# Patient Record
Sex: Female | Born: 1940 | Race: White | Hispanic: No | Marital: Married | Smoking: Never smoker
Health system: Southern US, Community
[De-identification: ages and names within clinical notes are randomized; demographics above are authoritative.]

## PROBLEM LIST (undated history)

## (undated) DIAGNOSIS — M1990 Unspecified osteoarthritis, unspecified site: Secondary | ICD-10-CM

## (undated) DIAGNOSIS — Z803 Family history of malignant neoplasm of breast: Secondary | ICD-10-CM

## (undated) DIAGNOSIS — R002 Palpitations: Secondary | ICD-10-CM

## (undated) DIAGNOSIS — Z923 Personal history of irradiation: Secondary | ICD-10-CM

## (undated) DIAGNOSIS — I499 Cardiac arrhythmia, unspecified: Secondary | ICD-10-CM

## (undated) DIAGNOSIS — Z8041 Family history of malignant neoplasm of ovary: Secondary | ICD-10-CM

## (undated) DIAGNOSIS — C50919 Malignant neoplasm of unspecified site of unspecified female breast: Secondary | ICD-10-CM

## (undated) DIAGNOSIS — I480 Paroxysmal atrial fibrillation: Secondary | ICD-10-CM

## (undated) DIAGNOSIS — I10 Essential (primary) hypertension: Secondary | ICD-10-CM

## (undated) DIAGNOSIS — I5189 Other ill-defined heart diseases: Secondary | ICD-10-CM

## (undated) HISTORY — DX: Essential (primary) hypertension: I10

## (undated) HISTORY — PX: DILATION AND CURETTAGE OF UTERUS: SHX78

## (undated) HISTORY — DX: Paroxysmal atrial fibrillation: I48.0

## (undated) HISTORY — PX: BREAST SURGERY: SHX581

## (undated) HISTORY — DX: Malignant neoplasm of unspecified site of unspecified female breast: C50.919

## (undated) HISTORY — DX: Family history of malignant neoplasm of ovary: Z80.41

## (undated) HISTORY — PX: EYE SURGERY: SHX253

## (undated) HISTORY — PX: BREAST EXCISIONAL BIOPSY: SUR124

## (undated) HISTORY — DX: Other ill-defined heart diseases: I51.89

## (undated) HISTORY — DX: Family history of malignant neoplasm of breast: Z80.3

## (undated) HISTORY — PX: SALPINGOOPHORECTOMY: SHX82

## (undated) HISTORY — DX: Palpitations: R00.2

---

## 1999-09-11 HISTORY — PX: BREAST LUMPECTOMY: SHX2

## 2003-09-17 ENCOUNTER — Emergency Department (HOSPITAL_COMMUNITY): Admission: EM | Admit: 2003-09-17 | Discharge: 2003-09-17 | Payer: Self-pay | Admitting: Emergency Medicine

## 2012-02-22 ENCOUNTER — Telehealth: Payer: Self-pay | Admitting: *Deleted

## 2012-02-22 NOTE — Telephone Encounter (Signed)
Received request from Colman Cater to schedule Ashlee Flores mom Ashlee Flores for genetics on 6/18 at 10:00.  Called Ashlee Flores and confirmed mother's information and confirmed 02/26/12 genetic appt.

## 2012-02-26 ENCOUNTER — Encounter: Payer: Self-pay | Admitting: Genetic Counselor

## 2012-02-26 ENCOUNTER — Ambulatory Visit (HOSPITAL_BASED_OUTPATIENT_CLINIC_OR_DEPARTMENT_OTHER): Payer: Medicare Other | Admitting: Genetic Counselor

## 2012-02-26 ENCOUNTER — Other Ambulatory Visit: Payer: Medicare Other | Admitting: Lab

## 2012-02-26 DIAGNOSIS — C50919 Malignant neoplasm of unspecified site of unspecified female breast: Secondary | ICD-10-CM

## 2012-02-26 DIAGNOSIS — Z808 Family history of malignant neoplasm of other organs or systems: Secondary | ICD-10-CM

## 2012-02-26 DIAGNOSIS — IMO0002 Reserved for concepts with insufficient information to code with codable children: Secondary | ICD-10-CM

## 2012-02-26 DIAGNOSIS — D0590 Unspecified type of carcinoma in situ of unspecified breast: Secondary | ICD-10-CM

## 2012-02-26 DIAGNOSIS — Z803 Family history of malignant neoplasm of breast: Secondary | ICD-10-CM

## 2012-02-26 NOTE — Progress Notes (Signed)
Dr.  Danna Hefty requested a consultation for genetic counseling and risk assessment for Ashlee Flores, a 71 y.o. female, for discussion of her personal and family history of cancer. She presents to clinic today, with her daughter Harriett Sine, to discuss the possibility of a genetic predisposition to cancer, and to further clarify her risks, as well as her family members' risks for cancer.   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 This was treated with lumpectomy and radiation.    History  Substance Use Topics  . Smoking status: Not on file  . Smokeless tobacco: Not on file  . Alcohol Use: Not on file    REPRODUCTIVE HISTORY AND PERSONAL RISK ASSESSMENT FACTORS: Menarche was at age 69.   Menopause was at 50  Uterus Intact: Yes Ovaries Intact: No, removed prophylactically because of family history [id number] , first live birth at age 30  She has not previously undergone treatment for infertility.   OCP use for 3 years   She has used HRT in the past.    FAMILY HISTORY:  We obtained a detailed, 4-generation family history.  Significant diagnoses are listed below: The patient was diagnosed with breast cancer at age 55. She reports being tested for BRCA mutations in 2002 and was negative. Her daughter was diagnosed with a melanoma at age 61, her sister was diagnosed with stage IV ovarian cancer at age 10, and her mother was diagnosed with breast cancer at age 79.  The patient reports that her sister was also tested for BRCA mutations around the same time she was and tested negative. A maternal aunt was diagnosed with breast cancer at age 64 and died at 53, and her son has been diagnosed with prostate cancer.  The patient's maternal grandmother was diagnosed with what they think was ovarian cancer and died at age 75.  Her brother had a daughter with breast cancer.  The patient's father had a heart attack at age 42 and died.  He had one  sister who had breast cancer in her 58s, and her daughter had breast cancer and died in her 64s.  He also had a brother who had bone cancer and died in his 11s.  There is no other reported cancer on either side of the family.  Patient's maternal ancestors are of [place] descent, and paternal ancestors are of [place] descent. There is no reported Ashkenazi Jewish ancestry. There is no known consanguinity.  GENETIC COUNSELING RISK ASSESSMENT, DISCUSSION, AND SUGGESTED FOLLOW UP: We reviewed the natural history and genetic etiology of sporadic, familial and hereditary cancer syndromes.  About 5-10% of breast cancer is hereditary.  Of this, about 85% is the result of a BRCA1 or BRCA2 mutation.  We reviewed the red flags of hereditary cancer syndromes and the dominant inheritance patterns.  We discussed that since 2002, new technology has allowed [place] to perform different testing of BRCA1 looking for 5 rearrangements in that gene, as well as a large rearrangement test called BART.  Retesting her would allow [place] to ensure that all updated testing was performed.  If the BRCA testing is negative, we discussed that we could be testing for the wrong gene.  We discussed gene panels, and that several cancer genes that are associated with different cancers can be tested at the same time.  Because of the different types of cancer that are in the patient's family, we will consider one of the panel tests  if she is negative for BRCA mutations.  The patient's personal and family history is suggestive of the following possible diagnosis: hereditary cancer syndrome  We discussed that identification of a hereditary cancer syndrome may help her care providers tailor the patients medical management. If a mutation indicating hereditary cancer syndrome is detected in this case, the [employer] recommendations would include increased cancer surveillance and possible prophylactic surgery. If a mutation is  detected, the patient will be referred back to the referring provider and to any additional appropriate care providers to discuss the relevant options.   If a mutation is not found in the patient, her family history is still suggestive of a hereditary cancer syndrome as the explanation for her breast cancer, we just do not have the technology to test for it. Cancer surveillance options would be discussed for the patient according to the appropriate standard National Comprehensive Cancer Network and American Cancer Society guidelines, with consideration of their personal and family history risk factors. In this case, the patient will be referred back to their care providers for discussions of management.   After considering the risks, benefits, and limitations, the patient provided informed consent for  the following  testing: BRACAnalysis through [facility] and CancerNext through [employer]   Per the patient's request, we will contact her by telephone to discuss these results. A follow up genetic counseling visit will be scheduled if indicated.  The patient was seen for a total of 60 minutes, greater than 50% of which was spent face-to-face counseling.  This plan is being carried out per Dr. Danna Hefty recommendations.  This note will also be sent to the referring provider via the electronic medical record. The patient will be supplied with a summary of this genetic counseling discussion as well as educational information on the discussed hereditary cancer syndromes following the conclusion of their visit.   Patient was discussed with Dr. Drue Second.  CC BY [place] MAIL: Dr. Ardelle Lesches, [address] [place], [place]  [address]     _______________________________________________________________________ For Office Staff:  Number of people involved in session: 3 Was an Intern/ student involved with case: not applicable

## 2012-03-11 ENCOUNTER — Encounter: Payer: Self-pay | Admitting: Genetic Counselor

## 2012-04-11 ENCOUNTER — Telehealth: Payer: Self-pay | Admitting: Oncology

## 2012-04-11 NOTE — Telephone Encounter (Signed)
Genetic results mailed out.  °

## 2012-06-17 ENCOUNTER — Telehealth: Payer: Self-pay | Admitting: Genetic Counselor

## 2012-06-17 NOTE — Telephone Encounter (Signed)
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  She agreed for me to submit information to [facility] for family studies.

## 2012-07-22 ENCOUNTER — Telehealth: Payer: Self-pay | Admitting: Genetic Counselor

## 2012-07-22 NOTE — Telephone Encounter (Signed)
Left message about family studies application and asked her to CB.

## 2013-02-11 ENCOUNTER — Encounter: Payer: Self-pay | Admitting: Genetic Counselor

## 2013-10-19 DIAGNOSIS — I10 Essential (primary) hypertension: Secondary | ICD-10-CM | POA: Diagnosis not present

## 2013-10-19 DIAGNOSIS — Z79899 Other long term (current) drug therapy: Secondary | ICD-10-CM | POA: Diagnosis not present

## 2013-10-22 DIAGNOSIS — Z853 Personal history of malignant neoplasm of breast: Secondary | ICD-10-CM | POA: Diagnosis not present

## 2013-10-26 DIAGNOSIS — M19049 Primary osteoarthritis, unspecified hand: Secondary | ICD-10-CM | POA: Diagnosis not present

## 2013-10-26 DIAGNOSIS — IMO0001 Reserved for inherently not codable concepts without codable children: Secondary | ICD-10-CM | POA: Diagnosis not present

## 2013-12-03 DIAGNOSIS — H2510 Age-related nuclear cataract, unspecified eye: Secondary | ICD-10-CM | POA: Diagnosis not present

## 2013-12-03 DIAGNOSIS — H40039 Anatomical narrow angle, unspecified eye: Secondary | ICD-10-CM | POA: Diagnosis not present

## 2013-12-14 DIAGNOSIS — M19049 Primary osteoarthritis, unspecified hand: Secondary | ICD-10-CM | POA: Diagnosis not present

## 2013-12-14 DIAGNOSIS — IMO0001 Reserved for inherently not codable concepts without codable children: Secondary | ICD-10-CM | POA: Diagnosis not present

## 2014-02-08 DIAGNOSIS — I10 Essential (primary) hypertension: Secondary | ICD-10-CM | POA: Diagnosis not present

## 2014-02-08 DIAGNOSIS — R7309 Other abnormal glucose: Secondary | ICD-10-CM | POA: Diagnosis not present

## 2014-02-08 DIAGNOSIS — M62838 Other muscle spasm: Secondary | ICD-10-CM | POA: Diagnosis not present

## 2014-03-08 DIAGNOSIS — M949 Disorder of cartilage, unspecified: Secondary | ICD-10-CM | POA: Diagnosis not present

## 2014-03-08 DIAGNOSIS — M899 Disorder of bone, unspecified: Secondary | ICD-10-CM | POA: Diagnosis not present

## 2014-06-07 DIAGNOSIS — Z124 Encounter for screening for malignant neoplasm of cervix: Secondary | ICD-10-CM | POA: Diagnosis not present

## 2014-06-07 DIAGNOSIS — Z01419 Encounter for gynecological examination (general) (routine) without abnormal findings: Secondary | ICD-10-CM | POA: Diagnosis not present

## 2014-08-17 DIAGNOSIS — M8580 Other specified disorders of bone density and structure, unspecified site: Secondary | ICD-10-CM | POA: Diagnosis not present

## 2014-08-17 DIAGNOSIS — R7309 Other abnormal glucose: Secondary | ICD-10-CM | POA: Diagnosis not present

## 2014-08-17 DIAGNOSIS — I10 Essential (primary) hypertension: Secondary | ICD-10-CM | POA: Diagnosis not present

## 2014-08-17 DIAGNOSIS — Z23 Encounter for immunization: Secondary | ICD-10-CM | POA: Diagnosis not present

## 2014-10-28 DIAGNOSIS — R197 Diarrhea, unspecified: Secondary | ICD-10-CM | POA: Diagnosis not present

## 2014-10-28 DIAGNOSIS — Z79899 Other long term (current) drug therapy: Secondary | ICD-10-CM | POA: Diagnosis not present

## 2014-10-28 DIAGNOSIS — I10 Essential (primary) hypertension: Secondary | ICD-10-CM | POA: Diagnosis not present

## 2014-11-26 DIAGNOSIS — Z808 Family history of malignant neoplasm of other organs or systems: Secondary | ICD-10-CM | POA: Diagnosis not present

## 2014-11-26 DIAGNOSIS — L814 Other melanin hyperpigmentation: Secondary | ICD-10-CM | POA: Diagnosis not present

## 2014-11-26 DIAGNOSIS — L821 Other seborrheic keratosis: Secondary | ICD-10-CM | POA: Diagnosis not present

## 2014-11-26 DIAGNOSIS — I8310 Varicose veins of unspecified lower extremity with inflammation: Secondary | ICD-10-CM | POA: Diagnosis not present

## 2014-11-26 DIAGNOSIS — D225 Melanocytic nevi of trunk: Secondary | ICD-10-CM | POA: Diagnosis not present

## 2014-11-26 DIAGNOSIS — L609 Nail disorder, unspecified: Secondary | ICD-10-CM | POA: Diagnosis not present

## 2014-11-26 DIAGNOSIS — L570 Actinic keratosis: Secondary | ICD-10-CM | POA: Diagnosis not present

## 2014-11-26 DIAGNOSIS — D2270 Melanocytic nevi of unspecified lower limb, including hip: Secondary | ICD-10-CM | POA: Diagnosis not present

## 2015-01-06 ENCOUNTER — Telehealth: Payer: Self-pay | Admitting: Genetic Counselor

## 2015-01-06 NOTE — Telephone Encounter (Signed)
Ashlee Flores LM on my VM stating that her sister who had ovarian cancer tested positive for a RAD51C mutation.  Reviewed Ashlee Flores's genetic testing and it was not tested for when she had her OvaNext genetic testing.  She will try to obtain a copy of her sister's test results. I called back today to follow up on this previous conversation.  I LM on VM asking that she call back.

## 2015-01-10 ENCOUNTER — Ambulatory Visit (HOSPITAL_BASED_OUTPATIENT_CLINIC_OR_DEPARTMENT_OTHER): Payer: Medicare Other | Admitting: Genetic Counselor

## 2015-01-10 ENCOUNTER — Other Ambulatory Visit: Payer: Medicare Other

## 2015-01-10 ENCOUNTER — Encounter: Payer: Self-pay | Admitting: Genetic Counselor

## 2015-01-10 DIAGNOSIS — Z315 Encounter for genetic counseling: Secondary | ICD-10-CM | POA: Diagnosis present

## 2015-01-10 DIAGNOSIS — Z803 Family history of malignant neoplasm of breast: Secondary | ICD-10-CM | POA: Diagnosis not present

## 2015-01-10 DIAGNOSIS — Z8041 Family history of malignant neoplasm of ovary: Secondary | ICD-10-CM | POA: Diagnosis not present

## 2015-01-10 DIAGNOSIS — Z853 Personal history of malignant neoplasm of breast: Secondary | ICD-10-CM

## 2015-01-10 DIAGNOSIS — C50919 Malignant neoplasm of unspecified site of unspecified female breast: Secondary | ICD-10-CM | POA: Insufficient documentation

## 2015-01-10 NOTE — Progress Notes (Signed)
REFERRING PROVIDER: Jonathon Jordan, MD  PRIMARY PROVIDER:  No primary care provider on file.  PRIMARY REASON FOR VISIT:  1. History of breast cancer   2. Family history of ovarian cancer   3. Family history of breast cancer      HISTORY OF PRESENT ILLNESS:   Ashlee Flores, a 74 y.o. female, was seen for a [facility] cancer genetics consultation at the request of Dr. Stephanie Acre due to a personal and family history of cancer.  Ashlee Flores presents to clinic today to discuss the possibility of a hereditary predisposition to cancer, genetic testing, and to further clarify her future cancer risks, as well as potential cancer risks for family members.   In 2001, at the age of 29, Ashlee Flores was diagnosed with DCIS of the breast. This was treated with lumpectomy and radiation.  She underwent genetic testing for BRCA mutations and was negative.  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  Her sister recently underwent a [facility] panel test and was found to have a RAD51D mutation.  This gene was not on the OvaNext panel test in 2013, although it is now.  Ashlee Flores wants to undergo targeted testing for the RAD51D mutation found in her sister.   CANCER HISTORY:   No history exists.     HORMONAL RISK FACTORS:  Menarche was at age 53.  First live birth at age 18.  OCP use for approximately 3 years.  Ovaries intact: no.  Hysterectomy: yes.  Menopausal status: postmenopausal.  HRT use: 1 years. Any excessive radiation exposure in the past:  Patient had radiation treatment for her previous breast cancer.  Past Medical History  Diagnosis Date  . Breast cancer     DCIS breast cancer at 76  . Family history of ovarian cancer   . Family history of breast cancer     Past Surgical History  Procedure Laterality Date  . Salpingoophorectomy      preventative, due to [place] of ovarian cancer    History   Social History  .  Marital Status: Married    Spouse Name: N/A  . Number of Children: 4  . Years of Education: N/A   Social History Main Topics  . Smoking status: Never Smoker   . Smokeless tobacco: Not on file  . Alcohol Use: Yes  . Drug Use: Not on file  . Sexual Activity: Not on file   Other Topics Concern  . None   Social History Narrative     FAMILY HISTORY:  We obtained a detailed, 4-generation family history.  Significant diagnoses are listed below: Family History  Problem Relation Age of Onset  . Breast cancer Mother 34  . Heart attack Father   . Ovarian cancer Sister 65  . Breast cancer Paternal Aunt     dx in her 14s  . Ovarian cancer Maternal Grandmother     died at 37  . Melanoma Daughter 32    on leg  . Breast cancer Cousin     dx in her 11s  . Breast cancer Maternal Aunt 65  . Heart attack Maternal Uncle   . Bone cancer Paternal Uncle   . Heart attack Maternal Grandfather   . Heart attack Maternal Aunt   . Prostate cancer Cousin 29   The patient's family history has not changed.  Ashlee Flores has four children, her daughter was dx with melanoma at 56.  Ashlee Flores sister was daignosed with ovarian cancer at 51.  Her mother was diagnosed with breast cancer at 50  Her mother had two sisters and a brother.  One sister had breast cancer at 3.  Ashlee Flores maternal grandmother had ovarian cancer and her grandfather's sister (who married her grandmother's brother) had breast cancer.  There is also cancer in the children of the great aunt and uncle.  Ashlee Flores father had three sisters and two brothers.  One sister had breast cancer in her 76s and had a daughter with breast cancer in her 26s.  One brother had bone cancer. Patient's maternal ancestors are of [place] descent, and paternal ancestors are of [place] descent. There is no reported Ashkenazi Jewish ancestry. There is no known consanguinity.  GENETIC COUNSELING ASSESSMENT: Ashlee Flores is a 74 y.o. 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 We, therefore, discussed and recommended the following at today's visit.   DISCUSSION: We reviewed the characteristics, features and inheritance patterns of hereditary cancer syndromes. We discussed that RAD51D is a known ovarian cancer syndrome gene, that increases the risk for ovarian cancer.  In some families we also see breast cancer, but we do not have specific risks for breast cancer. The NCCN guidelines were updated this year and have included RAD51D as a gene where mutations increase the risk enough to discuss risk reducing BSO in carriers.  Therefore, if Ashlee Flores tests positive we would have guidelines in order to base our recommendations upon.  Based on her sister's mutation, Ashlee Flores has a 50% chance of also testing positive for the RAD51D mutation.  We also discussed genetic testing, including the appropriate family members to test, the process of testing, insurance coverage and turn-around-time for results. Based on her insurance, it is unknown if they will cover targeted testing for the RAD51D mutation. We discussed the implications of a negative, positive and/or variant of uncertain significant result. We recommended Ashlee Flores pursue genetic testing for the familial RAD51D mutation called [id number].   PLAN: After considering the risks, benefits, and limitations, Ashlee Flores  provided informed consent to pursue genetic testing and the blood sample was sent to [facility] for analysis of the single site RAD51D familial mutation. Results should be available within approximately 7-10 days' time, at which point they will be disclosed by telephone to Ashlee Flores, as will any additional recommendations warranted by these results. Ashlee Flores will receive a summary of her genetic counseling visit and a copy of her  results once available. This information will also be available in Epic. We encouraged Ashlee Flores to remain in contact with cancer genetics annually so that we can continuously update the family history and inform her of any changes in cancer genetics and testing that may be of benefit for her family. Ms. Lafosse questions were answered to her satisfaction today. Our contact information was provided should additional questions or concerns arise.  Lastly, we encouraged Ms. Cotrell to remain in contact with cancer genetics annually so that we can continuously update the family history and inform her of any changes in cancer genetics and testing that may be of benefit for this family.   Ms.  Carreras questions were answered to her satisfaction today. Our contact information was provided should additional questions or concerns arise. Thank you for the referral and allowing [place] to share in the  care of your patient.   Merla Sawka P. Florene Glen, [place], [facility] Certified Genetic Counselor [email or web address] phone: [phone]  The patient was seen for a total of 30 minutes in face-to-face genetic counseling.  This patient was discussed with Drs. Magrinat, Lindi Adie and/or Burr Medico who agrees with the above.    _______________________________________________________________________ For Office Staff:  Number of people involved in session: 1 Was an Intern/ student involved with case: no

## 2015-01-21 ENCOUNTER — Encounter: Payer: Self-pay | Admitting: Genetic Counselor

## 2015-01-21 ENCOUNTER — Telehealth: Payer: Self-pay | Admitting: Genetic Counselor

## 2015-01-21 DIAGNOSIS — C50919 Malignant neoplasm of unspecified site of unspecified female breast: Secondary | ICD-10-CM

## 2015-01-21 DIAGNOSIS — Z803 Family history of malignant neoplasm of breast: Secondary | ICD-10-CM

## 2015-01-21 DIAGNOSIS — Z1379 Encounter for other screening for genetic and chromosomal anomalies: Secondary | ICD-10-CM | POA: Insufficient documentation

## 2015-01-21 DIAGNOSIS — Z8041 Family history of malignant neoplasm of ovary: Secondary | ICD-10-CM

## 2015-01-21 NOTE — Telephone Encounter (Signed)
Revealed negative genetic testing on the targeted RAD51D mutation.  Explained that she did not have the mutation found in her sister.  Patient voiced her understanding.

## 2015-01-21 NOTE — Progress Notes (Signed)
HPI: Ashlee Flores was previously seen in the [facility] clinic due to a personal and family history of cancer and concerns regarding a hereditary predisposition to cancer. Please refer to our prior cancer genetics clinic note for more information regarding Ashlee Flores's medical, social and family histories, and our assessment and recommendations, at the time. Ashlee Flores recent genetic test results were disclosed to her, as were recommendations warranted by these results. These results and recommendations are discussed in more detail below.  GENETIC TEST RESULTS: We recommended Ashlee Flores pursue testing for the familial hereditary cancer gene mutation called RAD51D, c.[id number]. Ashlee Flores test was normal and did not reveal the familial mutation. We call this result a true negative result because the cancer-causing mutation was identified in Ashlee Flores's family, and she did not inherit it.  Given this negative result, Ashlee Flores chances of developing breast/ovarian-related cancers are the same as they are in the general population.    ADDITIONAL GENETIC TESTING: We discussed with Ashlee Flores that there are other genes that are associated with increased cancer risk that can be analyzed. In 2013 she had large panel testing through [facility] called OvaNext.  At that time, the OvaNext gene panel offered by [facility] and includes sequencing and rearrangement analysis for the following 21 genes: ATM, BARD1, BRCA1, BRCA2, BRIP1, CDH1, CHEK2, EPCAM, MLH1, MRE11A, MSH2, MSH6, MUTYH, NBN, PALB2, PMS2, PTEN, RAD50, RAD51C, STK11, and TP53.    CANCER SCREENING RECOMMENDATIONS:This result is reassuring and indicates that Ashlee Flores likely does not have an increased risk for a future cancer due to a mutation in one of these genes. This normal test also suggests that Ashlee Flores cancer was most likely not due to an inherited predisposition associated with one of these genes.  Most cancers  happen by chance and this negative test suggests that her cancer falls into this category.  We, therefore, recommended she continue to follow the cancer management and screening guidelines provided by her oncology and primary healthcare provider.   RECOMMENDATIONS FOR FAMILY MEMBERS: Women in this family might be at some increased risk of developing cancer, over the general population risk, simply due to the family history of cancer. We recommended women in this family have a yearly mammogram beginning at age 60, or 25 years younger than the earliest onset of cancer, an an annual clinical breast exam, and perform monthly breast self-exams. Women in this family should also have a gynecological exam as recommended by their primary provider. All family members should have a colonoscopy by age 17.  FOLLOW-UP: Lastly, we discussed with Ashlee Flores that cancer genetics is a rapidly advancing field and it is possible that new genetic tests will be appropriate for her and/or her family members in the future. We encouraged her to remain in contact with cancer genetics on an annual basis so we can update her personal and family histories and let her know of advances in cancer genetics that may benefit this family.   Our contact number was provided. Ashlee Flores questions were answered to her satisfaction, and she knows she is welcome to call us at anytime with additional questions or concerns.   Roma Kayser, MS, [facility] Certified Genetic Counselor [email or web address]

## 2015-02-08 ENCOUNTER — Telehealth: Payer: Self-pay

## 2015-02-08 NOTE — Telephone Encounter (Signed)
Genetic results dtd 01/21/15 rcvd from [facility].  Reviewed by Dr. Lindi Adie.  Sent to scan.

## 2015-02-24 DIAGNOSIS — R109 Unspecified abdominal pain: Secondary | ICD-10-CM | POA: Diagnosis not present

## 2015-02-24 DIAGNOSIS — R197 Diarrhea, unspecified: Secondary | ICD-10-CM | POA: Diagnosis not present

## 2015-02-24 DIAGNOSIS — Z8041 Family history of malignant neoplasm of ovary: Secondary | ICD-10-CM | POA: Diagnosis not present

## 2015-03-01 DIAGNOSIS — Z1211 Encounter for screening for malignant neoplasm of colon: Secondary | ICD-10-CM | POA: Diagnosis not present

## 2015-03-07 ENCOUNTER — Other Ambulatory Visit: Payer: Self-pay | Admitting: Family Medicine

## 2015-03-07 DIAGNOSIS — R1084 Generalized abdominal pain: Secondary | ICD-10-CM

## 2015-03-22 ENCOUNTER — Ambulatory Visit
Admission: RE | Admit: 2015-03-22 | Discharge: 2015-03-22 | Disposition: A | Discharge status: Home / Self Care | Payer: Medicare Other | Source: Ambulatory Visit | Attending: Family Medicine | Admitting: Family Medicine

## 2015-03-22 DIAGNOSIS — R1084 Generalized abdominal pain: Secondary | ICD-10-CM

## 2015-03-22 DIAGNOSIS — Z853 Personal history of malignant neoplasm of breast: Secondary | ICD-10-CM | POA: Diagnosis not present

## 2015-03-22 DIAGNOSIS — R109 Unspecified abdominal pain: Secondary | ICD-10-CM | POA: Diagnosis not present

## 2015-03-22 MED ORDER — IOPAMIDOL (ISOVUE-300) INJECTION 61%
100.0000 mL | Freq: Once | INTRAVENOUS | Status: AC | PRN
  Administered 2015-03-22: 100 mL via INTRAVENOUS

## 2015-06-03 DIAGNOSIS — R197 Diarrhea, unspecified: Secondary | ICD-10-CM | POA: Diagnosis not present

## 2015-06-03 DIAGNOSIS — R194 Change in bowel habit: Secondary | ICD-10-CM | POA: Diagnosis not present

## 2015-06-09 ENCOUNTER — Other Ambulatory Visit: Payer: Self-pay | Admitting: Obstetrics & Gynecology

## 2015-06-09 ENCOUNTER — Other Ambulatory Visit (HOSPITAL_COMMUNITY)
Admission: RE | Admit: 2015-06-09 | Discharge: 2015-06-09 | Disposition: A | Discharge status: Home / Self Care | Payer: Medicare Other | Source: Ambulatory Visit | Attending: Obstetrics & Gynecology | Admitting: Obstetrics & Gynecology

## 2015-06-09 DIAGNOSIS — A630 Anogenital (venereal) warts: Secondary | ICD-10-CM | POA: Diagnosis not present

## 2015-06-09 DIAGNOSIS — F418 Other specified anxiety disorders: Secondary | ICD-10-CM | POA: Diagnosis not present

## 2015-06-09 DIAGNOSIS — R87810 Cervical high risk human papillomavirus (HPV) DNA test positive: Secondary | ICD-10-CM | POA: Insufficient documentation

## 2015-06-09 DIAGNOSIS — Z1151 Encounter for screening for human papillomavirus (HPV): Secondary | ICD-10-CM | POA: Diagnosis present

## 2015-06-09 DIAGNOSIS — Z1371 Encounter for nonprocreative screening for genetic disease carrier status: Secondary | ICD-10-CM | POA: Diagnosis not present

## 2015-06-09 DIAGNOSIS — R87619 Unspecified abnormal cytological findings in specimens from cervix uteri: Secondary | ICD-10-CM | POA: Diagnosis present

## 2015-06-09 DIAGNOSIS — Z9189 Other specified personal risk factors, not elsewhere classified: Secondary | ICD-10-CM | POA: Diagnosis not present

## 2015-06-09 DIAGNOSIS — Z90722 Acquired absence of ovaries, bilateral: Secondary | ICD-10-CM | POA: Diagnosis not present

## 2015-06-09 DIAGNOSIS — Z853 Personal history of malignant neoplasm of breast: Secondary | ICD-10-CM | POA: Diagnosis not present

## 2015-06-10 LAB — CYTOLOGY - PAP

## 2015-07-12 DIAGNOSIS — Z23 Encounter for immunization: Secondary | ICD-10-CM | POA: Diagnosis not present

## 2015-07-14 ENCOUNTER — Other Ambulatory Visit: Payer: Self-pay | Admitting: Gastroenterology

## 2015-07-14 DIAGNOSIS — K5520 Angiodysplasia of colon without hemorrhage: Secondary | ICD-10-CM | POA: Diagnosis not present

## 2015-07-14 DIAGNOSIS — K640 First degree hemorrhoids: Secondary | ICD-10-CM | POA: Diagnosis not present

## 2015-07-14 DIAGNOSIS — K5730 Diverticulosis of large intestine without perforation or abscess without bleeding: Secondary | ICD-10-CM | POA: Diagnosis not present

## 2015-07-14 DIAGNOSIS — R197 Diarrhea, unspecified: Secondary | ICD-10-CM | POA: Diagnosis not present

## 2015-10-07 DIAGNOSIS — I10 Essential (primary) hypertension: Secondary | ICD-10-CM | POA: Diagnosis not present

## 2015-10-07 DIAGNOSIS — K760 Fatty (change of) liver, not elsewhere classified: Secondary | ICD-10-CM | POA: Diagnosis not present

## 2015-11-29 DIAGNOSIS — D1801 Hemangioma of skin and subcutaneous tissue: Secondary | ICD-10-CM | POA: Diagnosis not present

## 2015-11-29 DIAGNOSIS — Z808 Family history of malignant neoplasm of other organs or systems: Secondary | ICD-10-CM | POA: Diagnosis not present

## 2015-11-29 DIAGNOSIS — L821 Other seborrheic keratosis: Secondary | ICD-10-CM | POA: Diagnosis not present

## 2015-11-29 DIAGNOSIS — D2270 Melanocytic nevi of unspecified lower limb, including hip: Secondary | ICD-10-CM | POA: Diagnosis not present

## 2015-11-29 DIAGNOSIS — D225 Melanocytic nevi of trunk: Secondary | ICD-10-CM | POA: Diagnosis not present

## 2015-11-29 DIAGNOSIS — L814 Other melanin hyperpigmentation: Secondary | ICD-10-CM | POA: Diagnosis not present

## 2015-11-29 DIAGNOSIS — Z23 Encounter for immunization: Secondary | ICD-10-CM | POA: Diagnosis not present

## 2016-01-02 DIAGNOSIS — I10 Essential (primary) hypertension: Secondary | ICD-10-CM | POA: Diagnosis not present

## 2016-04-16 DIAGNOSIS — I499 Cardiac arrhythmia, unspecified: Secondary | ICD-10-CM | POA: Diagnosis not present

## 2016-04-25 ENCOUNTER — Telehealth: Payer: Self-pay | Admitting: Cardiovascular Disease

## 2016-04-25 NOTE — Telephone Encounter (Signed)
Received records from [place] for appointment on 06/04/16 with Dr Oval Linsey.  Records given to [facility] (medical records) for Dr Blenda Mounts schedule on 06/04/16. lp

## 2016-06-01 ENCOUNTER — Encounter: Payer: Self-pay | Admitting: Cardiovascular Disease

## 2016-06-02 NOTE — Progress Notes (Signed)
Cardiology Office Note   Date:  06/04/2016   ID:  Ashlee Flores, DOB 17-Feb-1941, MRN [id number]  PCP:  No primary care provider on file.  Cardiologist:   Skeet Latch, MD   Chief Complaint  Patient presents with  . New Patient (Initial Visit)    cramping in legs at night. Pt experienced rapid heartbeat w/ elevated BP back in July.    History of Present Illness: Ashlee Flores is a 75 y.o. female with hypertension who presents for an evaluation of palpitations. Ashlee Flores saw Ashlee Flores on 04/16/16. At that time she reported an episode of irregular heart rate that occurred while resting.  She was watching television at the end of July when she developed a sudden onset of irregular heart rhythm. She checked her blood pressure and heart rate repeatedly and her heart rate ranged from the 70s to 1:15. Her blood pressure ranged from 92-128/67-103. There is no associated lightheadedness, dizziness, chest pain, or shortness of breath.  This episode persisted for several hours and she was better until it subsided. At the follow-up appointment with Ashlee Flores her EKG was reportedly unremarkable. Laboratory testing revealed normal blood counts.  A comprehensive metabolic panel and thyroid function were also checked, but these labs are not available at this time.  She endorses shortness episodes of irregular heart rhythms and has none that lasted this long.   Ashlee Flores is very physically active.  She exercises by walking 2 miles most days of the week. She also participates in [place] and tai chi. She has no exertional chest pain or shortness of breath with these activities. She has not noted any lower extremity edema, orthopnea, or PND. She drinks one cup of coffee daily. She notes that on the day of her episode she had 2 or 3 glasses of wine instead of her typical one glass that evening.  She has been under some stress lately, as she recently remarried after being married for 46 years.  She  notes that her blood pressure is typically well-controlled.  Past Medical History:  Diagnosis Date  . 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 Essential hypertension 06/04/2016  . Family history of breast cancer   . Family history of ovarian cancer   . Palpitations 06/04/2016    Past Surgical History:  Procedure Laterality Date  . SALPINGOOPHORECTOMY     preventative, due to [place] of ovarian cancer     Current Outpatient Prescriptions  Medication Sig Dispense Refill  . hydrochlorothiazide (HYDRODIURIL) 25 MG tablet     . lisinopril (PRINIVIL,ZESTRIL) 10 MG tablet      No current facility-administered medications for this visit.     Allergies:   Penicillins    Social History:  The patient  reports that she has never smoked. She has never used smokeless tobacco. She reports that she drinks alcohol. She reports that she does not use drugs.   Family History:  The patient's family history includes Bone cancer in her paternal uncle; Breast cancer in her cousin and paternal aunt; Breast cancer (age of onset: 24) in her maternal aunt; Breast cancer (age of onset: 83) in her mother; Heart attack in her father, maternal aunt, maternal grandfather, and maternal uncle; Hypertension in her mother; Melanoma (age of onset: 67) in her daughter; Ovarian cancer in her maternal grandmother; Ovarian cancer (age of onset: 71) in her sister; Prostate cancer (age of onset: 23) in her cousin.    ROS:  Please see the history of present illness.   Otherwise, review of systems are positive for none.   All other systems are reviewed and negative.    PHYSICAL EXAM: VS:  BP (!) 144/74   Pulse (!) 58   Ht _0  (1.6 m)   Wt 130 lb 3.2 oz (59.1 kg)   BMI 23.06 kg/m  , BMI Body mass index is 23.06 kg/m. GENERAL:  Well appearing HEENT:  Pupils equal round and reactive, fundi not visualized, oral mucosa unremarkable NECK:  No jugular venous distention, waveform within normal limits, carotid  upstroke brisk and symmetric, no bruits, no thyromegaly LYMPHATICS:  No cervical adenopathy LUNGS:  Clear to auscultation bilaterally HEART:  RRR.  PMI not displaced or sustained,S1 and S2 within normal limits, no S3, no S4, no clicks, no rubs, no murmurs ABD:  Flat, positive bowel sounds normal in frequency in pitch, no bruits, no rebound, no guarding, no midline pulsatile mass, no hepatomegaly, no splenomegaly EXT:  2 plus pulses throughout, no edema, no cyanosis no clubbing SKIN:  No rashes no nodules NEURO:  Cranial nerves II through XII grossly intact, motor grossly intact throughout PSYCH:  Cognitively intact, oriented to person place and time    EKG:  EKG is ordered today. The ekg ordered today demonstrates sinus bradycardia rate 58 bpm.     Recent Labs: No results found for requested labs within last 8760 hours.   04/16/16: WBC 4.6, hemoglobin 14.6, hematocrit 42, platelets 212  Lipid Panel No results found for: CHOL, TRIG, HDL, CHOLHDL, VLDL, LDLCALC, LDLDIRECT    Wt Readings from Last 3 Encounters:  06/04/16 130 lb 3.2 oz (59.1 kg)      ASSESSMENT AND PLAN:  # Palpitations: Ms. Macchia reports one evening of palpitations.  Given her report that her heart rate changed Drastically and rapidly from the 60s to the 120s, it is concerning that she may have had atrial fibrillation. She has not had any significant episodes since that time. We will obtain a 30 day event monitor to better evaluate her symptoms. For now, she will continue taking aspirin 81 mg daily. Her heart rate is too low to be on any nodal agents.  She is getting ready to go on an international trip and will return October 12. She will wear her monitor after that time.  Thyroid function and basic metabolic panel will reportedly normal with Ashlee Flores.  # Hypertension: Blood pressure slightly above goal today, but has been well-controlled and even low at home. Therefore, we will not make any changes at this  time.  Current medicines are reviewed at length with the patient today.  The patient does not have concerns regarding medicines.  The following changes have been made:  no change  Labs/ tests ordered today include: Don't think so   Orders Placed This Encounter  Procedures  . Cardiac event monitor  . EKG 12-Lead     Disposition:   FU with Ashlee Antonson C. Oval Linsey, MD, [facility] in 2 months    This note was written with the assistance of speech recognition software.  Please excuse any transcriptional errors.  Signed, Ashlee Wedekind C. Oval Linsey, MD, [facility]  06/04/2016 9:04 AM    [place]

## 2016-06-04 ENCOUNTER — Ambulatory Visit (INDEPENDENT_AMBULATORY_CARE_PROVIDER_SITE_OTHER): Payer: Medicare Other | Admitting: Cardiovascular Disease

## 2016-06-04 ENCOUNTER — Encounter: Payer: Self-pay | Admitting: Cardiovascular Disease

## 2016-06-04 VITALS — BP 144/74 | HR 58 | Ht 63.0 in | Wt 130.2 lb

## 2016-06-04 DIAGNOSIS — I10 Essential (primary) hypertension: Secondary | ICD-10-CM

## 2016-06-04 DIAGNOSIS — R002 Palpitations: Secondary | ICD-10-CM | POA: Diagnosis not present

## 2016-06-04 DIAGNOSIS — I499 Cardiac arrhythmia, unspecified: Secondary | ICD-10-CM | POA: Diagnosis not present

## 2016-06-04 HISTORY — DX: Essential (primary) hypertension: I10

## 2016-06-04 HISTORY — DX: Palpitations: R00.2

## 2016-06-04 NOTE — Patient Instructions (Addendum)
Medication Instructions:  Your physician recommends that you continue on your current medications as directed. Please refer to the Current Medication list given to you today.  Labwork: none  Testing/Procedures: Your physician has recommended that you wear an event monitor. Event monitors are medical devices that record the heart's electrical activity. Doctors most often [place] these monitors to diagnose arrhythmias. Arrhythmias are problems with the speed or rhythm of the heartbeat. The monitor is a small, portable device. You can wear one while you do your normal daily activities. This is usually used to diagnose what is causing palpitations/syncope (passing out). 30 day  Follow-Up: Your physician recommends that you schedule a follow-up appointment in: 2 month ov  If you need a refill on your cardiac medications before your next appointment, please call your pharmacy.

## 2016-06-12 ENCOUNTER — Other Ambulatory Visit (HOSPITAL_COMMUNITY)
Admission: RE | Admit: 2016-06-12 | Discharge: 2016-06-12 | Disposition: A | Discharge status: Home / Self Care | Payer: Medicare Other | Source: Ambulatory Visit | Attending: Obstetrics and Gynecology | Admitting: Obstetrics and Gynecology

## 2016-06-12 ENCOUNTER — Other Ambulatory Visit: Payer: Self-pay | Admitting: Obstetrics & Gynecology

## 2016-06-12 DIAGNOSIS — B977 Papillomavirus as the cause of diseases classified elsewhere: Secondary | ICD-10-CM | POA: Diagnosis not present

## 2016-06-12 DIAGNOSIS — Z124 Encounter for screening for malignant neoplasm of cervix: Secondary | ICD-10-CM | POA: Insufficient documentation

## 2016-06-14 LAB — CYTOLOGY - PAP

## 2016-07-12 ENCOUNTER — Other Ambulatory Visit: Payer: Self-pay | Admitting: Cardiovascular Disease

## 2016-07-12 ENCOUNTER — Ambulatory Visit (INDEPENDENT_AMBULATORY_CARE_PROVIDER_SITE_OTHER): Payer: Medicare Other

## 2016-07-12 DIAGNOSIS — I499 Cardiac arrhythmia, unspecified: Secondary | ICD-10-CM

## 2016-07-12 DIAGNOSIS — R002 Palpitations: Secondary | ICD-10-CM

## 2016-07-13 ENCOUNTER — Other Ambulatory Visit: Payer: Self-pay | Admitting: Obstetrics & Gynecology

## 2016-07-13 DIAGNOSIS — Z853 Personal history of malignant neoplasm of breast: Secondary | ICD-10-CM

## 2016-07-30 ENCOUNTER — Ambulatory Visit
Admission: RE | Admit: 2016-07-30 | Discharge: 2016-07-30 | Disposition: A | Discharge status: Home / Self Care | Payer: Medicare Other | Source: Ambulatory Visit | Attending: Obstetrics & Gynecology | Admitting: Obstetrics & Gynecology

## 2016-07-30 ENCOUNTER — Other Ambulatory Visit: Payer: Self-pay | Admitting: Obstetrics & Gynecology

## 2016-07-30 DIAGNOSIS — Z1231 Encounter for screening mammogram for malignant neoplasm of breast: Secondary | ICD-10-CM

## 2016-07-30 DIAGNOSIS — Z853 Personal history of malignant neoplasm of breast: Secondary | ICD-10-CM

## 2016-08-21 NOTE — Progress Notes (Signed)
Cardiology Office Note   Date:  08/22/2016   ID:  Ashlee Flores, DOB [birth year], MRN [id number]  PCP:  Lilian Coma, MD  Cardiologist:   Skeet Latch, MD   No chief complaint on file.   History of Present Illness: Ashlee Flores is a 75 y.o. female with paroxysmal atrial fibrillation and hypertension who presents for follow-up. Ms. Klugh saw Dr. Jonathon Jordan on 04/16/16. At that time she reported an episode of irregular heart rate that occurred while resting.  Her symptoms were concerning for atrial fibrillation. Thyroid function and basic metabolic panel were normal. She wore a 30 day event monitor 07/2016 that did show atrial fibrillation with rates up to 160 bpm.  since that time she continues to have palpitations approximately once per month. The episodes last for several hours at a time. In between these episodes she feels completely well. She denies any chest pain or shortness of breath at rest or with physical activity. She denies lower extremity edema, orthopnea, or PND. She does not drink much caffeine. She continues to be very physically active and exercises regularly. She continues also feel stressed by her new marriage after being married previously for over 85 years.   Past Medical History:  Diagnosis Date  . 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 Essential hypertension 06/04/2016  . Family history of breast cancer   . Family history of ovarian cancer   . Palpitations 06/04/2016  . Paroxysmal atrial fibrillation ([place]) 08/22/2016    Past Surgical History:  Procedure Laterality Date  . SALPINGOOPHORECTOMY     preventative, due to [place] of ovarian cancer     Current Outpatient Prescriptions  Medication Sig Dispense Refill  . apixaban (ELIQUIS) 5 MG TABS tablet Take 1 tablet (5 mg total) by mouth 2 (two) times daily. 60 tablet 5  . hydrochlorothiazide (HYDRODIURIL) 25 MG tablet Take 25 mg by mouth daily.     Marland Kitchen lisinopril (PRINIVIL,ZESTRIL) 10 MG  tablet Take 10 mg by mouth daily.     . metoprolol tartrate (LOPRESSOR) 25 MG tablet 1/2 TABLET AS NEEDED AT ONSET OF AFIB, OK TO TAKE ADDITIONAL 1/2 TAB IF HEART RATE STILL ELEVATED AFTER A COUPLE OF HOURS 30 tablet 5   No current facility-administered medications for this visit.     Allergies:   Penicillins    Social History:  The patient  reports that she has never smoked. She has never used smokeless tobacco. She reports that she drinks alcohol. She reports that she does not use drugs.   Family History:  The patient's family history includes Bone cancer in her paternal uncle; Breast cancer in her cousin, maternal grandmother, and paternal aunt; Breast cancer (age of onset: 75) in her maternal aunt; Breast cancer (age of onset: 19) in her mother; Heart attack in her father, maternal aunt, maternal grandfather, and maternal uncle; Hypertension in her mother; Melanoma (age of onset: 35) in her daughter; Ovarian cancer in her maternal grandmother; Ovarian cancer (age of onset: 64) in her sister; Prostate cancer (age of onset: 67) in her cousin.    ROS:  Please see the history of present illness.   Otherwise, review of systems are positive for none.   All other systems are reviewed and negative.    PHYSICAL EXAM: VS:  BP 120/72   Pulse (!) 52   Ht 5\' 4"  (1.626 m)   Wt 60.4 kg (133 lb 3.2 oz)   LMP  (LMP Unknown)  BMI 22.86 kg/m  , BMI Body mass index is 22.86 kg/m. GENERAL:  Well appearing HEENT:  Pupils equal round and reactive, fundi not visualized, oral mucosa unremarkable NECK:  No jugular venous distention, waveform within normal limits, carotid upstroke brisk and symmetric, no bruits, no thyromegaly LYMPHATICS:  No cervical adenopathy LUNGS:  Clear to auscultation bilaterally HEART:  Regular rhythm. Cardiac. PMI not displaced or sustained,S1 and S2 within normal limits, no S3, no S4, no clicks, no rubs, no murmurs ABD:  Flat, positive bowel sounds normal in frequency in pitch,  no bruits, no rebound, no guarding, no midline pulsatile mass, no hepatomegaly, no splenomegaly EXT:  2 plus pulses throughout, no edema, no cyanosis no clubbing SKIN:  No rashes no nodules NEURO:  Cranial nerves II through XII grossly intact, motor grossly intact throughout PSYCH:  Cognitively intact, oriented to person place and time   EKG:  EKG is not ordered today. The ekg ordered 06/04/16 demonstrates sinus bradycardia rate 58 bpm.    30 Day Event Monitor 08/22/16:  Quality: Fair.  Baseline artifact. Predominant rhythm: Sinus bradycardia  Max heart rate: 160 bpm Min heart rate: 45 bpm  Atrial fibrillation was noted with rates up to 160 bpm. Recent Labs: No results found for requested labs within last 8760 hours.   04/16/16: WBC 4.6, hemoglobin 14.6, hematocrit 42, platelets 212  Lipid Panel No results found for: CHOL, TRIG, HDL, CHOLHDL, VLDL, LDLCALC, LDLDIRECT    Wt Readings from Last 3 Encounters:  08/22/16 60.4 kg (133 lb 3.2 oz)  06/04/16 59.1 kg (130 lb 3.2 oz)      ASSESSMENT AND PLAN:  # Atrial fibrillation:  Ms. Plummer was noted to have atrial fibrillation on her event monitor. She is very symptomatic with these episodes. Given that she also has bradycardia when in sinus rhythm, we will not start a daily nodal agent.  We will start metoprolol as needed. If she has bradycardia after using metoprolol or does not tolerate it we will consider starting an antiarrhythmic in a pill in the pocket approach. We will start Eliquis 5mg  bid for anticoagulation. She will be referred for an echocardiogram to ensure that she does not have any underlying structural heart disease.  We will also check a CBC, basic metabolic panel, magnesium, TSH, and free T4.  This patients CHA2DS2-VASc Score and unadjusted Ischemic Stroke Rate (% per year) is equal to 3.2 % stroke rate/year from a score of 3  Above score calculated as 1 point each if present [CHF, HTN, DM, Vascular=MI/PAD/Aortic  Plaque, Age if 65-74, or Female] Above score calculated as 2 points each if present [Age > 75, or Stroke/TIA/TE]   # Hypertension: Blood pressure is well-controlled today. Continue hydrochlorothiazide and lisinopril.  Current medicines are reviewed at length with the patient today.  The patient does not have concerns regarding medicines.  The following changes have been made:  no change  Labs/ tests ordered today include: Don't think so   Orders Placed This Encounter  Procedures  . T4, free  . TSH  . CBC with Differential/Platelet  . Magnesium  . Basic metabolic panel  . ECHOCARDIOGRAM COMPLETE     Disposition:   FU with Margarita Bobrowski C. Oval Linsey, MD, [facility] in 2 months    This note was written with the assistance of speech recognition software.  Please excuse any transcriptional errors.  Signed, Tamula Morrical C. Oval Linsey, MD, [facility]  08/22/2016 5:19 PM    [place]

## 2016-08-22 ENCOUNTER — Encounter: Payer: Self-pay | Admitting: Cardiovascular Disease

## 2016-08-22 ENCOUNTER — Ambulatory Visit (INDEPENDENT_AMBULATORY_CARE_PROVIDER_SITE_OTHER): Payer: Medicare Other | Admitting: Cardiovascular Disease

## 2016-08-22 VITALS — BP 120/72 | HR 52 | Ht 64.0 in | Wt 133.2 lb

## 2016-08-22 DIAGNOSIS — I10 Essential (primary) hypertension: Secondary | ICD-10-CM | POA: Diagnosis not present

## 2016-08-22 DIAGNOSIS — I480 Paroxysmal atrial fibrillation: Secondary | ICD-10-CM | POA: Diagnosis not present

## 2016-08-22 HISTORY — DX: Paroxysmal atrial fibrillation: I48.0

## 2016-08-22 LAB — CBC WITH DIFFERENTIAL/PLATELET
Basophils Absolute: 0 cells/uL (ref 0–200)
Basophils Relative: 0 %
Eosinophils Absolute: 147 cells/uL (ref 15–500)
Eosinophils Relative: 3 %
HCT: 39.8 % (ref 35.0–45.0)
Hemoglobin: 13.8 g/dL (ref 11.7–15.5)
Lymphocytes Relative: 32 %
Lymphs Abs: 1568 cells/uL (ref 850–3900)
MCH: 31.7 pg (ref 27.0–33.0)
MCHC: 34.7 g/dL (ref 32.0–36.0)
MCV: 91.3 fL (ref 80.0–100.0)
MPV: 8.8 fL (ref 7.5–12.5)
Monocytes Absolute: 441 cells/uL (ref 200–950)
Monocytes Relative: 9 %
Neutro Abs: 2744 cells/uL (ref 1500–7800)
Neutrophils Relative %: 56 %
Platelets: 209 10*3/uL (ref 140–400)
RBC: 4.36 MIL/uL (ref 3.80–5.10)
RDW: 14 % (ref 11.0–15.0)
WBC: 4.9 10*3/uL (ref 3.8–10.8)

## 2016-08-22 LAB — BASIC METABOLIC PANEL
BUN: 11 mg/dL (ref 7–25)
CO2: 24 mmol/L (ref 20–31)
Calcium: 9.9 mg/dL (ref 8.6–10.4)
Chloride: 102 mmol/L (ref 98–110)
Creat: 0.65 mg/dL (ref 0.60–0.93)
Glucose, Bld: 103 mg/dL — ABNORMAL HIGH (ref 65–99)
Potassium: 3.9 mmol/L (ref 3.5–5.3)
Sodium: 143 mmol/L (ref 135–146)

## 2016-08-22 LAB — TSH: TSH: 1.82 mIU/L

## 2016-08-22 LAB — T4, FREE: Free T4: 1.1 ng/dL (ref 0.8–1.8)

## 2016-08-22 MED ORDER — METOPROLOL TARTRATE 25 MG PO TABS: ORAL_TABLET | ORAL | 5 refills | Status: DC

## 2016-08-22 MED ORDER — APIXABAN 5 MG PO TABS: 5.0000 mg | ORAL_TABLET | Freq: Two times a day (BID) | ORAL | 5 refills | Status: DC

## 2016-08-22 NOTE — Patient Instructions (Signed)
Medication Instructions:  START ELIQUIS 5 MG TWICE A DAY  START METOPROLOL 12.5 MG AS NEEDED FOR AFIB. IF HEART RATE STILL ELEVATED AFTER A COUPLE OF HOURS OK TO TAKE EXTRA 12.5 MG   Labwork: TSH/FT4/CBC/MAGNESIUM/BMET AT [facility] ON THE FIRST FLOOR   Testing/Procedures: Your physician has requested that you have an echocardiogram. Echocardiography is a painless test that uses sound waves to create images of your heart. It provides your doctor with information about the size and shape of your heart and how well your heart's chambers and valves are working. This procedure takes approximately one hour. There are no restrictions for this procedure. [address]  Follow-Up: Your physician recommends that you schedule a follow-up appointment in: 2 MONTH OV  If you need a refill on your cardiac medications before your next appointment, please call your pharmacy.

## 2016-08-23 LAB — MAGNESIUM: Magnesium: 1.9 mg/dL (ref 1.5–2.5)

## 2016-08-24 ENCOUNTER — Telehealth: Payer: Self-pay | Admitting: Cardiovascular Disease

## 2016-08-24 NOTE — Telephone Encounter (Signed)
New Message  Pt voiced wanting to know if she needs to stop taking the baby aspirin.  Please f/u with pt

## 2016-08-24 NOTE — Telephone Encounter (Signed)
Patient has no known CAD, advised to stop ASA Apologized to patient that was not reported at visit but were unaware she was taking ASA

## 2016-08-24 NOTE — Telephone Encounter (Signed)
Returned call to patient-patient unsure if she needs to continue ASA since beginning Eliquis on 12/13.  Advised I would verify with MD Oval Linsey and follow up.  Pt verbalized understanding.    Routed to MD [facility].

## 2016-09-17 ENCOUNTER — Ambulatory Visit (HOSPITAL_COMMUNITY): Payer: Medicare Other | Attending: Cardiology

## 2016-09-17 ENCOUNTER — Other Ambulatory Visit: Payer: Self-pay

## 2016-09-17 ENCOUNTER — Other Ambulatory Visit (HOSPITAL_COMMUNITY): Payer: Medicare Other

## 2016-09-17 ENCOUNTER — Telehealth: Payer: Self-pay | Admitting: Cardiovascular Disease

## 2016-09-17 DIAGNOSIS — I272 Pulmonary hypertension, unspecified: Secondary | ICD-10-CM | POA: Insufficient documentation

## 2016-09-17 DIAGNOSIS — I119 Hypertensive heart disease without heart failure: Secondary | ICD-10-CM | POA: Insufficient documentation

## 2016-09-17 DIAGNOSIS — C569 Malignant neoplasm of unspecified ovary: Secondary | ICD-10-CM | POA: Insufficient documentation

## 2016-09-17 DIAGNOSIS — I480 Paroxysmal atrial fibrillation: Secondary | ICD-10-CM | POA: Diagnosis not present

## 2016-09-17 DIAGNOSIS — C50919 Malignant neoplasm of unspecified site of unspecified female breast: Secondary | ICD-10-CM | POA: Insufficient documentation

## 2016-09-17 DIAGNOSIS — I4891 Unspecified atrial fibrillation: Secondary | ICD-10-CM | POA: Diagnosis present

## 2016-09-17 DIAGNOSIS — I351 Nonrheumatic aortic (valve) insufficiency: Secondary | ICD-10-CM | POA: Diagnosis not present

## 2016-09-17 DIAGNOSIS — R002 Palpitations: Secondary | ICD-10-CM | POA: Insufficient documentation

## 2016-09-17 NOTE — Telephone Encounter (Signed)
Mr. Stockwell is calling because she is on Eliquis and went to have it refilled but it is really expensive and is wanting to know do she have to be on this and how does she get off of it . Please call

## 2016-09-17 NOTE — Telephone Encounter (Signed)
Spoke to patient regarding Eliquis. She asks if this medication is needed. Notes her recent A Fib diagnosis, and that she can tell when she's in these, and hasn't had a return of episodes since being put on new meds (metoprolol). Aware that the Eliquis is for clot prevention, she is very concerned regarding the price, however. We did discuss that the medication copay for 1st month is ~$400, after which time it will go down somewhat. By March she should be paying about $90 a month for the prescription. She's asking if continuation of med is necessary and if so, are there more affordable options. Discussed warfarin for anticoagulation therapy. Notes she's amenable to this but wants to know if necessary 1st.  Aware I will route for discussion of options.  She shares that she has roughly 1.5 weeks left of her Eliquis.

## 2016-09-20 ENCOUNTER — Telehealth: Payer: Self-pay | Admitting: Cardiovascular Disease

## 2016-09-20 NOTE — Telephone Encounter (Signed)
Ashlee Flores is calling because she is currently taking Eliquis and does not plan to stay on medication for an extended period of time, she also states that it will cost her $1,000 over the next couple of months to refill. She would like to know if there is an alternative and how to go about switching to alternative. Please call, thanks.

## 2016-09-20 NOTE — Telephone Encounter (Signed)
Talked to patient today. Counseled about recommended lifetime of anticoagulation due to Afib.  She is well aware of warfarin option and is familiar with the mediation (her husband used warfarin).    Patient requesting input from Dr Oval Linsey about need for indefinite anticoagulation therapy. If no option to stop, she will cover the co-pay for the medication.  Eliquis 5mg  ; 7 day supply given to patient to cover need until we get answer from cardiologist.

## 2016-09-20 NOTE — Telephone Encounter (Signed)
Please see previous note.  Patient waiting for Dr Oval Linsey response about need for lifelong anticoagulation therapy.

## 2016-09-20 NOTE — Telephone Encounter (Signed)
I sent a previous note about this. She did not want to be on eliquis and needs discussion of alternatives. This will probably lean toward need to put on coumadin - will need pharmD to discuss w her. She is a new a fib diagnosis and I had explained to her that she would need to consider the fact that any medical therapy is likely going to be long term.

## 2016-09-22 NOTE — Telephone Encounter (Signed)
I agree.  This will require lifelong anticoagulation.b

## 2016-09-24 ENCOUNTER — Telehealth: Payer: Self-pay | Admitting: Cardiovascular Disease

## 2016-09-24 NOTE — Telephone Encounter (Signed)
lmtcb

## 2016-09-24 NOTE — Telephone Encounter (Signed)
F/U  Call   Ashlee Flores is calling because Dr. Oval Linsey put her on Eliquis and she has several questions about the Eliquis . Please call

## 2016-09-24 NOTE — Telephone Encounter (Signed)
F/u message ° °Pt returning RN call. Please call back to discuss  °

## 2016-09-24 NOTE — Telephone Encounter (Signed)
Duplicate

## 2016-09-25 NOTE — Telephone Encounter (Signed)
Advised patient and she will continue and discuss at follow up in February  Samples of Eliquis 5 mg #3 lot # [id number] exp 11/2018

## 2016-10-12 ENCOUNTER — Ambulatory Visit (INDEPENDENT_AMBULATORY_CARE_PROVIDER_SITE_OTHER): Payer: Medicare Other | Admitting: Cardiovascular Disease

## 2016-10-12 ENCOUNTER — Encounter: Payer: Self-pay | Admitting: Cardiovascular Disease

## 2016-10-12 VITALS — BP 106/64 | HR 56 | Ht 64.0 in | Wt 132.4 lb

## 2016-10-12 DIAGNOSIS — I10 Essential (primary) hypertension: Secondary | ICD-10-CM

## 2016-10-12 DIAGNOSIS — I480 Paroxysmal atrial fibrillation: Secondary | ICD-10-CM

## 2016-10-12 DIAGNOSIS — I519 Heart disease, unspecified: Secondary | ICD-10-CM

## 2016-10-12 DIAGNOSIS — I5189 Other ill-defined heart diseases: Secondary | ICD-10-CM

## 2016-10-12 HISTORY — DX: Other ill-defined heart diseases: I51.89

## 2016-10-12 MED ORDER — APIXABAN 5 MG PO TABS: 5.0000 mg | ORAL_TABLET | Freq: Two times a day (BID) | ORAL | 3 refills | Status: DC

## 2016-10-12 NOTE — Progress Notes (Signed)
Cardiology Office Note   Date:  10/12/2016   ID:  Anniebelle Lapier, DOB [birth year], MRN [id number]  PCP:  Lilian Coma, MD  Cardiologist:   Skeet Latch, MD   No chief complaint on file.   History of Present Illness: Saroj Stanard is a 76 y.o. female with paroxysmal atrial fibrillation, grade 2 diastolic dysfunction, and hypertension who presents for follow-up. Ms. Nowak saw Dr. Jonathon Jordan on 04/16/16. At that time she reported an episode of irregular heart rate that occurred while resting.  She wore a 30 day event monitor 07/2016 that did show atrial fibrillation with rates up to 160 bpm. Thyroid function and basic metabolic panel were normal.  She was started on Eliquis but started metoprolol as needed due to baseline bradycardia.  Since her last appointment she has been doing well.  She denies any recurrent episodes of atrial fibrillation.  She is not happy about having to take Eliquis.  It is expensive and she has many questions regarding whether she has to continue this medication.  She denies any chest pain, shortness of breath, lower extremity edema, orthopnea, PND.  She hasn't noted any palpitations, lightheadedness or dizziness.  She had an echo 09/17/16 that revealed LVEF 55-60% with grade 2 diastolic dysfunction and mild aortic regurgitation.  PASP was mildly elevated at 44 mmHg.    Past Medical History:  Diagnosis Date  . 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 Diastolic dysfunction [id number]  . Essential hypertension 06/04/2016  . Family history of breast cancer   . Family history of ovarian cancer   . Palpitations 06/04/2016  . Paroxysmal atrial fibrillation ([place]) 08/22/2016    Past Surgical History:  Procedure Laterality Date  . SALPINGOOPHORECTOMY     preventative, due to [place] of ovarian cancer     Current Outpatient Prescriptions  Medication Sig Dispense Refill  . apixaban (ELIQUIS) 5 MG TABS tablet Take 1 tablet (5 mg total) by mouth 2 (two)  times daily. 180 tablet 3  . hydrochlorothiazide (HYDRODIURIL) 25 MG tablet Take 25 mg by mouth daily.     Marland Kitchen lisinopril (PRINIVIL,ZESTRIL) 10 MG tablet Take 10 mg by mouth daily.     . metoprolol tartrate (LOPRESSOR) 25 MG tablet 1/2 TABLET AS NEEDED AT ONSET OF AFIB, OK TO TAKE ADDITIONAL 1/2 TAB IF HEART RATE STILL ELEVATED AFTER A COUPLE OF HOURS 30 tablet 5   No current facility-administered medications for this visit.     Allergies:   Penicillins    Social History:  The patient  reports that she has never smoked. She has never used smokeless tobacco. She reports that she drinks alcohol. She reports that she does not use drugs.   Family History:  The patient's family history includes Bone cancer in her paternal uncle; Breast cancer in her cousin, maternal grandmother, and paternal aunt; Breast cancer (age of onset: 58) in her maternal aunt; Breast cancer (age of onset: 79) in her mother; Heart attack in her father, maternal aunt, maternal grandfather, and maternal uncle; Hypertension in her mother; Melanoma (age of onset: 7) in her daughter; Ovarian cancer in her maternal grandmother; Ovarian cancer (age of onset: 44) in her sister; Prostate cancer (age of onset: 12) in her cousin.    ROS:  Please see the history of present illness.   Otherwise, review of systems are positive for none.   All other systems are reviewed and negative.    PHYSICAL EXAM: VS:  BP 106/64  Pulse (!) 56   Ht 5\' 4"  (1.626 m)   Wt 60 kg (132 lb 6 oz)   BMI 22.72 kg/m  , BMI Body mass index is 22.72 kg/m. GENERAL:  Well appearing HEENT:  Pupils equal round and reactive, fundi not visualized, oral mucosa unremarkable NECK:  No jugular venous distention, waveform within normal limits, carotid upstroke brisk and symmetric, no bruits, no thyromegaly LYMPHATICS:  No cervical adenopathy LUNGS:  Clear to auscultation bilaterally HEART:  Regular rhythm. Cardiac. PMI not displaced or sustained,S1 and S2 within  normal limits, no S3, no S4, no clicks, no rubs, no murmurs ABD:  Flat, positive bowel sounds normal in frequency in pitch, no bruits, no rebound, no guarding, no midline pulsatile mass, no hepatomegaly, no splenomegaly EXT:  2 plus pulses throughout, no edema, no cyanosis no clubbing SKIN:  No rashes no nodules NEURO:  Cranial nerves II through XII grossly intact, motor grossly intact throughout PSYCH:  Cognitively intact, oriented to person place and time   EKG:  EKG is ordered today. The ekg ordered 06/04/16 demonstrates sinus bradycardia rate 58 bpm.  10/12/16: Sinus bradycardia.  Rate 57 bpm.    30 Day Event Monitor 08/22/16:  Quality: Fair.  Baseline artifact. Predominant rhythm: Sinus bradycardia  Max heart rate: 160 bpm Min heart rate: 45 bpm Atrial fibrillation was noted with rates up to 160 bpm.  Echo 09/17/16: Study Conclusions  - Left ventricle: The cavity size was normal. Wall thickness was   normal. Systolic function was normal. The estimated ejection   fraction was in the range of 55% to 60%. Wall motion was normal;   there were no regional wall motion abnormalities. Features are   consistent with a pseudonormal left ventricular filling pattern,   with concomitant abnormal relaxation and increased filling   pressure (grade 2 diastolic dysfunction). - Aortic valve: There was no stenosis. There was mild   regurgitation. - Mitral valve: There was trivial regurgitation. - Left atrium: The atrium was mildly dilated. - Right ventricle: The cavity size was normal. Systolic function   was normal. - Tricuspid valve: Peak RV-RA gradient (S): 29 mm Hg. - Pulmonary arteries: PA peak pressure: 44 mm Hg (S). - Systemic veins: IVC measured 2.2 cm with < 50% respirophasic   variation, suggesting RA pressure 15 mmHg.  Impressions:  - Normal LV size with EF 55-60%. Moderate diastolic dysfunction.   Normal RV size and systolic function. Mild aortic insufficiency.   Mild  pulmonary hypertension.   Recent Labs: 08/22/2016: BUN 11; Creat 0.65; Hemoglobin 13.8; Magnesium 1.9; Platelets 209; Potassium 3.9; Sodium 143; TSH 1.82   04/16/16: WBC 4.6, hemoglobin 14.6, hematocrit 42, platelets 212  Lipid Panel No results found for: CHOL, TRIG, HDL, CHOLHDL, VLDL, LDLCALC, LDLDIRECT    Wt Readings from Last 3 Encounters:  10/12/16 60 kg (132 lb 6 oz)  08/22/16 60.4 kg (133 lb 3.2 oz)  06/04/16 59.1 kg (130 lb 3.2 oz)      ASSESSMENT AND PLAN:  # Paroxysmal atrial fibrillation:  Ms. Strutz has experienced recurrent episodes of atrial fibrillation but none lately. We discussed the importance of Eliquis and she reluctantly decided to continue the medication.  Continue metoprolol as needed due to bradycardia.     was noted to have atrial fibrillation on her event monitor. She is very symptomatic with these episodes. Given that she also has bradycardia when in sinus rhythm, we will not start a daily nodal agent.  We will start metoprolol as needed.  If she has bradycardia after using metoprolol or does not tolerate it we will consider starting an antiarrhythmic in a pill in the pocket approach. We will start Eliquis 5mg  bid for anticoagulation. She will be referred for an echocardiogram to ensure that she does not have any underlying structural heart disease.  We will also check a CBC, basic metabolic panel, magnesium, TSH, and free T4.  This patients CHA2DS2-VASc Score and unadjusted Ischemic Stroke Rate (% per year) is equal to 3.2 % stroke rate/year from a score of 3  Above score calculated as 1 point each if present [CHF, HTN, DM, Vascular=MI/PAD/Aortic Plaque, Age if 65-74, or Female] Above score calculated as 2 points each if present [Age > 75, or Stroke/TIA/TE]  # Hypertension: Blood pressure is well-controlled today. Continue hydrochlorothiazide and lisinopril.  Current medicines are reviewed at length with the patient today.  The patient does not have  concerns regarding medicines.  The following changes have been made:  no change  Labs/ tests ordered today include:   Orders Placed This Encounter  Procedures  . EKG 12-Lead   Time spent: 30 minutes-Greater than 50% of this time was spent in counseling, explanation of diagnosis, planning of further management, and coordination of care.   Disposition:   FU with Pami Wool C. Oval Linsey, MD, [facility] in 1 year   This note was written with the assistance of speech recognition software.  Please excuse any transcriptional errors.  Signed, Ariyon Mittleman C. Oval Linsey, MD, [facility]  10/12/2016 6:04 PM    [place]

## 2016-10-12 NOTE — Patient Instructions (Signed)

## 2016-10-25 ENCOUNTER — Ambulatory Visit: Payer: Medicare Other | Admitting: Cardiovascular Disease

## 2016-11-29 DIAGNOSIS — L821 Other seborrheic keratosis: Secondary | ICD-10-CM | POA: Diagnosis not present

## 2016-11-29 DIAGNOSIS — D2271 Melanocytic nevi of right lower limb, including hip: Secondary | ICD-10-CM | POA: Diagnosis not present

## 2016-11-29 DIAGNOSIS — D2272 Melanocytic nevi of left lower limb, including hip: Secondary | ICD-10-CM | POA: Diagnosis not present

## 2016-11-29 DIAGNOSIS — L814 Other melanin hyperpigmentation: Secondary | ICD-10-CM | POA: Diagnosis not present

## 2016-11-29 DIAGNOSIS — D1801 Hemangioma of skin and subcutaneous tissue: Secondary | ICD-10-CM | POA: Diagnosis not present

## 2016-11-29 DIAGNOSIS — L7211 Pilar cyst: Secondary | ICD-10-CM | POA: Diagnosis not present

## 2016-11-29 DIAGNOSIS — D225 Melanocytic nevi of trunk: Secondary | ICD-10-CM | POA: Diagnosis not present

## 2016-12-06 DIAGNOSIS — I480 Paroxysmal atrial fibrillation: Secondary | ICD-10-CM | POA: Diagnosis not present

## 2016-12-06 DIAGNOSIS — Z79899 Other long term (current) drug therapy: Secondary | ICD-10-CM | POA: Diagnosis not present

## 2016-12-06 DIAGNOSIS — I10 Essential (primary) hypertension: Secondary | ICD-10-CM | POA: Diagnosis not present

## 2016-12-06 DIAGNOSIS — I272 Pulmonary hypertension, unspecified: Secondary | ICD-10-CM | POA: Diagnosis not present

## 2016-12-06 DIAGNOSIS — I519 Heart disease, unspecified: Secondary | ICD-10-CM | POA: Diagnosis not present

## 2016-12-06 DIAGNOSIS — D692 Other nonthrombocytopenic purpura: Secondary | ICD-10-CM | POA: Diagnosis not present

## 2016-12-18 DIAGNOSIS — E876 Hypokalemia: Secondary | ICD-10-CM | POA: Diagnosis not present

## 2016-12-19 ENCOUNTER — Other Ambulatory Visit: Payer: Self-pay | Admitting: Family Medicine

## 2016-12-19 DIAGNOSIS — I480 Paroxysmal atrial fibrillation: Secondary | ICD-10-CM

## 2016-12-19 DIAGNOSIS — R42 Dizziness and giddiness: Secondary | ICD-10-CM

## 2016-12-25 ENCOUNTER — Telehealth: Payer: Self-pay | Admitting: Cardiovascular Disease

## 2016-12-25 NOTE — Telephone Encounter (Signed)
New message      Pt c/o medication issue:  1. Name of Medication:  eliquis  2. How are you currently taking this medication (dosage and times per day)? 5mg  3. Are you having a reaction (difficulty breathing--STAT)?  no  4. What is your medication issue? Can pt take tums since she is on eliquis?  She has indigestion.

## 2016-12-25 NOTE — Telephone Encounter (Signed)
Patient wondering if she can take tums with her eliquis.  Advised this should be okay but will verify with pharmacy.  Patient aware and verbalized understanding.

## 2016-12-25 NOTE — Telephone Encounter (Signed)
That is fine, but separate the 2 meds by about 2 hours

## 2016-12-25 NOTE — Telephone Encounter (Signed)
Patient made aware and verbalized understanding.

## 2016-12-27 ENCOUNTER — Ambulatory Visit (INDEPENDENT_AMBULATORY_CARE_PROVIDER_SITE_OTHER): Payer: Medicare Other | Admitting: Cardiovascular Disease

## 2016-12-27 ENCOUNTER — Encounter: Payer: Self-pay | Admitting: Cardiovascular Disease

## 2016-12-27 VITALS — BP 142/64 | HR 64 | Ht 64.0 in | Wt 129.0 lb

## 2016-12-27 DIAGNOSIS — I10 Essential (primary) hypertension: Secondary | ICD-10-CM | POA: Diagnosis not present

## 2016-12-27 DIAGNOSIS — I480 Paroxysmal atrial fibrillation: Secondary | ICD-10-CM

## 2016-12-27 MED ORDER — HYDROCHLOROTHIAZIDE 25 MG PO TABS: 25.0000 mg | ORAL_TABLET | Freq: Every day | ORAL | 3 refills | Status: DC

## 2016-12-27 MED ORDER — POTASSIUM CHLORIDE CRYS ER 20 MEQ PO TBCR: 20.0000 meq | EXTENDED_RELEASE_TABLET | Freq: Two times a day (BID) | ORAL | 5 refills | Status: DC

## 2016-12-27 MED ORDER — HYDROCHLOROTHIAZIDE 25 MG PO TABS: 25.0000 mg | ORAL_TABLET | Freq: Every day | ORAL | 5 refills | Status: DC

## 2016-12-27 MED ORDER — POTASSIUM CHLORIDE CRYS ER 20 MEQ PO TBCR: 20.0000 meq | EXTENDED_RELEASE_TABLET | Freq: Two times a day (BID) | ORAL | 3 refills | Status: DC

## 2016-12-27 NOTE — Patient Instructions (Addendum)
Medication Instructions:  START HYDROCHLOROTHIAZIDE 25 MG DAILY  START POTASSIUM 20 MEQ DAILY 2 DAILY   Labwork: NONE  Testing/Procedures: NONE  Follow-Up: Your physician recommends that you schedule a follow-up appointment in: 2 MONTH OV  If you need a refill on your cardiac medications before your next appointment, please call your pharmacy.

## 2016-12-27 NOTE — Progress Notes (Signed)
Cardiology Office Note   Date:  12/28/2016   ID:  Ashlee Flores, DOB 1940/09/21, MRN [id number]  PCP:  Lilian Coma, MD  Cardiologist:   Skeet Latch, MD   Chief Complaint  Patient presents with  . Follow-up    Pt c/o dizziness x2 weeks ago    History of Present Illness: Ashlee Flores is a 76 y.o. female with paroxysmal atrial fibrillation, grade 2 diastolic dysfunction, and hypertension who presents for follow-up. Ashlee Flores saw Dr. Jonathon Jordan on 04/16/16. At that time she reported an episode of irregular heart rate that occurred while resting.  She wore a 30 day event monitor 07/2016 that showed atrial fibrillation with rates up to 160 bpm. Thyroid function and basic metabolic panel were normal.  She was started on Eliquis but takes metoprolol as needed due to baseline bradycardia. She had an echo 09/17/16 that revealed LVEF 55-60% with grade 2 diastolic dysfunction and mild aortic regurgitation.  PASP was mildly elevated at 44 mmHg.   Ashlee Flores has been doing well.  She had an episode of atrial fibrillation last weekend. The episode lasted for 6 hours and started after GI upset. She took a dose of metoprolol which helped after an hour.  Since her last appointment she followed up with her PCP and her potassium was low so HCTZ was discontinued and she started taking lisinopril 20 mg daily.  Since then her BP has been poorly-controlled.  It has been 160s-170s.  Prior to this she had leg cramps for the preceding month.  She is very concerned about her blood pressure because she is getting ready to go on a trip to [place] for one month.   Past Medical History:  Diagnosis Date  . Breast cancer ([place])    DCIS breast cancer at 62  . Diastolic dysfunction 8/0/9983  . Essential hypertension 06/04/2016  . Family history of breast cancer   . Family history of ovarian cancer   . Palpitations 06/04/2016  . Paroxysmal atrial fibrillation ([place]) 08/22/2016    Past Surgical History:    Procedure Laterality Date  . SALPINGOOPHORECTOMY     preventative, due to [place] of ovarian cancer     Current Outpatient Prescriptions  Medication Sig Dispense Refill  . apixaban (ELIQUIS) 5 MG TABS tablet Take 1 tablet (5 mg total) by mouth 2 (two) times daily. 180 tablet 3  . lisinopril (PRINIVIL,ZESTRIL) 10 MG tablet Take 10 mg by mouth daily.     . metoprolol tartrate (LOPRESSOR) 25 MG tablet 1/2 TABLET AS NEEDED AT ONSET OF AFIB, OK TO TAKE ADDITIONAL 1/2 TAB IF HEART RATE STILL ELEVATED AFTER A COUPLE OF HOURS 30 tablet 5  . hydrochlorothiazide (HYDRODIURIL) 25 MG tablet Take 1 tablet (25 mg total) by mouth daily. 90 tablet 3  . potassium chloride SA (K-DUR,KLOR-CON) 20 MEQ tablet Take 1 tablet (20 mEq total) by mouth 2 (two) times daily. 180 tablet 3   No current facility-administered medications for this visit.     Allergies:   Penicillins    Social History:  The patient  reports that she has never smoked. She has never used smokeless tobacco. She reports that she drinks alcohol. She reports that she does not use drugs.   Family History:  The patient's family history includes Bone cancer in her paternal uncle; Breast cancer in her cousin, maternal grandmother, and paternal aunt; Breast cancer (age of onset: 78) in her maternal aunt; Breast cancer (age of onset: 30) in her mother; Heart  attack in her father, maternal aunt, maternal grandfather, and maternal uncle; Hypertension in her mother; Melanoma (age of onset: 42) in her daughter; Ovarian cancer in her maternal grandmother; Ovarian cancer (age of onset: 38) in her sister; Prostate cancer (age of onset: 67) in her cousin.    ROS:  Please see the history of present illness.   Otherwise, review of systems are positive for none.   All other systems are reviewed and negative.    PHYSICAL EXAM: VS:  BP (!) 142/64   Pulse 64   Ht 5\' 4"  (1.626 m)   Wt 58.5 kg (129 lb)   BMI 22.14 kg/m  , BMI Body mass index is 22.14  kg/m. GENERAL:  Well appearing.  No acute distress. HEENT:  Pupils equal round and reactive, fundi not visualized, oral mucosa unremarkable NECK:  No jugular venous distention, waveform within normal limits, carotid upstroke brisk and symmetric, no bruits, no thyromegaly LYMPHATICS:  No cervical adenopathy LUNGS:  Clear to auscultation bilaterally HEART:  Regular rhythm. Cardiac. PMI not displaced or sustained,S1 and S2 within normal limits, no S3, no S4, no clicks, no rubs, no murmurs ABD:  Flat, positive bowel sounds normal in frequency in pitch, no bruits, no rebound, no guarding, no midline pulsatile mass, no hepatomegaly, no splenomegaly EXT:  2 plus pulses throughout, no edema, no cyanosis no clubbing SKIN:  No rashes no nodules NEURO:  Cranial nerves II through XII grossly intact, motor grossly intact throughout PSYCH:  Cognitively intact, oriented to person place and time   EKG:  EKG is ordered today. The ekg ordered 06/04/16 demonstrates sinus bradycardia rate 58 bpm.  10/12/16: Sinus bradycardia.  Rate 57 bpm.    30 Day Event Monitor 08/22/16:  Quality: Fair.  Baseline artifact. Predominant rhythm: Sinus bradycardia  Max heart rate: 160 bpm Min heart rate: 45 bpm Atrial fibrillation was noted with rates up to 160 bpm.  Echo 09/17/16: Study Conclusions  - Left ventricle: The cavity size was normal. Wall thickness was   normal. Systolic function was normal. The estimated ejection   fraction was in the range of 55% to 60%. Wall motion was normal;   there were no regional wall motion abnormalities. Features are   consistent with a pseudonormal left ventricular filling pattern,   with concomitant abnormal relaxation and increased filling   pressure (grade 2 diastolic dysfunction). - Aortic valve: There was no stenosis. There was mild   regurgitation. - Mitral valve: There was trivial regurgitation. - Left atrium: The atrium was mildly dilated. - Right ventricle: The cavity  size was normal. Systolic function   was normal. - Tricuspid valve: Peak RV-RA gradient (S): 29 mm Hg. - Pulmonary arteries: PA peak pressure: 44 mm Hg (S). - Systemic veins: IVC measured 2.2 cm with < 50% respirophasic   variation, suggesting RA pressure 15 mmHg.  Impressions:  - Normal LV size with EF 55-60%. Moderate diastolic dysfunction.   Normal RV size and systolic function. Mild aortic insufficiency.   Mild pulmonary hypertension.   Recent Labs: 08/22/2016: BUN 11; Creat 0.65; Hemoglobin 13.8; Magnesium 1.9; Platelets 209; Potassium 3.9; Sodium 143; TSH 1.82   04/16/16: WBC 4.6, hemoglobin 14.6, hematocrit 42, platelets 212  12/06/16: Sodium 136, potassium 3.2, BUN 15, creatinine 0.79 AST 17, ALT 17 WBC 5.8, hemoglobin 13.8, hematocrit 38.8, platelets 227  Lipid Panel No results found for: CHOL, TRIG, HDL, CHOLHDL, VLDL, LDLCALC, LDLDIRECT    Wt Readings from Last 3 Encounters:  12/27/16 58.5 kg (129  lb)  10/12/16 60 kg (132 lb 6 oz)  08/22/16 60.4 kg (133 lb 3.2 oz)      ASSESSMENT AND PLAN:  # Paroxysmal atrial fibrillation:  Ashlee Flores had an episode of atrial fibrillation that responded to metoprolol.  Continue metoprolol prn and Eliquis.  This patients CHA2DS2-VASc Score and unadjusted Ischemic Stroke Rate (% per year) is equal to 3.2 % stroke rate/year from a score of 3  Above score calculated as 1 point each if present [CHF, HTN, DM, Vascular=MI/PAD/Aortic Plaque, Age if 65-74, or Female] Above score calculated as 2 points each if present [Age > 75, or Stroke/TIA/TE]  # Hypertension: Blood pressure has been elevated since she switched from HCTZ.  We will restart HCTZ and give her potassium chloride 40 mEq.  Continue hydrochlorothiazide and lisinopril.  We know that this regimen controlled her BP well. When she returns from [place] we can repeat a BMP and adjust accordingly.  Consider HCTZ-triamterene.   Current medicines are reviewed at length with the  patient today.  The patient does not have concerns regarding medicines.  The following changes have been made:  HCTZ 25mg  daily and K dur 40 mEq.  Labs/ tests ordered today include:   No orders of the defined types were placed in this encounter.  Time spent: 30 minutes-Greater than 50% of this time was spent in counseling, explanation of diagnosis, planning of further management, and coordination of care.   Disposition:   FU with Traci Plemons C. Oval Linsey, MD, [facility] in 2 months    This note was written with the assistance of speech recognition software.  Please excuse any transcriptional errors.  Signed, Avigail Pilling C. Oval Linsey, MD, [facility]  12/28/2016 7:43 PM    [place]

## 2017-01-02 ENCOUNTER — Ambulatory Visit
Admission: RE | Admit: 2017-01-02 | Discharge: 2017-01-02 | Disposition: A | Discharge status: Home / Self Care | Payer: Medicare Other | Source: Ambulatory Visit | Attending: Family Medicine | Admitting: Family Medicine

## 2017-01-02 DIAGNOSIS — R42 Dizziness and giddiness: Secondary | ICD-10-CM

## 2017-01-02 DIAGNOSIS — I480 Paroxysmal atrial fibrillation: Secondary | ICD-10-CM

## 2017-01-02 MED ORDER — GADOBENATE DIMEGLUMINE 529 MG/ML IV SOLN
11.0000 mL | Freq: Once | INTRAVENOUS | Status: AC | PRN
  Administered 2017-01-02: 11 mL via INTRAVENOUS

## 2017-01-08 DIAGNOSIS — I10 Essential (primary) hypertension: Secondary | ICD-10-CM | POA: Diagnosis not present

## 2017-01-08 DIAGNOSIS — D352 Benign neoplasm of pituitary gland: Secondary | ICD-10-CM | POA: Diagnosis not present

## 2017-02-19 ENCOUNTER — Ambulatory Visit: Payer: Medicare Other | Admitting: Cardiovascular Disease

## 2017-02-19 ENCOUNTER — Telehealth: Payer: Self-pay | Admitting: *Deleted

## 2017-02-19 DIAGNOSIS — H18413 Arcus senilis, bilateral: Secondary | ICD-10-CM | POA: Diagnosis not present

## 2017-02-19 DIAGNOSIS — H2513 Age-related nuclear cataract, bilateral: Secondary | ICD-10-CM | POA: Diagnosis not present

## 2017-02-19 DIAGNOSIS — I10 Essential (primary) hypertension: Secondary | ICD-10-CM | POA: Diagnosis not present

## 2017-02-19 DIAGNOSIS — H2512 Age-related nuclear cataract, left eye: Secondary | ICD-10-CM | POA: Diagnosis not present

## 2017-02-19 DIAGNOSIS — H02839 Dermatochalasis of unspecified eye, unspecified eyelid: Secondary | ICD-10-CM | POA: Diagnosis not present

## 2017-02-19 NOTE — Telephone Encounter (Signed)
Hold Eliquis 3 days prior to surgery.

## 2017-02-19 NOTE — Telephone Encounter (Signed)
Request for surgical clearance:  1. What type of surgery is being performed? Cataract extraction with intraocular lens implantation of the left eye follow by the right eye. To be done with topical anesthesia   2. When is this surgery scheduled? 06/24/2017  3. Are there any medications that need to be held prior to surgery and how long? Eliquis    4. Name of physician performing surgery? Darleen Crocker MD at [facility]   5. What is your office phone and fax number? Phone [phone] fax [phone]   Will forward to Dr Oval Linsey for review

## 2017-02-20 NOTE — Telephone Encounter (Signed)
Sent via EPIC 

## 2017-03-25 NOTE — Progress Notes (Signed)
Cardiology Office Note   Date:  03/26/2017   ID:  Ashlee Flores, DOB 21-Mar-1941, MRN [id number]  PCP:  Jonathon Jordan, MD  Cardiologist:   Skeet Latch, MD   Chief Complaint  Patient presents with  . Follow-up    History of Present Illness: Ashlee Flores is a 76 y.o. female with paroxysmal atrial fibrillation, grade 2 diastolic dysfunction, and hypertension who presents for follow-up. Ashlee Flores saw Dr. Jonathon Jordan on 04/16/16. At that time she reported an episode of irregular heart rate that occurred while resting.  She wore a 30 day event monitor 07/2016 that showed atrial fibrillation with rates up to 160 bpm. Thyroid function and basic metabolic panel were normal.  She was started on Eliquis but takes metoprolol as needed due to baseline bradycardia. She had an echo 09/17/16 that revealed LVEF 55-60% with grade 2 diastolic dysfunction and mild aortic regurgitation.  PASP was mildly elevated at 44 mmHg.   Since her last appointment Ashlee Flores has been doing well. She's been exercising regularly and walks up to 12 miles without difficulty. She denies lower extremity edema, orthopnea, or PND. She has not noted any palpitations lately. Her blood pressure has been well controlled. She wonders if she can stop taking Eliquis even that she's not had any recurrent palpitations. She attributes the atrial fibrillation episodes to hypokalemia.    Past Medical History:  Diagnosis Date  . 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 Diastolic dysfunction 03/13/8269  . Essential hypertension 06/04/2016  . Family history of breast cancer   . Family history of ovarian cancer   . Palpitations 06/04/2016  . Paroxysmal atrial fibrillation ([place]) 08/22/2016    Past Surgical History:  Procedure Laterality Date  . SALPINGOOPHORECTOMY     preventative, due to [place] of ovarian cancer     Current Outpatient Prescriptions  Medication Sig Dispense Refill  . apixaban (ELIQUIS) 5 MG TABS  tablet Take 1 tablet (5 mg total) by mouth 2 (two) times daily. 180 tablet 3  . hydrochlorothiazide (HYDRODIURIL) 25 MG tablet Take 1 tablet (25 mg total) by mouth daily. 90 tablet 3  . lisinopril (PRINIVIL,ZESTRIL) 10 MG tablet Take 10 mg by mouth daily.     . metoprolol tartrate (LOPRESSOR) 25 MG tablet 1/2 TABLET AS NEEDED AT ONSET OF AFIB, OK TO TAKE ADDITIONAL 1/2 TAB IF HEART RATE STILL ELEVATED AFTER A COUPLE OF HOURS 30 tablet 5  . potassium chloride SA (K-DUR,KLOR-CON) 20 MEQ tablet Take 1 tablet (20 mEq total) by mouth 2 (two) times daily. 180 tablet 3   No current facility-administered medications for this visit.     Allergies:   Penicillins    Social History:  The patient  reports that she has never smoked. She has never used smokeless tobacco. She reports that she drinks alcohol. She reports that she does not use drugs.   Family History:  The patient's family history includes Bone cancer in her paternal uncle; Breast cancer in her cousin, maternal grandmother, and paternal aunt; Breast cancer (age of onset: 24) in her maternal aunt; Breast cancer (age of onset: 44) in her mother; Heart attack in her father, maternal aunt, maternal grandfather, and maternal uncle; Hypertension in her mother; Melanoma (age of onset: 38) in her daughter; Ovarian cancer in her maternal grandmother; Ovarian cancer (age of onset: 69) in her sister; Prostate cancer (age of onset: 21) in her cousin.    ROS:  Please see the history  of present illness.   Otherwise, review of systems are positive for none.   All other systems are reviewed and negative.    PHYSICAL EXAM: VS:  BP 126/62   Pulse (!) 54   Ht 5\' 4"  (1.626 m)   Wt 60.5 kg (133 lb 6.4 oz)   BMI 22.90 kg/m  , BMI Body mass index is 22.9 kg/m. GENERAL:  Well appearing.  No acute distress. HEENT:  Pupils equal round and reactive, fundi not visualized, oral mucosa unremarkable NECK:  No jugular venous distention, waveform within normal limits,  carotid upstroke brisk and symmetric, no bruits, no thyromegaly LYMPHATICS:  No cervical adenopathy LUNGS:  Clear to auscultation bilaterally HEART:  Regular rhythm. Cardiac. PMI not displaced or sustained,S1 and S2 within normal limits, no S3, no S4, no clicks, no rubs, no murmurs ABD:  Flat, positive bowel sounds normal in frequency in pitch, no bruits, no rebound, no guarding, no midline pulsatile mass, no hepatomegaly, no splenomegaly EXT:  2 plus pulses throughout, no edema, no cyanosis no clubbing SKIN:  No rashes no nodules NEURO:  Cranial nerves II through XII grossly intact, motor grossly intact throughout PSYCH:  Cognitively intact, oriented to person place and time   EKG:  EKG is ordered today. The ekg ordered 06/04/16 demonstrates sinus bradycardia rate 58 bpm.  10/12/16: Sinus bradycardia.  Rate 57 bpm.  03/26/17: Sinus bradycardia. Rate 54 bpm.   30 Day Event Monitor 08/22/16:  Quality: Fair.  Baseline artifact. Predominant rhythm: Sinus bradycardia  Max heart rate: 160 bpm Min heart rate: 45 bpm Atrial fibrillation was noted with rates up to 160 bpm.  Echo 09/17/16: Study Conclusions  - Left ventricle: The cavity size was normal. Wall thickness was   normal. Systolic function was normal. The estimated ejection   fraction was in the range of 55% to 60%. Wall motion was normal;   there were no regional wall motion abnormalities. Features are   consistent with a pseudonormal left ventricular filling pattern,   with concomitant abnormal relaxation and increased filling   pressure (grade 2 diastolic dysfunction). - Aortic valve: There was no stenosis. There was mild   regurgitation. - Mitral valve: There was trivial regurgitation. - Left atrium: The atrium was mildly dilated. - Right ventricle: The cavity size was normal. Systolic function   was normal. - Tricuspid valve: Peak RV-RA gradient (S): 29 mm Hg. - Pulmonary arteries: PA peak pressure: 44 mm Hg (S). -  Systemic veins: IVC measured 2.2 cm with < 50% respirophasic   variation, suggesting RA pressure 15 mmHg.  Impressions:  - Normal LV size with EF 55-60%. Moderate diastolic dysfunction.   Normal RV size and systolic function. Mild aortic insufficiency.   Mild pulmonary hypertension.   Recent Labs: 08/22/2016: BUN 11; Creat 0.65; Hemoglobin 13.8; Magnesium 1.9; Platelets 209; Potassium 3.9; Sodium 143; TSH 1.82   04/16/16: WBC 4.6, hemoglobin 14.6, hematocrit 42, platelets 212  12/06/16: Sodium 136, potassium 3.2, BUN 15, creatinine 0.79 AST 17, ALT 17 WBC 5.8, hemoglobin 13.8, hematocrit 38.8, platelets 227  Lipid Panel No results found for: CHOL, TRIG, HDL, CHOLHDL, VLDL, LDLCALC, LDLDIRECT    Wt Readings from Last 3 Encounters:  03/26/17 60.5 kg (133 lb 6.4 oz)  12/27/16 58.5 kg (129 lb)  10/12/16 60 kg (132 lb 6 oz)      ASSESSMENT AND PLAN:  # Paroxysmal atrial fibrillation:  No recent episodes.  Continue metoprolol prn and Eliquis.  Ashlee Flores wants to come off liquids.  We discussed the fact that this increasesher risk of stroke. She thinks that her atrial fibrillation occurred due to hypokalemia. She does have mild left atrial enlargement on echo. She is willing to wait at least one year since her last episode, which would be March 2019.  Check CMP and magnesium.   This patients CHA2DS2-VASc Score and unadjusted Ischemic Stroke Rate (% per year) is equal to 3.2 % stroke rate/year from a score of 3  Above score calculated as 1 point each if present [CHF, HTN, DM, Vascular=MI/PAD/Aortic Plaque, Age if 65-74, or Female] Above score calculated as 2 points each if present [Age > 75, or Stroke/TIA/TE]  # Hypertension: Blood pressureIs much better after restarting hydrochlorothiazide. Continue Hydrocort thiazide, lisinopril, and metoprolol.   # CV Disease Prevention:  Check lipid panel. Current medicines are reviewed at length with the patient today.  The patient does not  have concerns regarding medicines.  The following changes have been made:  none  Labs/ tests ordered today include:   Orders Placed This Encounter  Procedures  . Lipid panel  . Comprehensive metabolic panel  . Magnesium  . EKG 12-Lead     Disposition:   FU with Ashlee Whitehouse C. Oval Linsey, MD, [facility] 11/2017.   This note was written with the assistance of speech recognition software.  Please excuse any transcriptional errors.  Signed, Noell Shular C. Oval Linsey, MD, [facility]  03/26/2017 9:18 AM    [place]

## 2017-03-26 ENCOUNTER — Ambulatory Visit (INDEPENDENT_AMBULATORY_CARE_PROVIDER_SITE_OTHER): Payer: Medicare Other | Admitting: Cardiovascular Disease

## 2017-03-26 ENCOUNTER — Encounter: Payer: Self-pay | Admitting: Cardiovascular Disease

## 2017-03-26 ENCOUNTER — Encounter (INDEPENDENT_AMBULATORY_CARE_PROVIDER_SITE_OTHER): Payer: Self-pay

## 2017-03-26 VITALS — BP 126/62 | HR 54 | Ht 64.0 in | Wt 133.4 lb

## 2017-03-26 DIAGNOSIS — Z79899 Other long term (current) drug therapy: Secondary | ICD-10-CM

## 2017-03-26 DIAGNOSIS — I480 Paroxysmal atrial fibrillation: Secondary | ICD-10-CM | POA: Diagnosis not present

## 2017-03-26 DIAGNOSIS — I10 Essential (primary) hypertension: Secondary | ICD-10-CM

## 2017-03-26 NOTE — Patient Instructions (Addendum)
Medication Instructions:  Your physician recommends that you continue on your current medications as directed. Please refer to the Current Medication list given to you today.  Labwork: Fasting LP/CMET/MAGNESIUM SOON   Testing/Procedures: NONE  Follow-Up: Your physician wants you to follow-up in: MARCH 2019 You will receive a reminder letter in the mail two months in advance. If you don't receive a letter, please call our office to schedule the follow-up appointment.  If you need a refill on your cardiac medications before your next appointment, please call your pharmacy.

## 2017-03-27 DIAGNOSIS — I10 Essential (primary) hypertension: Secondary | ICD-10-CM | POA: Diagnosis not present

## 2017-03-27 DIAGNOSIS — Z79899 Other long term (current) drug therapy: Secondary | ICD-10-CM | POA: Diagnosis not present

## 2017-03-27 DIAGNOSIS — I480 Paroxysmal atrial fibrillation: Secondary | ICD-10-CM | POA: Diagnosis not present

## 2017-03-27 LAB — COMPREHENSIVE METABOLIC PANEL
ALT: 21 IU/L (ref 0–32)
AST: 23 IU/L (ref 0–40)
Albumin/Globulin Ratio: 2.3 — ABNORMAL HIGH (ref 1.2–2.2)
Albumin: 4.6 g/dL (ref 3.5–4.8)
Alkaline Phosphatase: 66 IU/L (ref 39–117)
BUN/Creatinine Ratio: 19 (ref 12–28)
BUN: 12 mg/dL (ref 8–27)
Bilirubin Total: 0.8 mg/dL (ref 0.0–1.2)
CO2: 25 mmol/L (ref 20–29)
Calcium: 9.8 mg/dL (ref 8.7–10.3)
Chloride: 98 mmol/L (ref 96–106)
Creatinine, Ser: 0.64 mg/dL (ref 0.57–1.00)
GFR calc Af Amer: 101 mL/min/{1.73_m2} (ref >59)
GFR calc non Af Amer: 88 mL/min/{1.73_m2} (ref >59)
Globulin, Total: 2 g/dL (ref 1.5–4.5)
Glucose: 108 mg/dL — ABNORMAL HIGH (ref 65–99)
Potassium: 5.1 mmol/L (ref 3.5–5.2)
Sodium: 138 mmol/L (ref 134–144)
Total Protein: 6.6 g/dL (ref 6.0–8.5)

## 2017-03-27 LAB — LIPID PANEL
Chol/HDL Ratio: 2.2 ratio (ref 0.0–4.4)
Cholesterol, Total: 185 mg/dL (ref 100–199)
HDL: 84 mg/dL (ref >39)
LDL Calculated: 89 mg/dL (ref 0–99)
Triglycerides: 62 mg/dL (ref 0–149)
VLDL Cholesterol Cal: 12 mg/dL (ref 5–40)

## 2017-03-27 LAB — MAGNESIUM: Magnesium: 1.9 mg/dL (ref 1.6–2.3)

## 2017-04-02 ENCOUNTER — Encounter: Payer: Self-pay | Admitting: Cardiovascular Disease

## 2017-04-09 ENCOUNTER — Encounter: Payer: Self-pay | Admitting: Cardiovascular Disease

## 2017-05-09 DIAGNOSIS — M7661 Achilles tendinitis, right leg: Secondary | ICD-10-CM | POA: Diagnosis not present

## 2017-05-09 DIAGNOSIS — M79671 Pain in right foot: Secondary | ICD-10-CM | POA: Diagnosis not present

## 2017-06-11 DIAGNOSIS — Z23 Encounter for immunization: Secondary | ICD-10-CM | POA: Diagnosis not present

## 2017-06-13 DIAGNOSIS — R87810 Cervical high risk human papillomavirus (HPV) DNA test positive: Secondary | ICD-10-CM | POA: Diagnosis not present

## 2017-06-13 DIAGNOSIS — Z01411 Encounter for gynecological examination (general) (routine) with abnormal findings: Secondary | ICD-10-CM | POA: Diagnosis not present

## 2017-06-13 DIAGNOSIS — Z13820 Encounter for screening for osteoporosis: Secondary | ICD-10-CM | POA: Diagnosis not present

## 2017-06-13 DIAGNOSIS — B977 Papillomavirus as the cause of diseases classified elsewhere: Secondary | ICD-10-CM | POA: Diagnosis not present

## 2017-06-13 DIAGNOSIS — Z9189 Other specified personal risk factors, not elsewhere classified: Secondary | ICD-10-CM | POA: Diagnosis not present

## 2017-06-24 DIAGNOSIS — H2512 Age-related nuclear cataract, left eye: Secondary | ICD-10-CM | POA: Diagnosis not present

## 2017-06-25 DIAGNOSIS — H2511 Age-related nuclear cataract, right eye: Secondary | ICD-10-CM | POA: Diagnosis not present

## 2017-07-02 ENCOUNTER — Other Ambulatory Visit: Payer: Self-pay | Admitting: Obstetrics & Gynecology

## 2017-07-02 DIAGNOSIS — Z1231 Encounter for screening mammogram for malignant neoplasm of breast: Secondary | ICD-10-CM

## 2017-07-10 DIAGNOSIS — Z780 Asymptomatic menopausal state: Secondary | ICD-10-CM | POA: Diagnosis not present

## 2017-07-10 DIAGNOSIS — M8588 Other specified disorders of bone density and structure, other site: Secondary | ICD-10-CM | POA: Diagnosis not present

## 2017-08-05 ENCOUNTER — Ambulatory Visit: Payer: Medicare Other

## 2017-08-09 ENCOUNTER — Ambulatory Visit
Admission: RE | Admit: 2017-08-09 | Discharge: 2017-08-09 | Disposition: A | Discharge status: Home / Self Care | Payer: Medicare Other | Source: Ambulatory Visit | Attending: Obstetrics & Gynecology | Admitting: Obstetrics & Gynecology

## 2017-08-09 DIAGNOSIS — Z1231 Encounter for screening mammogram for malignant neoplasm of breast: Secondary | ICD-10-CM | POA: Diagnosis not present

## 2017-08-13 ENCOUNTER — Other Ambulatory Visit: Payer: Self-pay | Admitting: Obstetrics & Gynecology

## 2017-08-13 DIAGNOSIS — R928 Other abnormal and inconclusive findings on diagnostic imaging of breast: Secondary | ICD-10-CM

## 2017-08-14 ENCOUNTER — Ambulatory Visit
Admission: RE | Admit: 2017-08-14 | Discharge: 2017-08-14 | Disposition: A | Discharge status: Home / Self Care | Payer: Medicare Other | Source: Ambulatory Visit | Attending: Obstetrics & Gynecology | Admitting: Obstetrics & Gynecology

## 2017-08-14 ENCOUNTER — Ambulatory Visit: Payer: Medicare Other

## 2017-08-14 DIAGNOSIS — R922 Inconclusive mammogram: Secondary | ICD-10-CM | POA: Diagnosis not present

## 2017-08-14 DIAGNOSIS — R928 Other abnormal and inconclusive findings on diagnostic imaging of breast: Secondary | ICD-10-CM

## 2017-08-15 DIAGNOSIS — Z79899 Other long term (current) drug therapy: Secondary | ICD-10-CM | POA: Diagnosis not present

## 2017-08-15 DIAGNOSIS — Z0000 Encounter for general adult medical examination without abnormal findings: Secondary | ICD-10-CM | POA: Diagnosis not present

## 2017-10-11 ENCOUNTER — Telehealth: Payer: Self-pay | Admitting: *Deleted

## 2017-10-11 MED ORDER — APIXABAN 5 MG PO TABS: 5.0000 mg | ORAL_TABLET | Freq: Two times a day (BID) | ORAL | 1 refills | Status: DC

## 2017-10-11 NOTE — Telephone Encounter (Signed)
Patient left a msg on the refill vm requesting eliquis samples as she only has enough to last another week. She also requested a new rx be sent in but did not indicate which pharmacy. She requested a call back at [phone]. Thanks, MI

## 2017-10-25 DIAGNOSIS — H40053 Ocular hypertension, bilateral: Secondary | ICD-10-CM | POA: Diagnosis not present

## 2017-10-29 DIAGNOSIS — D485 Neoplasm of uncertain behavior of skin: Secondary | ICD-10-CM | POA: Diagnosis not present

## 2017-10-29 DIAGNOSIS — C44719 Basal cell carcinoma of skin of left lower limb, including hip: Secondary | ICD-10-CM | POA: Diagnosis not present

## 2017-10-29 DIAGNOSIS — Z23 Encounter for immunization: Secondary | ICD-10-CM | POA: Diagnosis not present

## 2017-12-02 DIAGNOSIS — D1801 Hemangioma of skin and subcutaneous tissue: Secondary | ICD-10-CM | POA: Diagnosis not present

## 2017-12-02 DIAGNOSIS — D225 Melanocytic nevi of trunk: Secondary | ICD-10-CM | POA: Diagnosis not present

## 2017-12-02 DIAGNOSIS — M6740 Ganglion, unspecified site: Secondary | ICD-10-CM | POA: Diagnosis not present

## 2017-12-02 DIAGNOSIS — L821 Other seborrheic keratosis: Secondary | ICD-10-CM | POA: Diagnosis not present

## 2017-12-02 DIAGNOSIS — D1722 Benign lipomatous neoplasm of skin and subcutaneous tissue of left arm: Secondary | ICD-10-CM | POA: Diagnosis not present

## 2017-12-02 DIAGNOSIS — D2271 Melanocytic nevi of right lower limb, including hip: Secondary | ICD-10-CM | POA: Diagnosis not present

## 2017-12-02 DIAGNOSIS — Z23 Encounter for immunization: Secondary | ICD-10-CM | POA: Diagnosis not present

## 2017-12-02 DIAGNOSIS — L814 Other melanin hyperpigmentation: Secondary | ICD-10-CM | POA: Diagnosis not present

## 2017-12-02 DIAGNOSIS — D2272 Melanocytic nevi of left lower limb, including hip: Secondary | ICD-10-CM | POA: Diagnosis not present

## 2017-12-06 DIAGNOSIS — R2231 Localized swelling, mass and lump, right upper limb: Secondary | ICD-10-CM | POA: Diagnosis not present

## 2017-12-25 DIAGNOSIS — C44719 Basal cell carcinoma of skin of left lower limb, including hip: Secondary | ICD-10-CM | POA: Diagnosis not present

## 2017-12-25 DIAGNOSIS — Z85828 Personal history of other malignant neoplasm of skin: Secondary | ICD-10-CM | POA: Diagnosis not present

## 2017-12-31 ENCOUNTER — Telehealth: Payer: Self-pay | Admitting: *Deleted

## 2017-12-31 MED ORDER — METOPROLOL TARTRATE 25 MG PO TABS: ORAL_TABLET | ORAL | 2 refills | Status: DC

## 2017-12-31 NOTE — Telephone Encounter (Signed)
Returned call to patient no answer.LMTC. 

## 2017-12-31 NOTE — Telephone Encounter (Signed)
Patient left a msg on the refill vm stating that the metoprolol was written in 08/2016 for PRN use. Recently she has had more episodes of Afib. She would also like to know when she needs to f/u with Dr Oval Linsey. She didn't provide a call back number. Thanks, MI

## 2017-12-31 NOTE — Telephone Encounter (Signed)
Received call back from patient.She stated she does need metoprolol refill.Refill sent to pharmacy.Appointment scheduled with Dr.[place] 03/06/18 at 9:40 am.

## 2018-01-01 DIAGNOSIS — C44719 Basal cell carcinoma of skin of left lower limb, including hip: Secondary | ICD-10-CM | POA: Diagnosis not present

## 2018-02-25 ENCOUNTER — Other Ambulatory Visit: Payer: Self-pay | Admitting: Cardiovascular Disease

## 2018-03-06 ENCOUNTER — Ambulatory Visit: Payer: Medicare Other | Admitting: Cardiovascular Disease

## 2018-04-03 ENCOUNTER — Other Ambulatory Visit: Payer: Self-pay

## 2018-04-03 ENCOUNTER — Telehealth: Payer: Self-pay | Admitting: Cardiovascular Disease

## 2018-04-03 MED ORDER — APIXABAN 5 MG PO TABS: 5.0000 mg | ORAL_TABLET | Freq: Two times a day (BID) | ORAL | 1 refills | Status: DC

## 2018-04-03 NOTE — Telephone Encounter (Signed)
°*  STAT* If patient is at the pharmacy, call can be transferred to refill team. ° ° °1. Which medications need to be refilled? (please list name of each medication and dose if known) Eliquis ° °2. Which pharmacy/location (including street and city if local pharmacy) is medication to be sent to?[facility] °3. Do they need a 30 day or 90 day supply? 180 and refills ° ° ° °

## 2018-05-14 ENCOUNTER — Other Ambulatory Visit: Payer: Self-pay

## 2018-05-14 ENCOUNTER — Encounter: Payer: Self-pay | Admitting: Cardiovascular Disease

## 2018-05-14 ENCOUNTER — Ambulatory Visit (INDEPENDENT_AMBULATORY_CARE_PROVIDER_SITE_OTHER): Payer: Medicare Other | Admitting: Cardiovascular Disease

## 2018-05-14 VITALS — BP 124/62 | HR 57 | Ht 64.0 in | Wt 128.2 lb

## 2018-05-14 DIAGNOSIS — R739 Hyperglycemia, unspecified: Secondary | ICD-10-CM | POA: Diagnosis not present

## 2018-05-14 DIAGNOSIS — I10 Essential (primary) hypertension: Secondary | ICD-10-CM

## 2018-05-14 DIAGNOSIS — I480 Paroxysmal atrial fibrillation: Secondary | ICD-10-CM | POA: Diagnosis not present

## 2018-05-14 MED ORDER — POTASSIUM CHLORIDE CRYS ER 20 MEQ PO TBCR: 20.0000 meq | EXTENDED_RELEASE_TABLET | Freq: Two times a day (BID) | ORAL | 3 refills | Status: DC

## 2018-05-14 NOTE — Progress Notes (Signed)
Cardiology Office Note   Date:  05/14/2018   ID:  Ashlee Flores, DOB 08-31-1941, MRN [id number]  PCP:  Jonathon Jordan, MD  Cardiologist:   Skeet Latch, MD   No chief complaint on file.   History of Present Illness: Ashlee Flores is a 77 y.o. female with paroxysmal atrial fibrillation, grade 2 diastolic dysfunction, and hypertension who presents for follow-up. Ashlee Flores saw Dr. Jonathon Jordan on 04/16/16. At that time she reported an episode of irregular heart rate that occurred while resting.  She wore a 30 day event monitor 07/2016 that showed atrial fibrillation with rates up to 160 bpm. Thyroid function and basic metabolic panel were normal.  She was started on Eliquis but takes metoprolol as needed due to baseline bradycardia. She had an echo 09/17/16 that revealed LVEF 55-60% with grade 2 diastolic dysfunction and mild aortic regurgitation.  PASP was mildly elevated at 44 mmHg.   Ashlee Flores has been doing well.  She continues to walk daily and has no exertional symptoms.  She has some paroxysmal episodes of atrial fibrillation.  The most recent episode occurred last month and lasted for several hours.  She thinks sometimes it is triggered by her diet or alcohol.  When she has A. fib she starts to feel like her stomach is upset first.  She has not expands any chest pain or shortness of breath.  She has no lower extremity edema, orthopnea, or PND.  She has been struggling with pain in her right leg.  It initially started with pain in her Achilles and now she has pain in her knee and hip.  She thinks this is because she is been changing the way she walks.  She has swelling and discomfort over her Achilles tendon.  She also reports cramping in her legs, neck, and jaw when she lays in bed at night.  Ashlee Flores has been very stressed lately because her 36 year old grandson was diagnosed with leukemia.  He has been undergoing treatment for the last several months.  Despite this her blood  pressure has been well-controlled.  At home it is been mostly around 120/70.   Past Medical History:  Diagnosis Date  . Breast cancer ([place])    DCIS breast cancer at 64  . Diastolic dysfunction 5/0/5397  . Essential hypertension 06/04/2016  . Family history of breast cancer   . Family history of ovarian cancer   . Palpitations 06/04/2016  . Paroxysmal atrial fibrillation ([place]) 08/22/2016    Past Surgical History:  Procedure Laterality Date  . BREAST EXCISIONAL BIOPSY Right   . SALPINGOOPHORECTOMY     preventative, due to [place] of ovarian cancer     Current Outpatient Medications  Medication Sig Dispense Refill  . apixaban (ELIQUIS) 5 MG TABS tablet Take 1 tablet (5 mg total) by mouth 2 (two) times daily. 180 tablet 1  . hydrochlorothiazide (HYDRODIURIL) 25 MG tablet TAKE 1 TABLET (25 MG TOTAL) BY MOUTH DAILY. 90 tablet 0  . lisinopril (PRINIVIL,ZESTRIL) 10 MG tablet Take 10 mg by mouth daily.     . metoprolol tartrate (LOPRESSOR) 25 MG tablet 1/2 TABLET AS NEEDED AT ONSET OF AFIB, OK TO TAKE ADDITIONAL 1/2 TAB IF HEART RATE STILL ELEVATED AFTER A COUPLE OF HOURS 30 tablet 2  . potassium chloride SA (K-DUR,KLOR-CON) 20 MEQ tablet Take 1 tablet (20 mEq total) by mouth 2 (two) times daily. 180 tablet 3   No current facility-administered medications for this visit.     Allergies:  Penicillins    Social History:  The patient  reports that she has never smoked. She has never used smokeless tobacco. She reports that she drinks alcohol. She reports that she does not use drugs.   Family History:  The patient's family history includes Bone cancer in her paternal uncle; Breast cancer in her cousin, maternal grandmother, and paternal aunt; Breast cancer (age of onset: 65) in her maternal aunt; Breast cancer (age of onset: 57) in her mother; Heart attack in her father, maternal aunt, maternal grandfather, and maternal uncle; Hypertension in her mother; Melanoma (age of onset: 59) in her  daughter; Ovarian cancer in her maternal grandmother; Ovarian cancer (age of onset: 52) in her sister; Prostate cancer (age of onset: 23) in her cousin.    ROS:  Please see the history of present illness.   Otherwise, review of systems are positive for none.   All other systems are reviewed and negative.    PHYSICAL EXAM: VS:  BP 124/62   Pulse (!) 57   Ht 5\' 4"  (1.626 m)   Wt 128 lb 3.2 oz (58.2 kg)   BMI 22.01 kg/m  , BMI Body mass index is 22.01 kg/m. GENERAL:  Well appearing HEENT: Pupils equal round and reactive, fundi not visualized, oral mucosa unremarkable NECK:  No jugular venous distention, waveform within normal limits, carotid upstroke brisk and symmetric, no bruits LUNGS:  Clear to auscultation bilaterally HEART:  RRR.  PMI not displaced or sustained,S1 and S2 within normal limits, no S3, no S4, no clicks, no rubs, no murmurs ABD:  Flat, positive bowel sounds normal in frequency in pitch, no bruits, no rebound, no guarding, no midline pulsatile mass, no hepatomegaly, no splenomegaly EXT:  2 plus pulses throughout, no edema, no cyanosis no clubbing SKIN:  No rashes no nodules NEURO:  Cranial nerves II through XII grossly intact, motor grossly intact throughout PSYCH:  Cognitively intact, oriented to person place and time   EKG:  EKG is ordered today. The ekg ordered 06/04/16 demonstrates sinus bradycardia rate 58 bpm.  10/12/16: Sinus bradycardia.  Rate 57 bpm.  03/26/17: Sinus bradycardia. Rate 54 bpm. 05/14/2018: Sinus bradycardia.  Rate 57 bpm.   30 Day Event Monitor 08/22/16:  Quality: Fair.  Baseline artifact. Predominant rhythm: Sinus bradycardia  Max heart rate: 160 bpm Min heart rate: 45 bpm Atrial fibrillation was noted with rates up to 160 bpm.  Echo 09/17/16: Study Conclusions  - Left ventricle: The cavity size was normal. Wall thickness was   normal. Systolic function was normal. The estimated ejection   fraction was in the range of 55% to 60%. Wall  motion was normal;   there were no regional wall motion abnormalities. Features are   consistent with a pseudonormal left ventricular filling pattern,   with concomitant abnormal relaxation and increased filling   pressure (grade 2 diastolic dysfunction). - Aortic valve: There was no stenosis. There was mild   regurgitation. - Mitral valve: There was trivial regurgitation. - Left atrium: The atrium was mildly dilated. - Right ventricle: The cavity size was normal. Systolic function   was normal. - Tricuspid valve: Peak RV-RA gradient (S): 29 mm Hg. - Pulmonary arteries: PA peak pressure: 44 mm Hg (S). - Systemic veins: IVC measured 2.2 cm with < 50% respirophasic   variation, suggesting RA pressure 15 mmHg.  Impressions:  - Normal LV size with EF 55-60%. Moderate diastolic dysfunction.   Normal RV size and systolic function. Mild aortic insufficiency.   Mild  pulmonary hypertension.   Recent Labs: No results found for requested labs within last 8760 hours.   04/16/16: WBC 4.6, hemoglobin 14.6, hematocrit 42, platelets 212  12/06/16: Sodium 136, potassium 3.2, BUN 15, creatinine 0.79 AST 17, ALT 17 WBC 5.8, hemoglobin 13.8, hematocrit 38.8, platelets 227  Lipid Panel    Component Value Date/Time   CHOL 185 03/27/2017 0000   TRIG 62 03/27/2017 0000   HDL 84 03/27/2017 0000   CHOLHDL 2.2 03/27/2017 0000   LDLCALC 89 03/27/2017 0000      Wt Readings from Last 3 Encounters:  05/14/18 128 lb 3.2 oz (58.2 kg)  03/26/17 133 lb 6.4 oz (60.5 kg)  12/27/16 129 lb (58.5 kg)      ASSESSMENT AND PLAN:  # Paroxysmal atrial fibrillation:  Short episodes of atrial fibrillation. Continue prn metoprolol and Eliquis.  Check CBC, CMP, magnesium, and TSH.  This patients CHA2DS2-VASc Score and unadjusted Ischemic Stroke Rate (% per year) is equal to 3.2 % stroke rate/year from a score of 3  Above score calculated as 1 point each if present [CHF, HTN, DM, Vascular=MI/PAD/Aortic  Plaque, Age if 65-74, or Female] Above score calculated as 2 points each if present [Age > 75, or Stroke/TIA/TE]  # Hypertension: Blood pressure was initially elevated but better on repeat.  Continue hydrochlorothiazide, lisinopril, and PRN metoprolol.  # CV Disease Prevention:  Check lipid panel and hemoglobin A1c.  She has not seen her PCP in quite some time.  Current medicines are reviewed at length with the patient today.  The patient does not have concerns regarding medicines.  The following changes have been made:  none  Labs/ tests ordered today include:   Orders Placed This Encounter  Procedures  . Lipid panel  . Comprehensive metabolic panel  . CBC with Differential/Platelet  . TSH  . Magnesium  . HgB A1c  . EKG 12-Lead     Disposition:   FU with Tarren Sabree C. Oval Linsey, MD, [facility] in 1 year.     Signed, Damyia Strider C. Oval Linsey, MD, [facility]  05/14/2018 10:16 AM    [place]

## 2018-05-14 NOTE — Patient Instructions (Addendum)
Medication Instructions:  Your physician recommends that you continue on your current medications as directed. Please refer to the Current Medication list given to you today.  Labwork: LP/CMET/MAGNESIUM/TSH/CBC/ A1C TOMORROW   Testing/Procedures: none  Follow-Up: Your physician wants you to follow-up in: 1 year  You will receive a reminder letter in the mail two months in advance. If you don't receive a letter, please call our office to schedule the follow-up appointment.  If you need a refill on your cardiac medications before your next appointment, please call your pharmacy.

## 2018-05-15 DIAGNOSIS — I480 Paroxysmal atrial fibrillation: Secondary | ICD-10-CM | POA: Diagnosis not present

## 2018-05-15 DIAGNOSIS — I10 Essential (primary) hypertension: Secondary | ICD-10-CM | POA: Diagnosis not present

## 2018-05-15 DIAGNOSIS — R739 Hyperglycemia, unspecified: Secondary | ICD-10-CM | POA: Diagnosis not present

## 2018-05-16 LAB — LIPID PANEL
Chol/HDL Ratio: 2.4 ratio (ref 0.0–4.4)
Cholesterol, Total: 181 mg/dL (ref 100–199)
HDL: 77 mg/dL (ref >39)
LDL Calculated: 93 mg/dL (ref 0–99)
Triglycerides: 55 mg/dL (ref 0–149)
VLDL Cholesterol Cal: 11 mg/dL (ref 5–40)

## 2018-05-16 LAB — CBC WITH DIFFERENTIAL/PLATELET
Basophils Absolute: 0 10*3/uL (ref 0.0–0.2)
Basos: 0 %
EOS (ABSOLUTE): 0.1 10*3/uL (ref 0.0–0.4)
Eos: 2 %
Hematocrit: 37.4 % (ref 34.0–46.6)
Hemoglobin: 13.7 g/dL (ref 11.1–15.9)
Immature Grans (Abs): 0 10*3/uL (ref 0.0–0.1)
Immature Granulocytes: 0 %
Lymphocytes Absolute: 2 10*3/uL (ref 0.7–3.1)
Lymphs: 40 %
MCH: 31.9 pg (ref 26.6–33.0)
MCHC: 36.6 g/dL — ABNORMAL HIGH (ref 31.5–35.7)
MCV: 87 fL (ref 79–97)
Monocytes Absolute: 0.4 10*3/uL (ref 0.1–0.9)
Monocytes: 8 %
Neutrophils Absolute: 2.5 10*3/uL (ref 1.4–7.0)
Neutrophils: 50 %
Platelets: 213 10*3/uL (ref 150–450)
RBC: 4.3 x10E6/uL (ref 3.77–5.28)
RDW: 13.7 % (ref 12.3–15.4)
WBC: 5 10*3/uL (ref 3.4–10.8)

## 2018-05-16 LAB — COMPREHENSIVE METABOLIC PANEL
ALT: 17 IU/L (ref 0–32)
AST: 12 IU/L (ref 0–40)
Albumin/Globulin Ratio: 2.3 — ABNORMAL HIGH (ref 1.2–2.2)
Albumin: 4.6 g/dL (ref 3.5–4.8)
Alkaline Phosphatase: 67 IU/L (ref 39–117)
BUN/Creatinine Ratio: 24 (ref 12–28)
BUN: 15 mg/dL (ref 8–27)
Bilirubin Total: 0.7 mg/dL (ref 0.0–1.2)
CO2: 24 mmol/L (ref 20–29)
Calcium: 9.9 mg/dL (ref 8.7–10.3)
Chloride: 99 mmol/L (ref 96–106)
Creatinine, Ser: 0.63 mg/dL (ref 0.57–1.00)
GFR calc Af Amer: 101 mL/min/{1.73_m2} (ref >59)
GFR calc non Af Amer: 87 mL/min/{1.73_m2} (ref >59)
Globulin, Total: 2 g/dL (ref 1.5–4.5)
Glucose: 106 mg/dL — ABNORMAL HIGH (ref 65–99)
Potassium: 4.9 mmol/L (ref 3.5–5.2)
Sodium: 139 mmol/L (ref 134–144)
Total Protein: 6.6 g/dL (ref 6.0–8.5)

## 2018-05-16 LAB — HEMOGLOBIN A1C
Est. average glucose Bld gHb Est-mCnc: 114 mg/dL
Hgb A1c MFr Bld: 5.6 % (ref 4.8–5.6)

## 2018-05-16 LAB — MAGNESIUM: Magnesium: 2 mg/dL (ref 1.6–2.3)

## 2018-05-16 LAB — TSH: TSH: 2.25 u[IU]/mL (ref 0.450–4.500)

## 2018-05-22 ENCOUNTER — Telehealth: Payer: Self-pay | Admitting: Cardiovascular Disease

## 2018-05-22 NOTE — Telephone Encounter (Signed)
Routed to [facility] pending provider review

## 2018-05-22 NOTE — Telephone Encounter (Signed)
New Message;  Patient calling would like to know some results.

## 2018-05-27 NOTE — Telephone Encounter (Signed)
Labs released in [place] with Dr Blenda Mounts comments attached

## 2018-06-03 DIAGNOSIS — M67961 Unspecified disorder of synovium and tendon, right lower leg: Secondary | ICD-10-CM | POA: Diagnosis not present

## 2018-06-04 ENCOUNTER — Telehealth: Payer: Self-pay | Admitting: *Deleted

## 2018-06-04 MED ORDER — HYDROCHLOROTHIAZIDE 25 MG PO TABS: 25.0000 mg | ORAL_TABLET | Freq: Every day | ORAL | 3 refills | Status: DC

## 2018-06-04 NOTE — Telephone Encounter (Signed)
Reviewed labs with patient. She requested refill of HCTZ, sent to pharmacy as requested

## 2018-06-13 DIAGNOSIS — M67961 Unspecified disorder of synovium and tendon, right lower leg: Secondary | ICD-10-CM | POA: Diagnosis not present

## 2018-06-16 DIAGNOSIS — R87810 Cervical high risk human papillomavirus (HPV) DNA test positive: Secondary | ICD-10-CM | POA: Diagnosis not present

## 2018-06-16 DIAGNOSIS — Z9189 Other specified personal risk factors, not elsewhere classified: Secondary | ICD-10-CM | POA: Diagnosis not present

## 2018-06-16 DIAGNOSIS — Z01419 Encounter for gynecological examination (general) (routine) without abnormal findings: Secondary | ICD-10-CM | POA: Diagnosis not present

## 2018-06-16 DIAGNOSIS — Z23 Encounter for immunization: Secondary | ICD-10-CM | POA: Diagnosis not present

## 2018-07-24 ENCOUNTER — Other Ambulatory Visit: Payer: Self-pay | Admitting: Obstetrics & Gynecology

## 2018-07-24 DIAGNOSIS — Z1231 Encounter for screening mammogram for malignant neoplasm of breast: Secondary | ICD-10-CM

## 2018-08-28 ENCOUNTER — Ambulatory Visit
Admission: RE | Admit: 2018-08-28 | Discharge: 2018-08-28 | Disposition: A | Discharge status: Home / Self Care | Payer: Medicare Other | Source: Ambulatory Visit | Attending: Obstetrics & Gynecology | Admitting: Obstetrics & Gynecology

## 2018-08-28 DIAGNOSIS — Z1231 Encounter for screening mammogram for malignant neoplasm of breast: Secondary | ICD-10-CM | POA: Diagnosis not present

## 2018-09-04 ENCOUNTER — Ambulatory Visit: Payer: Medicare Other

## 2018-09-15 DIAGNOSIS — Z1389 Encounter for screening for other disorder: Secondary | ICD-10-CM | POA: Diagnosis not present

## 2018-09-15 DIAGNOSIS — Z0000 Encounter for general adult medical examination without abnormal findings: Secondary | ICD-10-CM | POA: Diagnosis not present

## 2018-09-15 DIAGNOSIS — I10 Essential (primary) hypertension: Secondary | ICD-10-CM | POA: Diagnosis not present

## 2018-09-15 DIAGNOSIS — I480 Paroxysmal atrial fibrillation: Secondary | ICD-10-CM | POA: Diagnosis not present

## 2018-10-27 ENCOUNTER — Other Ambulatory Visit: Payer: Self-pay | Admitting: Cardiovascular Disease

## 2018-10-27 NOTE — Telephone Encounter (Signed)
Pt is a 55 yr.  Old female who saw Dr. Oval Linsey on 05/14/18, wt at that visit was 58.2Kg. SCr on 05/15/18 was 0.63. Will refill Eliquis 5mg  BID.

## 2018-11-06 ENCOUNTER — Telehealth: Payer: Self-pay | Admitting: Cardiovascular Disease

## 2018-11-06 NOTE — Telephone Encounter (Signed)
° ° °  Patient calling the office for samples of medication: ° ° °1.  What medication and dosage are you requesting samples for? °ELIQUIS 5 MG TABS tablet ° °2.  Are you currently out of this medication? yes ° ° °

## 2018-11-06 NOTE — Telephone Encounter (Signed)
Pt aware samples left at front desk (2 weeks worth ) .Ashlee Flores

## 2018-12-29 DIAGNOSIS — M6740 Ganglion, unspecified site: Secondary | ICD-10-CM | POA: Diagnosis not present

## 2018-12-29 DIAGNOSIS — D1801 Hemangioma of skin and subcutaneous tissue: Secondary | ICD-10-CM | POA: Diagnosis not present

## 2018-12-29 DIAGNOSIS — D2261 Melanocytic nevi of right upper limb, including shoulder: Secondary | ICD-10-CM | POA: Diagnosis not present

## 2018-12-29 DIAGNOSIS — L821 Other seborrheic keratosis: Secondary | ICD-10-CM | POA: Diagnosis not present

## 2018-12-29 DIAGNOSIS — D485 Neoplasm of uncertain behavior of skin: Secondary | ICD-10-CM | POA: Diagnosis not present

## 2018-12-29 DIAGNOSIS — Z85828 Personal history of other malignant neoplasm of skin: Secondary | ICD-10-CM | POA: Diagnosis not present

## 2018-12-29 DIAGNOSIS — D2262 Melanocytic nevi of left upper limb, including shoulder: Secondary | ICD-10-CM | POA: Diagnosis not present

## 2018-12-29 DIAGNOSIS — L820 Inflamed seborrheic keratosis: Secondary | ICD-10-CM | POA: Diagnosis not present

## 2019-03-09 ENCOUNTER — Telehealth: Payer: Self-pay | Admitting: Cardiovascular Disease

## 2019-03-09 ENCOUNTER — Other Ambulatory Visit: Payer: Self-pay | Admitting: Cardiovascular Disease

## 2019-03-09 MED ORDER — APIXABAN 5 MG PO TABS: ORAL_TABLET | ORAL | 1 refills | Status: DC

## 2019-03-09 NOTE — Telephone Encounter (Signed)
New Message   Patient calling the office for samples of medication:   1.  What medication and dosage are you requesting samples for? ELIQUIS 5 MG TABS tablet   2.  Are you currently out of this medication? No, 7 days remaining

## 2019-03-09 NOTE — Telephone Encounter (Signed)
New Message    *STAT* If patient is at the pharmacy, call can be transferred to refill team.   1. Which medications need to be refilled? (please list name of each medication and dose if known) ELIQUIS 5 MG TABS tablet   2. Which pharmacy/location (including street and city if local pharmacy) is medication to be sent to? [place], [place]  3. Do they need a 30 day or 90 day supply? [place]

## 2019-03-09 NOTE — Telephone Encounter (Signed)
No eliquis samples

## 2019-03-09 NOTE — Telephone Encounter (Signed)
Pt notified, she states that she doesn't need any she found her medication. Refill sent to requested pharmacy

## 2019-03-10 MED ORDER — APIXABAN 5 MG PO TABS: ORAL_TABLET | ORAL | 0 refills | Status: DC

## 2019-03-24 DIAGNOSIS — F320 Major depressive disorder, single episode, mild: Secondary | ICD-10-CM | POA: Diagnosis not present

## 2019-03-24 DIAGNOSIS — K580 Irritable bowel syndrome with diarrhea: Secondary | ICD-10-CM | POA: Diagnosis not present

## 2019-04-17 ENCOUNTER — Other Ambulatory Visit: Payer: Self-pay

## 2019-04-17 MED ORDER — HYDROCHLOROTHIAZIDE 25 MG PO TABS: 25.0000 mg | ORAL_TABLET | Freq: Every day | ORAL | 1 refills | Status: AC

## 2019-05-12 DIAGNOSIS — R197 Diarrhea, unspecified: Secondary | ICD-10-CM | POA: Diagnosis not present

## 2019-05-13 DIAGNOSIS — R197 Diarrhea, unspecified: Secondary | ICD-10-CM | POA: Diagnosis not present

## 2019-05-19 ENCOUNTER — Telehealth: Payer: Self-pay | Admitting: Cardiovascular Disease

## 2019-05-19 ENCOUNTER — Other Ambulatory Visit: Payer: Self-pay | Admitting: Cardiovascular Disease

## 2019-05-19 MED ORDER — APIXABAN 5 MG PO TABS: ORAL_TABLET | ORAL | 0 refills | Status: DC

## 2019-05-19 NOTE — Telephone Encounter (Signed)
Spoke to patient - no samples available. RN INFORMED PATIENT office can send  Certain amt to local pharmacy for patient to purchase. Patient states she will call back - if need to purchase  eliquis  Until mail orde comes in to pharmacy

## 2019-05-19 NOTE — Telephone Encounter (Signed)
Please review for refill. Thank you! 

## 2019-05-19 NOTE — Telephone Encounter (Signed)
Patient calling the office for samples of medication:   1.  What medication and dosage are you requesting samples for? apixaban (ELIQUIS) 5 MG TABS tablet  2.  Are you currently out of this medication? No, has a few days left.

## 2019-05-19 NOTE — Telephone Encounter (Signed)
°*  STAT* If patient is at the pharmacy, call can be transferred to refill team.   1. Which medications need to be refilled? (please list name of each medication and dose if known) apixaban (ELIQUIS) 5 MG TABS tablet  2. Which pharmacy/location (including street and city if local pharmacy) is medication to be sent to? [place], [place]  3. Do they need a 30 day or 90 day supply? 90 day  Patient only has a few days left

## 2019-06-18 DIAGNOSIS — B977 Papillomavirus as the cause of diseases classified elsewhere: Secondary | ICD-10-CM | POA: Diagnosis not present

## 2019-06-18 DIAGNOSIS — H40013 Open angle with borderline findings, low risk, bilateral: Secondary | ICD-10-CM | POA: Diagnosis not present

## 2019-06-18 DIAGNOSIS — Z9189 Other specified personal risk factors, not elsewhere classified: Secondary | ICD-10-CM | POA: Diagnosis not present

## 2019-06-22 DIAGNOSIS — Z23 Encounter for immunization: Secondary | ICD-10-CM | POA: Diagnosis not present

## 2019-07-16 ENCOUNTER — Other Ambulatory Visit: Payer: Self-pay | Admitting: Family Medicine

## 2019-07-16 DIAGNOSIS — E2839 Other primary ovarian failure: Secondary | ICD-10-CM

## 2019-07-22 ENCOUNTER — Ambulatory Visit: Payer: Medicare Other | Admitting: Cardiovascular Disease

## 2019-08-27 ENCOUNTER — Ambulatory Visit (INDEPENDENT_AMBULATORY_CARE_PROVIDER_SITE_OTHER): Payer: Medicare Other | Admitting: Cardiovascular Disease

## 2019-08-27 ENCOUNTER — Other Ambulatory Visit: Payer: Self-pay

## 2019-08-27 ENCOUNTER — Encounter: Payer: Self-pay | Admitting: Cardiovascular Disease

## 2019-08-27 VITALS — BP 130/70 | HR 62 | Temp 97.7°F | Ht 63.0 in | Wt 126.0 lb

## 2019-08-27 DIAGNOSIS — I10 Essential (primary) hypertension: Secondary | ICD-10-CM

## 2019-08-27 DIAGNOSIS — Z5181 Encounter for therapeutic drug level monitoring: Secondary | ICD-10-CM

## 2019-08-27 DIAGNOSIS — I480 Paroxysmal atrial fibrillation: Secondary | ICD-10-CM

## 2019-08-27 MED ORDER — APIXABAN 5 MG PO TABS: ORAL_TABLET | ORAL | 3 refills | Status: DC

## 2019-08-27 NOTE — Progress Notes (Signed)
Cardiology Office Note   Date:  08/27/2019   ID:  Ashlee Flores, DOB September 08, 1941, MRN [id number]  PCP:  Jonathon Jordan, MD  Cardiologist:   Skeet Latch, MD   No chief complaint on file.   History of Present Illness: Ashlee Flores is a 78 y.o. female with paroxysmal atrial fibrillation, grade 2 diastolic dysfunction, and hypertension who presents for follow-up. Ashlee Flores saw Dr. Jonathon Jordan on 04/16/16. At that time she reported an episode of irregular heart rate that occurred while resting.  She wore a 30 day event monitor 07/2016 that showed atrial fibrillation with rates up to 160 bpm. Thyroid function and basic metabolic panel were normal.  She was started on Eliquis but takes metoprolol as needed due to baseline bradycardia. She had an echo 09/17/16 that revealed LVEF 55-60% with grade 2 diastolic dysfunction and mild aortic regurgitation.  PASP was mildly elevated at 44 mmHg.   Ashlee Flores has been doing well.  She rarely has episodes of atrial fibrillation.  She had an episode two nights ago.  She has determined that it occurs when her stomach is upset.  Typically when these episodes do not last very long.  She had one episode that lasted for several hours.  When in atrial fibrillation she can feel it everywhere and is very uneasy.  She is unable to lay down.  Heart rate goes into the 80s or 90s.  At baseline her heart rate is in the 50s or 60s.  She continues to walk every day for between 2 and 5 miles.  She has no exertional chest pain or shortness of breath.  She also denies lower extremity edema, orthopnea, PND.  Overall she has been doing well and is without complaint.   Past Medical History:  Diagnosis Date  . 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 Diastolic dysfunction [id number]  . Essential hypertension 06/04/2016  . Family history of breast cancer   . Family history of ovarian cancer   . Palpitations 06/04/2016  . Paroxysmal atrial fibrillation ([place])  08/22/2016    Past Surgical History:  Procedure Laterality Date  . BREAST EXCISIONAL BIOPSY Right   . SALPINGOOPHORECTOMY     preventative, due to [place] [facility] of ovarian cancer     Current Outpatient Medications  Medication Sig Dispense Refill  . apixaban (ELIQUIS) 5 MG TABS tablet TAKE 1 TABLET (5 MG TOTAL) BY MOUTH 2 (TWO) TIMES DAILY. 180 tablet 0  . hydrochlorothiazide (HYDRODIURIL) 25 MG tablet Take 1 tablet (25 mg total) by mouth daily. 90 tablet 1  . lisinopril (PRINIVIL,ZESTRIL) 10 MG tablet Take 10 mg by mouth daily.     . metoprolol tartrate (LOPRESSOR) 25 MG tablet 1/2 TABLET AS NEEDED AT ONSET OF AFIB, OK TO TAKE ADDITIONAL 1/2 TAB IF HEART RATE STILL ELEVATED AFTER A COUPLE OF HOURS 30 tablet 2  . potassium chloride SA (K-DUR,KLOR-CON) 20 MEQ tablet Take 1 tablet (20 mEq total) by mouth 2 (two) times daily. 180 tablet 3   No current facility-administered medications for this visit.    Allergies:   Penicillins    Social History:  The patient  reports that she has never smoked. She has never used smokeless tobacco. She reports current alcohol use. She reports that she does not use drugs.   Family History:  The patient's family history includes Bone cancer in her paternal uncle; Breast cancer in her cousin, maternal grandmother, and paternal aunt; Breast cancer (age of onset:  84) in her maternal aunt; Breast cancer (age of onset: 42) in her mother; Heart attack in her father, maternal aunt, maternal grandfather, and maternal uncle; Hypertension in her mother; Melanoma (age of onset: 61) in her daughter; Ovarian cancer in her maternal grandmother; Ovarian cancer (age of onset: 77) in her sister; Prostate cancer (age of onset: 58) in her cousin.    ROS:  Please see the history of present illness.   Otherwise, review of systems are positive for none.   All other systems are reviewed and negative.    PHYSICAL EXAM: VS:  BP 132/68   Pulse 62   Temp 97.7 F (36.5 C)   Ht 5\' 3"  (1.6  m)   Wt 126 lb (57.2 kg)   SpO2 100%   BMI 22.32 kg/m  , BMI Body mass index is 22.32 kg/m. GENERAL:  Well appearing HEENT: Pupils equal round and reactive, fundi not visualized, oral mucosa unremarkable NECK:  No jugular venous distention, waveform within normal limits, carotid upstroke brisk and symmetric, no bruits LUNGS:  Clear to auscultation bilaterally HEART:  RRR.  PMI not displaced or sustained,S1 and S2 within normal limits, no S3, no S4, no clicks, no rubs, no murmurs ABD:  Flat, positive bowel sounds normal in frequency in pitch, no bruits, no rebound, no guarding, no midline pulsatile mass, no hepatomegaly, no splenomegaly EXT:  2 plus pulses throughout, no edema, no cyanosis no clubbing SKIN:  No rashes no nodules NEURO:  Cranial nerves II through XII grossly intact, motor grossly intact throughout PSYCH:  Cognitively intact, oriented to person place and time   EKG:  EKG is ordered today. The ekg ordered 06/04/16 demonstrates sinus bradycardia rate 58 bpm.  10/12/16: Sinus bradycardia.  Rate 57 bpm.  03/26/17: Sinus bradycardia. Rate 54 bpm. 05/14/2018: Sinus bradycardia.  Rate 57 bpm. 08/27/19: Sinus rhythm.  Rate 62 bpm.  30 Day Event Monitor 08/22/16:  Quality: Fair.  Baseline artifact. Predominant rhythm: Sinus bradycardia  Max heart rate: 160 bpm Min heart rate: 45 bpm Atrial fibrillation was noted with rates up to 160 bpm.  Echo 09/17/16: Study Conclusions  - Left ventricle: The cavity size was normal. Wall thickness was   normal. Systolic function was normal. The estimated ejection   fraction was in the range of 55% to 60%. Wall motion was normal;   there were no regional wall motion abnormalities. Features are   consistent with a pseudonormal left ventricular filling pattern,   with concomitant abnormal relaxation and increased filling   pressure (grade 2 diastolic dysfunction). - Aortic valve: There was no stenosis. There was mild   regurgitation. -  Mitral valve: There was trivial regurgitation. - Left atrium: The atrium was mildly dilated. - Right ventricle: The cavity size was normal. Systolic function   was normal. - Tricuspid valve: Peak RV-RA gradient (S): 29 mm Hg. - Pulmonary arteries: PA peak pressure: 44 mm Hg (S). - Systemic veins: IVC measured 2.2 cm with < 50% respirophasic   variation, suggesting RA pressure 15 mmHg.  Impressions:  - Normal LV size with EF 55-60%. Moderate diastolic dysfunction.   Normal RV size and systolic function. Mild aortic insufficiency.   Mild pulmonary hypertension.   Recent Labs: No results found for requested labs within last 8760 hours.   04/16/16: WBC 4.6, hemoglobin 14.6, hematocrit 42, platelets 212  12/06/16: Sodium 136, potassium 3.2, BUN 15, creatinine 0.79 AST 17, ALT 17 WBC 5.8, hemoglobin 13.8, hematocrit 38.8, platelets 227  Lipid Panel  Component Value Date/Time   CHOL 181 05/15/2018 0918   TRIG 55 05/15/2018 0918   HDL 77 05/15/2018 0918   CHOLHDL 2.4 05/15/2018 0918   LDLCALC 93 05/15/2018 0918      Wt Readings from Last 3 Encounters:  08/27/19 126 lb (57.2 kg)  05/14/18 128 lb 3.2 oz (58.2 kg)  03/26/17 133 lb 6.4 oz (60.5 kg)      ASSESSMENT AND PLAN:  # Paroxysmal atrial fibrillation:  Overall atrial fibrillation is well-controlled.  She has rare episodes and when she does have A. fib her rates are well-controlled.  Continue Eliquis.  She takes metoprolol as needed for A. fib episodes and it seems to help well.  Check CBC.  This patients CHA2DS2-VASc Score and unadjusted Ischemic Stroke Rate (% per year) is equal to 3.2 % stroke rate/year from a score of 3  Above score calculated as 1 point each if present [CHF, HTN, DM, Vascular=MI/PAD/Aortic Plaque, Age if 65-74, or Female] Above score calculated as 2 points each if present [Age > 75, or Stroke/TIA/TE]  # Hypertension: Blood pressure is well controlled and has been even lower at home.  Continue  hydrochlorothiazide and lisinopril.  Check CMP.  # CV Disease Prevention: Continue daily exercise.  Check lipids and CMP.  Current medicines are reviewed at length with the patient today.  The patient does not have concerns regarding medicines.  The following changes have been made:  none  Labs/ tests ordered today include:   No orders of the defined types were placed in this encounter.    Disposition:   FU with Rhona Fusilier C. Oval Linsey, MD, [facility] in 1 year.     Signed, Jequan Shahin C. Oval Linsey, MD, [facility]  08/27/2019 8:47 AM    [facility]

## 2019-08-27 NOTE — Patient Instructions (Signed)
Medication Instructions:  Your physician recommends that you continue on your current medications as directed. Please refer to the Current Medication list given to you today.  *If you need a refill on your cardiac medications before your next appointment, please call your pharmacy*  Lab Work: FASTING LP/CMET/CBC SOON  If you have labs (blood work) drawn today and your tests are completely normal, you will receive your results only by: Marland Kitchen MyChart Message (if you have MyChart) OR . A paper copy in the mail If you have any lab test that is abnormal or we need to change your treatment, we will call you to review the results.  Testing/Procedures: NONE   Follow-Up: At [facility], you and your health needs are our priority.  As part of our continuing mission to provide you with exceptional heart care, we have created designated Provider Care Teams.  These Care Teams include your primary Cardiologist (physician) and Advanced Practice Providers (APPs -  Physician Assistants and Nurse Practitioners) who all work together to provide you with the care you need, when you need it.  Your next appointment:   12 month(s) You will receive a reminder letter in the mail two months in advance. If you don't receive a letter, please call our office to schedule the follow-up appointment.  The format for your next appointment:   Either In Person or Virtual  Provider:   You may see Skeet Latch, MD or one of the following Advanced Practice Providers on your designated Care Team:    Kerin Ransom, PA-C  [place], [place]  Coletta Memos, [place]

## 2019-09-01 ENCOUNTER — Telehealth: Payer: Self-pay | Admitting: Cardiovascular Disease

## 2019-09-01 LAB — COMPREHENSIVE METABOLIC PANEL
ALT: 21 IU/L (ref 0–32)
AST: 21 IU/L (ref 0–40)
Albumin/Globulin Ratio: 2.1 (ref 1.2–2.2)
Albumin: 4.4 g/dL (ref 3.7–4.7)
Alkaline Phosphatase: 74 IU/L (ref 39–117)
BUN/Creatinine Ratio: 19 (ref 12–28)
BUN: 13 mg/dL (ref 8–27)
Bilirubin Total: 0.9 mg/dL (ref 0.0–1.2)
CO2: 23 mmol/L (ref 20–29)
Calcium: 9.9 mg/dL (ref 8.7–10.3)
Chloride: 100 mmol/L (ref 96–106)
Creatinine, Ser: 0.68 mg/dL (ref 0.57–1.00)
GFR calc Af Amer: 98 mL/min/{1.73_m2} (ref >59)
GFR calc non Af Amer: 85 mL/min/{1.73_m2} (ref >59)
Globulin, Total: 2.1 g/dL (ref 1.5–4.5)
Glucose: 106 mg/dL — ABNORMAL HIGH (ref 65–99)
Potassium: 3.9 mmol/L (ref 3.5–5.2)
Sodium: 139 mmol/L (ref 134–144)
Total Protein: 6.5 g/dL (ref 6.0–8.5)

## 2019-09-01 LAB — LIPID PANEL
Chol/HDL Ratio: 2.3 ratio (ref 0.0–4.4)
Cholesterol, Total: 189 mg/dL (ref 100–199)
HDL: 84 mg/dL (ref >39)
LDL Chol Calc (NIH): 92 mg/dL (ref 0–99)
Triglycerides: 73 mg/dL (ref 0–149)
VLDL Cholesterol Cal: 13 mg/dL (ref 5–40)

## 2019-09-01 LAB — CBC WITH DIFFERENTIAL/PLATELET
Basophils Absolute: 0 10*3/uL (ref 0.0–0.2)
Basos: 0 %
EOS (ABSOLUTE): 0.1 10*3/uL (ref 0.0–0.4)
Eos: 2 %
Hematocrit: 41.2 % (ref 34.0–46.6)
Hemoglobin: 14.2 g/dL (ref 11.1–15.9)
Immature Grans (Abs): 0 10*3/uL (ref 0.0–0.1)
Immature Granulocytes: 1 %
Lymphocytes Absolute: 1.9 10*3/uL (ref 0.7–3.1)
Lymphs: 38 %
MCH: 31.6 pg (ref 26.6–33.0)
MCHC: 34.5 g/dL (ref 31.5–35.7)
MCV: 92 fL (ref 79–97)
Monocytes Absolute: 0.6 10*3/uL (ref 0.1–0.9)
Monocytes: 12 %
Neutrophils Absolute: 2.4 10*3/uL (ref 1.4–7.0)
Neutrophils: 47 %
Platelets: 197 10*3/uL (ref 150–450)
RBC: 4.5 x10E6/uL (ref 3.77–5.28)
RDW: 13.4 % (ref 11.7–15.4)
WBC: 5 10*3/uL (ref 3.4–10.8)

## 2019-09-01 NOTE — Telephone Encounter (Signed)
Patient calling for EKG results. States they were sent to her MyChart but she needs help understanding them.

## 2019-09-01 NOTE — Telephone Encounter (Signed)
Spoke with pt, questions regarding ecg answered.

## 2019-09-29 ENCOUNTER — Other Ambulatory Visit: Payer: Self-pay | Admitting: Obstetrics & Gynecology

## 2019-09-29 DIAGNOSIS — Z1231 Encounter for screening mammogram for malignant neoplasm of breast: Secondary | ICD-10-CM

## 2019-10-05 ENCOUNTER — Other Ambulatory Visit: Payer: Self-pay

## 2019-10-05 ENCOUNTER — Ambulatory Visit
Admission: RE | Admit: 2019-10-05 | Discharge: 2019-10-05 | Disposition: A | Discharge status: Home / Self Care | Payer: Medicare Other | Source: Ambulatory Visit | Attending: Obstetrics & Gynecology | Admitting: Obstetrics & Gynecology

## 2019-10-05 DIAGNOSIS — Z1231 Encounter for screening mammogram for malignant neoplasm of breast: Secondary | ICD-10-CM | POA: Diagnosis not present

## 2019-10-09 ENCOUNTER — Other Ambulatory Visit: Payer: Self-pay

## 2019-10-09 ENCOUNTER — Ambulatory Visit
Admission: RE | Admit: 2019-10-09 | Discharge: 2019-10-09 | Disposition: A | Discharge status: Home / Self Care | Payer: Medicare Other | Source: Ambulatory Visit | Attending: Family Medicine | Admitting: Family Medicine

## 2019-10-09 DIAGNOSIS — Z13820 Encounter for screening for osteoporosis: Secondary | ICD-10-CM | POA: Diagnosis not present

## 2019-10-09 DIAGNOSIS — E2839 Other primary ovarian failure: Secondary | ICD-10-CM

## 2019-10-09 DIAGNOSIS — Z780 Asymptomatic menopausal state: Secondary | ICD-10-CM | POA: Diagnosis not present

## 2019-10-28 DIAGNOSIS — Z0000 Encounter for general adult medical examination without abnormal findings: Secondary | ICD-10-CM | POA: Diagnosis not present

## 2019-10-28 DIAGNOSIS — I272 Pulmonary hypertension, unspecified: Secondary | ICD-10-CM | POA: Diagnosis not present

## 2019-10-28 DIAGNOSIS — D692 Other nonthrombocytopenic purpura: Secondary | ICD-10-CM | POA: Diagnosis not present

## 2019-10-28 DIAGNOSIS — Z79899 Other long term (current) drug therapy: Secondary | ICD-10-CM | POA: Diagnosis not present

## 2019-10-28 DIAGNOSIS — E559 Vitamin D deficiency, unspecified: Secondary | ICD-10-CM | POA: Diagnosis not present

## 2019-10-28 DIAGNOSIS — Z8041 Family history of malignant neoplasm of ovary: Secondary | ICD-10-CM | POA: Diagnosis not present

## 2019-10-28 DIAGNOSIS — I10 Essential (primary) hypertension: Secondary | ICD-10-CM | POA: Diagnosis not present

## 2019-10-28 DIAGNOSIS — F320 Major depressive disorder, single episode, mild: Secondary | ICD-10-CM | POA: Diagnosis not present

## 2019-10-28 DIAGNOSIS — D352 Benign neoplasm of pituitary gland: Secondary | ICD-10-CM | POA: Diagnosis not present

## 2019-10-28 DIAGNOSIS — Z853 Personal history of malignant neoplasm of breast: Secondary | ICD-10-CM | POA: Diagnosis not present

## 2019-10-28 DIAGNOSIS — I480 Paroxysmal atrial fibrillation: Secondary | ICD-10-CM | POA: Diagnosis not present

## 2019-11-16 DIAGNOSIS — Z23 Encounter for immunization: Secondary | ICD-10-CM | POA: Diagnosis not present

## 2019-11-16 DIAGNOSIS — I10 Essential (primary) hypertension: Secondary | ICD-10-CM | POA: Diagnosis not present

## 2019-11-16 DIAGNOSIS — Z79899 Other long term (current) drug therapy: Secondary | ICD-10-CM | POA: Diagnosis not present

## 2019-11-16 DIAGNOSIS — E559 Vitamin D deficiency, unspecified: Secondary | ICD-10-CM | POA: Diagnosis not present

## 2019-12-13 ENCOUNTER — Other Ambulatory Visit: Payer: Self-pay

## 2019-12-13 ENCOUNTER — Ambulatory Visit (HOSPITAL_COMMUNITY)
Admission: EM | Admit: 2019-12-13 | Discharge: 2019-12-13 | Disposition: A | Discharge status: Home / Self Care | Payer: Medicare Other | Source: Home / Self Care

## 2019-12-13 ENCOUNTER — Emergency Department (HOSPITAL_COMMUNITY): Payer: Medicare Other

## 2019-12-13 ENCOUNTER — Encounter (HOSPITAL_COMMUNITY): Payer: Self-pay

## 2019-12-13 ENCOUNTER — Emergency Department (HOSPITAL_COMMUNITY)
Admission: EM | Admit: 2019-12-13 | Discharge: 2019-12-13 | Disposition: A | Discharge status: Home / Self Care | Payer: Medicare Other | Attending: Emergency Medicine | Admitting: Emergency Medicine

## 2019-12-13 DIAGNOSIS — S60511A Abrasion of right hand, initial encounter: Secondary | ICD-10-CM | POA: Insufficient documentation

## 2019-12-13 DIAGNOSIS — S60512A Abrasion of left hand, initial encounter: Secondary | ICD-10-CM | POA: Diagnosis not present

## 2019-12-13 DIAGNOSIS — S0012XA Contusion of left eyelid and periocular area, initial encounter: Secondary | ICD-10-CM | POA: Diagnosis not present

## 2019-12-13 DIAGNOSIS — M25561 Pain in right knee: Secondary | ICD-10-CM | POA: Diagnosis not present

## 2019-12-13 DIAGNOSIS — S0990XA Unspecified injury of head, initial encounter: Secondary | ICD-10-CM | POA: Diagnosis not present

## 2019-12-13 DIAGNOSIS — Y929 Unspecified place or not applicable: Secondary | ICD-10-CM | POA: Insufficient documentation

## 2019-12-13 DIAGNOSIS — S199XXA Unspecified injury of neck, initial encounter: Secondary | ICD-10-CM | POA: Diagnosis not present

## 2019-12-13 DIAGNOSIS — W101XXA Fall (on)(from) sidewalk curb, initial encounter: Secondary | ICD-10-CM | POA: Insufficient documentation

## 2019-12-13 DIAGNOSIS — S8002XA Contusion of left knee, initial encounter: Secondary | ICD-10-CM | POA: Insufficient documentation

## 2019-12-13 DIAGNOSIS — S62524A Nondisplaced fracture of distal phalanx of right thumb, initial encounter for closed fracture: Secondary | ICD-10-CM | POA: Insufficient documentation

## 2019-12-13 DIAGNOSIS — S0011XA Contusion of right eyelid and periocular area, initial encounter: Secondary | ICD-10-CM | POA: Insufficient documentation

## 2019-12-13 DIAGNOSIS — I10 Essential (primary) hypertension: Secondary | ICD-10-CM | POA: Diagnosis not present

## 2019-12-13 DIAGNOSIS — S8001XA Contusion of right knee, initial encounter: Secondary | ICD-10-CM | POA: Diagnosis not present

## 2019-12-13 DIAGNOSIS — Z23 Encounter for immunization: Secondary | ICD-10-CM | POA: Diagnosis not present

## 2019-12-13 DIAGNOSIS — S0031XA Abrasion of nose, initial encounter: Secondary | ICD-10-CM | POA: Diagnosis not present

## 2019-12-13 DIAGNOSIS — S0083XA Contusion of other part of head, initial encounter: Secondary | ICD-10-CM | POA: Diagnosis not present

## 2019-12-13 DIAGNOSIS — M79642 Pain in left hand: Secondary | ICD-10-CM | POA: Diagnosis not present

## 2019-12-13 DIAGNOSIS — S0081XA Abrasion of other part of head, initial encounter: Secondary | ICD-10-CM | POA: Diagnosis not present

## 2019-12-13 DIAGNOSIS — Y999 Unspecified external cause status: Secondary | ICD-10-CM | POA: Insufficient documentation

## 2019-12-13 DIAGNOSIS — Y9301 Activity, walking, marching and hiking: Secondary | ICD-10-CM | POA: Insufficient documentation

## 2019-12-13 DIAGNOSIS — Z853 Personal history of malignant neoplasm of breast: Secondary | ICD-10-CM | POA: Insufficient documentation

## 2019-12-13 DIAGNOSIS — S8992XA Unspecified injury of left lower leg, initial encounter: Secondary | ICD-10-CM | POA: Diagnosis not present

## 2019-12-13 DIAGNOSIS — S0993XA Unspecified injury of face, initial encounter: Secondary | ICD-10-CM | POA: Diagnosis present

## 2019-12-13 DIAGNOSIS — S8991XA Unspecified injury of right lower leg, initial encounter: Secondary | ICD-10-CM | POA: Diagnosis not present

## 2019-12-13 DIAGNOSIS — M25562 Pain in left knee: Secondary | ICD-10-CM | POA: Diagnosis not present

## 2019-12-13 DIAGNOSIS — Z79899 Other long term (current) drug therapy: Secondary | ICD-10-CM | POA: Insufficient documentation

## 2019-12-13 DIAGNOSIS — Z7901 Long term (current) use of anticoagulants: Secondary | ICD-10-CM | POA: Insufficient documentation

## 2019-12-13 DIAGNOSIS — S6992XA Unspecified injury of left wrist, hand and finger(s), initial encounter: Secondary | ICD-10-CM | POA: Diagnosis not present

## 2019-12-13 DIAGNOSIS — S62521A Displaced fracture of distal phalanx of right thumb, initial encounter for closed fracture: Secondary | ICD-10-CM | POA: Diagnosis not present

## 2019-12-13 MED ORDER — TETANUS-DIPHTH-ACELL PERTUSSIS 5-2.5-18.5 LF-MCG/0.5 IM SUSP
0.5000 mL | Freq: Once | INTRAMUSCULAR | Status: AC
  Administered 2019-12-13: 0.5 mL via INTRAMUSCULAR
  Filled 2019-12-13: qty 0.5

## 2019-12-13 MED ORDER — BACITRACIN ZINC 500 UNIT/GM EX OINT: 1.0000 "application " | TOPICAL_OINTMENT | Freq: Once | CUTANEOUS | Status: DC

## 2019-12-13 NOTE — ED Notes (Signed)
Patient transported to CT 

## 2019-12-13 NOTE — ED Triage Notes (Signed)
Onset yesterday pt was walking and tripped on curb, fell forward on left side of face, hands, and knees.   Abrasions on left side of forehead, down nose, on left palm of hand.  Eyes swollen and bruised, L>R.   Right thumb has skin tear at edge of nailbed, bleeding controlled.   No LoC.

## 2019-12-13 NOTE — Discharge Instructions (Addendum)
Ice to help with swelling, take over-the-counter medications as needed for pain, follow-up with your doctor as we discussed if your hand and neck symptoms worsen, return to the ED if you start having weakness in your arms/hands

## 2019-12-13 NOTE — ED Notes (Signed)
Family at bedside. 

## 2019-12-13 NOTE — ED Notes (Signed)
Patient transported to X-ray 

## 2019-12-13 NOTE — ED Provider Notes (Signed)
[facility] EMERGENCY DEPARTMENT Provider Note   CSN: [id number] Arrival date & time: 12/13/19  1307     History Chief Complaint  Patient presents with  . Fall  . Abrasion  . Facial Swelling    Ashlee Flores is a 79 y.o. female.  HPI   Patient presented to the emergency room for evaluation of injuries associated with a fall.  Patient was walking yesterday when she tripped and stumbled landing forward on her knees hands and face.  Patient ended up asking her neighbor who is a neonatologist to check her out this morning.  Her neighbor recommended she come to the ED for further evaluation.  Patient does take Eliquis.  Patient did notice increasing facial bruising and swelling today.  She is also having stinging pain in her hands bilaterally.  She is also having bruising and swelling in both of her knees.  Patient is able to walk.  She is not having a severe headache.  No blurred vision.  No numbness or weakness.  No nausea or vomiting.  Past Medical History:  Diagnosis Date  . 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 Diastolic dysfunction [id number]  . Essential hypertension 06/04/2016  . Family history of breast cancer   . Family history of ovarian cancer   . Palpitations 06/04/2016  . Paroxysmal atrial fibrillation ([place]) 08/22/2016    Patient Active Problem List   Diagnosis Date Noted  . Diastolic dysfunction 99991111  . Paroxysmal atrial fibrillation ([place]) 08/22/2016  . Essential hypertension 06/04/2016  . Palpitations 06/04/2016  . Genetic testing 01/21/2015  . Breast cancer ([place])   . Family history of ovarian cancer   . Family history of breast cancer     Past Surgical History:  Procedure Laterality Date  . BREAST EXCISIONAL BIOPSY Right   . SALPINGOOPHORECTOMY     preventative, due to [place] of ovarian cancer     OB History   No obstetric history on file.     Family History  Problem Relation Age of Onset  . Breast cancer Mother 9    . Hypertension Mother   . Heart attack Father   . Ovarian cancer Sister 44  . Breast cancer Paternal Aunt        dx in her 60s  . Ovarian cancer Maternal Grandmother        died at 26  . Breast cancer Maternal Grandmother   . Melanoma Daughter 32       on leg  . Breast cancer Cousin        dx in her 65s  . Breast cancer Maternal Aunt 25  . Heart attack Maternal Uncle   . Bone cancer Paternal Uncle   . Heart attack Maternal Grandfather   . Heart attack Maternal Aunt   . Prostate cancer Cousin 57    Social History   Tobacco Use  . Smoking status: Never Smoker  . Smokeless tobacco: Never Used  Substance Use Topics  . Alcohol use: Yes  . Drug use: No    Home Medications Prior to Admission medications   Medication Sig Start Date End Date Taking? Authorizing Provider  apixaban (ELIQUIS) 5 MG TABS tablet TAKE 1 TABLET (5 MG TOTAL) BY MOUTH 2 (TWO) TIMES DAILY. 08/27/19   Skeet Latch, MD  hydrochlorothiazide (HYDRODIURIL) 25 MG tablet Take 1 tablet (25 mg total) by mouth daily. 04/17/19   Skeet Latch, MD  lisinopril (PRINIVIL,ZESTRIL) 10 MG tablet Take 10 mg  by mouth daily.  05/20/16   [provider]  metoprolol tartrate (LOPRESSOR) 25 MG tablet 1/2 TABLET AS NEEDED AT ONSET OF AFIB, OK TO TAKE ADDITIONAL 1/2 TAB IF HEART RATE STILL ELEVATED AFTER A COUPLE OF HOURS 12/31/17   Skeet Latch, MD  potassium chloride SA (K-DUR,KLOR-CON) 20 MEQ tablet Take 1 tablet (20 mEq total) by mouth 2 (two) times daily. 05/14/18   Skeet Latch, MD    Allergies    Penicillins  Review of Systems   Review of Systems  All other systems reviewed and are negative.   Physical Exam Updated Vital Signs BP (!) 153/75 (BP Location: Right Arm)   Pulse 67   Temp 97.9 F (36.6 C) (Oral)   Resp 16   Ht 1.626 m (5\' 4" )   Wt 56.7 kg   SpO2 100%   BMI 21.46 kg/m   Physical Exam Vitals and nursing note reviewed.  Constitutional:      General: She is not in acute  distress.    Appearance: She is well-developed.  HENT:     Head: Normocephalic.     Comments: Periorbital contusions, tenderness to palpation periorbital region    Right Ear: External ear normal.     Left Ear: External ear normal.  Eyes:     General: No scleral icterus.       Right eye: No discharge.        Left eye: No discharge.     Conjunctiva/sclera: Conjunctivae normal.  Neck:     Trachea: No tracheal deviation.  Cardiovascular:     Rate and Rhythm: Normal rate and regular rhythm.  Pulmonary:     Effort: Pulmonary effort is normal. No respiratory distress.     Breath sounds: Normal breath sounds. No stridor. No wheezing or rales.  Abdominal:     General: Bowel sounds are normal. There is no distension.     Palpations: Abdomen is soft.     Tenderness: There is no abdominal tenderness. There is no guarding or rebound.  Musculoskeletal:        General: No tenderness.     Cervical back: Neck supple.     Comments: Entire spine nontender, contusions and small abrasions to bilateral knees, tenderness palpation bilateral hands with small abrasions to the palms bruising noted right thumb, small scab at the cuticle region  Skin:    General: Skin is warm and dry.     Findings: No rash.  Neurological:     Mental Status: She is alert.     Cranial Nerves: No cranial nerve deficit (no facial droop, extraocular movements intact, no slurred speech).     Sensory: No sensory deficit.     Motor: No abnormal muscle tone or seizure activity.     Coordination: Coordination normal.     ED Results / Procedures / Treatments   Labs (all labs ordered are listed, but only abnormal results are displayed) Labs Reviewed - No data to display  EKG None  Radiology CT Head Wo Contrast  Result Date: 12/13/2019 CLINICAL DATA:  Fall, abrasions to forehead and nose. EXAM: CT HEAD WITHOUT CONTRAST CT MAXILLOFACIAL WITHOUT CONTRAST TECHNIQUE: Multidetector CT imaging of the head and maxillofacial  structures were performed using the standard protocol without intravenous contrast. Multiplanar CT image reconstructions of the maxillofacial structures were also generated. COMPARISON:  None. FINDINGS: CT HEAD FINDINGS Brain: Ventricles are normal in size and configuration. There is no mass, hemorrhage, edema or other evidence of acute parenchymal abnormality. No extra-axial  hemorrhage. Vascular: Chronic calcified atherosclerotic changes of the large vessels at the skull base. No unexpected hyperdense vessel. Skull: Normal. Negative for fracture or focal lesion. Other: Scalp edema overlying the LEFT lower frontal bone and upper nose. Punctate densities at the skin surface above the bridge of the nose are presumably small associated foreign bodies. No underlying fracture. CT MAXILLOFACIAL FINDINGS Osseous: Lower frontal bones are intact and normally aligned. No displaced nasal bone fracture. Osseous structures about the orbits are intact and normally aligned bilaterally. Bilateral zygomatic arches and pterygoid plates are intact. No mandible fracture or displacement seen. Walls of the maxillary sinuses are intact and normally aligned bilaterally. No obvious tooth dislodgement. Orbits: Negative. No traumatic or inflammatory finding. Sinuses: Clear. Soft tissues: Soft tissue edema overlying the lower LEFT frontal bone and upper nose. IMPRESSION: 1. No acute intracranial abnormality. No intracranial mass, hemorrhage or edema. No skull fracture. 2. Scalp edema overlying the LEFT lower frontal bone and upper nose. Punctate densities at the skin surface above the bridge of the nose are presumably small associated foreign bodies. No underlying fracture. 3. No facial bone fracture or dislocation. Electronically Signed   By: Franki Cabot M.D.   On: 12/13/2019 14:06   CT Cervical Spine Wo Contrast  Result Date: 12/13/2019 CLINICAL DATA:  Trauma EXAM: CT CERVICAL SPINE WITHOUT CONTRAST TECHNIQUE: Multidetector CT imaging  of the cervical spine was performed without intravenous contrast. Multiplanar CT image reconstructions were also generated. COMPARISON:  None. FINDINGS: Alignment: No subluxation Skull base and vertebrae: No acute fracture. No primary bone lesion or focal pathologic process. Soft tissues and spinal canal: No prevertebral fluid or swelling. No visible canal hematoma. 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 Advanced bilateral diffuse degenerative facet disease. Upper chest: No acute findings Other: None IMPRESSION: Advanced degenerative disc and facet disease. No acute bony abnormality. Electronically Signed   By: Rolm Baptise M.D.   On: 12/13/2019 15:57   DG Knee Complete 4 Views Left  Result Date: 12/13/2019 CLINICAL DATA:  Fall with knee pain EXAM: LEFT KNEE - COMPLETE 4+ VIEW COMPARISON:  None. FINDINGS: No evidence of fracture, dislocation, or joint effusion. Chondrocalcinosis. IMPRESSION: Negative for fracture. Electronically Signed   By: Monte Fantasia M.D.   On: 12/13/2019 14:40   DG Knee Complete 4 Views Right  Result Date: 12/13/2019 CLINICAL DATA:  Fall with knee injury. EXAM: RIGHT KNEE - COMPLETE 4+ VIEW COMPARISON:  None. FINDINGS: No evidence of fracture, dislocation, or joint effusion. 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 Minor degenerative marginal spurring. IMPRESSION: Negative for fracture. Electronically Signed   By: Monte Fantasia M.D.   On: 12/13/2019 14:41   DG Hand Complete Left  Result Date: 12/13/2019 CLINICAL DATA:  Fall with hand pain and scrapes. EXAM: LEFT HAND - COMPLETE 3+ VIEW COMPARISON:  None. FINDINGS: No acute fracture or subluxation. 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 Generalized osteopenia. No opaque foreign body IMPRESSION: No acute osseous finding. Electronically Signed   By: Monte Fantasia M.D.   On: 12/13/2019 14:42   DG Hand Complete Right  Result Date: 12/13/2019 CLINICAL DATA:  Fall, pain EXAM:  RIGHT HAND - COMPLETE 3+ VIEW COMPARISON:  None. FINDINGS: Nondisplaced, extra-articular transverse fracture of the base of the left thumb distal phalanx. Moderate osteoarthritic pattern arthrosis throughout. Soft tissue edema about thumb. IMPRESSION: Nondisplaced, extra-articular transverse fracture of the base of the left thumb distal phalanx. Electronically Signed   By: Eddie Candle M.D.   On: 12/13/2019 14:42  CT Maxillofacial Wo Contrast  Result Date: 12/13/2019 CLINICAL DATA:  Fall, abrasions to forehead and nose. EXAM: CT HEAD WITHOUT CONTRAST CT MAXILLOFACIAL WITHOUT CONTRAST TECHNIQUE: Multidetector CT imaging of the head and maxillofacial structures were performed using the standard protocol without intravenous contrast. Multiplanar CT image reconstructions of the maxillofacial structures were also generated. COMPARISON:  None. FINDINGS: CT HEAD FINDINGS Brain: Ventricles are normal in size and configuration. There is no mass, hemorrhage, edema or other evidence of acute parenchymal abnormality. No extra-axial hemorrhage. Vascular: Chronic calcified atherosclerotic changes of the large vessels at the skull base. No unexpected hyperdense vessel. Skull: Normal. Negative for fracture or focal lesion. Other: Scalp edema overlying the LEFT lower frontal bone and upper nose. Punctate densities at the skin surface above the bridge of the nose are presumably small associated foreign bodies. No underlying fracture. CT MAXILLOFACIAL FINDINGS Osseous: Lower frontal bones are intact and normally aligned. No displaced nasal bone fracture. Osseous structures about the orbits are intact and normally aligned bilaterally. Bilateral zygomatic arches and pterygoid plates are intact. No mandible fracture or displacement seen. Walls of the maxillary sinuses are intact and normally aligned bilaterally. No obvious tooth dislodgement. Orbits: Negative. No traumatic or inflammatory finding. Sinuses: Clear. Soft tissues: Soft  tissue edema overlying the lower LEFT frontal bone and upper nose. IMPRESSION: 1. No acute intracranial abnormality. No intracranial mass, hemorrhage or edema. No skull fracture. 2. Scalp edema overlying the LEFT lower frontal bone and upper nose. Punctate densities at the skin surface above the bridge of the nose are presumably small associated foreign bodies. No underlying fracture. 3. No facial bone fracture or dislocation. Electronically Signed   By: Franki Cabot M.D.   On: 12/13/2019 14:06    Procedures Procedures (including critical care time)  Medications Ordered in ED Medications  bacitracin ointment 1 application (has no administration in time range)  Tdap (BOOSTRIX) injection 0.5 mL (0.5 mLs Intramuscular Given 12/13/19 1429)    ED Course  I have reviewed the triage vital signs and the nursing notes.  Pertinent labs & imaging results that were available during my care of the patient were reviewed by me and considered in my medical decision making (see chart for details).  Clinical Course as of Dec 13 1623  Sun Dec 13, 2019  1514 Patient is complaining of more pins-and-needles sensation in her hand right now.  She is not complaining of any neck pain.  She could be having some cervical radiculopathy.  I will order a CT scan of her neck to rule out any cervical spine injury   [JK]  1621 CT scan of C-spine does not show any acute fracture.   [JK]    Clinical Course User Index [JK] Dorie Rank, MD   MDM Rules/Calculators/A&P                     Patient presented to ED for evaluation after a fall that occurred yesterday.  Patient is on Eliquis but fortunately was no evidence of facial bone fracture or intracranial bleed.  Her x-rays do show a thumb fracture on the right hand that we will splint.  Discussed the findings of possible punctate debris noted on the CT scan.  Patient will plan on bathing and cleaning her abrasions.  Patient has complained of some pins and needle sensation in  her hand.  She has good strength and normal sensation.  There is the possibility of cervical spine injury causing some of these paresthesias but with  her having normal function, normal CAT scan normal sensation I do not think she needs an MRI right now.  Discussed warning signs and precautions with both the patient and her daughter.  Final Clinical Impression(s) / ED Diagnoses Final diagnoses:  Injury of head, initial encounter  Contusion of face, initial encounter  Closed nondisplaced fracture of distal phalanx of right thumb, initial encounter    Rx / DC Orders ED Discharge Orders    None       Dorie Rank, MD 12/13/19 1625

## 2019-12-13 NOTE — ED Notes (Signed)
Evaluated pt in waiting room. Ecchymosis noted to right eye orbit; laceration to bridge of nose with ecchymosis to nose, abrasion/ecchymosis to forehead. Pt states she fell and struck her head earlier. Denies LOC, but is on anti-coagulant therapy.  This RN spoke with Augusto Gamble, NP regarding pt complaint, injury, and medication. Advised that pt should go to ER stat for CT and higher level eval/care. RN notified pt and spouse of medical advice to go to ER for CT. Pt verbalized understanding and agreement.

## 2019-12-13 NOTE — ED Notes (Signed)
Patient is resting comfortably in her room.

## 2019-12-17 DIAGNOSIS — S62524A Nondisplaced fracture of distal phalanx of right thumb, initial encounter for closed fracture: Secondary | ICD-10-CM | POA: Diagnosis not present

## 2019-12-28 ENCOUNTER — Telehealth: Payer: Self-pay | Admitting: Cardiovascular Disease

## 2019-12-28 DIAGNOSIS — M79651 Pain in right thigh: Secondary | ICD-10-CM | POA: Diagnosis not present

## 2019-12-28 NOTE — Telephone Encounter (Signed)
New Message  Patient is wanting to speak with Dr. Oval Linsey or her nurse. States that on 12/12/19 she had a bad fall that resulted in her being in pain and was seen in ER on 12/13/19. States that after a 6 hour long car ride yesterday that she is having pain in her right knee and right above the knee. Patient is concerned with it being a blood clot. Please give patient a call back to discuss.

## 2019-12-28 NOTE — Telephone Encounter (Signed)
Returned call to patient of Dr. Oval Linsey. She fell on 4/3 and went to ED on 4/4. Yesterday she had a long car ride - she reports her right knee/leg is swollen, there is some redness, no color change, tender to touch. She describes pain in right leg, feels like deep pain, like a pulled muscle - hurts when bending leg, stretching. Denies SOB. She is concerned about a DVT despite being on Eliquis.   Will route to MD to review anda dvise

## 2019-12-30 DIAGNOSIS — D225 Melanocytic nevi of trunk: Secondary | ICD-10-CM | POA: Diagnosis not present

## 2019-12-30 DIAGNOSIS — D2262 Melanocytic nevi of left upper limb, including shoulder: Secondary | ICD-10-CM | POA: Diagnosis not present

## 2019-12-30 DIAGNOSIS — L821 Other seborrheic keratosis: Secondary | ICD-10-CM | POA: Diagnosis not present

## 2019-12-30 DIAGNOSIS — D2261 Melanocytic nevi of right upper limb, including shoulder: Secondary | ICD-10-CM | POA: Diagnosis not present

## 2019-12-30 DIAGNOSIS — D2271 Melanocytic nevi of right lower limb, including hip: Secondary | ICD-10-CM | POA: Diagnosis not present

## 2019-12-30 DIAGNOSIS — L820 Inflamed seborrheic keratosis: Secondary | ICD-10-CM | POA: Diagnosis not present

## 2019-12-30 DIAGNOSIS — D1801 Hemangioma of skin and subcutaneous tissue: Secondary | ICD-10-CM | POA: Diagnosis not present

## 2019-12-30 DIAGNOSIS — Z85828 Personal history of other malignant neoplasm of skin: Secondary | ICD-10-CM | POA: Diagnosis not present

## 2019-12-30 DIAGNOSIS — D2272 Melanocytic nevi of left lower limb, including hip: Secondary | ICD-10-CM | POA: Diagnosis not present

## 2019-12-30 DIAGNOSIS — L720 Epidermal cyst: Secondary | ICD-10-CM | POA: Diagnosis not present

## 2019-12-30 NOTE — Telephone Encounter (Signed)
No further recommendations.

## 2019-12-30 NOTE — Telephone Encounter (Signed)
This is extremely unlikely given that she is on Eliquis.  It is more likely due to trauma from her fall.  She is to follow-up with her PCP.

## 2019-12-30 NOTE — Telephone Encounter (Signed)
Advised patient, verbalized understanding She has seen PCP  Patient has had 3 falls in the last 3 weeks, all of which were brought on by tripping Per patient only one of the falls was bad, the other 2 she had hold of her husband.  Patient denies being dizzy/lighteaded, loss of consciousness or any preceding symptoms.  Stated she has never had issues with tripping like this before, suggested she follow up with PCP Will route to Dr Oval Linsey as Juluis Rainier Advised patient would only call back if Dr Oval Linsey had any further recommendations

## 2020-01-07 DIAGNOSIS — S62524D Nondisplaced fracture of distal phalanx of right thumb, subsequent encounter for fracture with routine healing: Secondary | ICD-10-CM | POA: Diagnosis not present

## 2020-02-04 DIAGNOSIS — S62524D Nondisplaced fracture of distal phalanx of right thumb, subsequent encounter for fracture with routine healing: Secondary | ICD-10-CM | POA: Diagnosis not present

## 2020-03-29 ENCOUNTER — Emergency Department (HOSPITAL_COMMUNITY): Payer: Medicare Other

## 2020-03-29 ENCOUNTER — Inpatient Hospital Stay (HOSPITAL_COMMUNITY)
Admission: EM | Admit: 2020-03-29 | Discharge: 2020-04-01 | DRG: 552 | Disposition: A | Discharge status: Home / Self Care | Payer: Medicare Other | Attending: Neurosurgery | Admitting: Neurosurgery

## 2020-03-29 ENCOUNTER — Encounter (HOSPITAL_COMMUNITY): Payer: Self-pay

## 2020-03-29 ENCOUNTER — Other Ambulatory Visit: Payer: Self-pay

## 2020-03-29 DIAGNOSIS — M47812 Spondylosis without myelopathy or radiculopathy, cervical region: Secondary | ICD-10-CM | POA: Diagnosis present

## 2020-03-29 DIAGNOSIS — I272 Pulmonary hypertension, unspecified: Secondary | ICD-10-CM | POA: Diagnosis present

## 2020-03-29 DIAGNOSIS — Y92002 Bathroom of unspecified non-institutional (private) residence single-family (private) house as the place of occurrence of the external cause: Secondary | ICD-10-CM | POA: Diagnosis not present

## 2020-03-29 DIAGNOSIS — I082 Rheumatic disorders of both aortic and tricuspid valves: Secondary | ICD-10-CM | POA: Diagnosis present

## 2020-03-29 DIAGNOSIS — S0003XA Contusion of scalp, initial encounter: Secondary | ICD-10-CM | POA: Diagnosis present

## 2020-03-29 DIAGNOSIS — S12112A Nondisplaced Type II dens fracture, initial encounter for closed fracture: Secondary | ICD-10-CM | POA: Diagnosis not present

## 2020-03-29 DIAGNOSIS — M4723 Other spondylosis with radiculopathy, cervicothoracic region: Secondary | ICD-10-CM | POA: Diagnosis not present

## 2020-03-29 DIAGNOSIS — S14154A Other incomplete lesion at C4 level of cervical spinal cord, initial encounter: Secondary | ICD-10-CM | POA: Diagnosis not present

## 2020-03-29 DIAGNOSIS — Z20822 Contact with and (suspected) exposure to covid-19: Secondary | ICD-10-CM | POA: Diagnosis present

## 2020-03-29 DIAGNOSIS — Z8249 Family history of ischemic heart disease and other diseases of the circulatory system: Secondary | ICD-10-CM | POA: Diagnosis not present

## 2020-03-29 DIAGNOSIS — Z880 Allergy status to penicillin: Secondary | ICD-10-CM | POA: Diagnosis not present

## 2020-03-29 DIAGNOSIS — R001 Bradycardia, unspecified: Secondary | ICD-10-CM | POA: Diagnosis present

## 2020-03-29 DIAGNOSIS — Z803 Family history of malignant neoplasm of breast: Secondary | ICD-10-CM

## 2020-03-29 DIAGNOSIS — R079 Chest pain, unspecified: Secondary | ICD-10-CM | POA: Diagnosis not present

## 2020-03-29 DIAGNOSIS — R208 Other disturbances of skin sensation: Secondary | ICD-10-CM | POA: Diagnosis present

## 2020-03-29 DIAGNOSIS — I5032 Chronic diastolic (congestive) heart failure: Secondary | ICD-10-CM | POA: Diagnosis present

## 2020-03-29 DIAGNOSIS — Z8041 Family history of malignant neoplasm of ovary: Secondary | ICD-10-CM

## 2020-03-29 DIAGNOSIS — Z853 Personal history of malignant neoplasm of breast: Secondary | ICD-10-CM | POA: Diagnosis not present

## 2020-03-29 DIAGNOSIS — Z79899 Other long term (current) drug therapy: Secondary | ICD-10-CM

## 2020-03-29 DIAGNOSIS — W1839XA Other fall on same level, initial encounter: Secondary | ICD-10-CM | POA: Diagnosis present

## 2020-03-29 DIAGNOSIS — S12100A Unspecified displaced fracture of second cervical vertebra, initial encounter for closed fracture: Secondary | ICD-10-CM | POA: Diagnosis present

## 2020-03-29 DIAGNOSIS — I10 Essential (primary) hypertension: Secondary | ICD-10-CM | POA: Diagnosis not present

## 2020-03-29 DIAGNOSIS — Z7901 Long term (current) use of anticoagulants: Secondary | ICD-10-CM

## 2020-03-29 DIAGNOSIS — I480 Paroxysmal atrial fibrillation: Secondary | ICD-10-CM

## 2020-03-29 DIAGNOSIS — M5011 Cervical disc disorder with radiculopathy,  high cervical region: Secondary | ICD-10-CM | POA: Diagnosis not present

## 2020-03-29 DIAGNOSIS — S12101A Unspecified nondisplaced fracture of second cervical vertebra, initial encounter for closed fracture: Secondary | ICD-10-CM | POA: Diagnosis not present

## 2020-03-29 DIAGNOSIS — M4802 Spinal stenosis, cervical region: Secondary | ICD-10-CM | POA: Diagnosis present

## 2020-03-29 DIAGNOSIS — R55 Syncope and collapse: Secondary | ICD-10-CM | POA: Diagnosis present

## 2020-03-29 DIAGNOSIS — I110 Hypertensive heart disease with heart failure: Secondary | ICD-10-CM | POA: Diagnosis present

## 2020-03-29 DIAGNOSIS — S0993XA Unspecified injury of face, initial encounter: Secondary | ICD-10-CM | POA: Diagnosis not present

## 2020-03-29 DIAGNOSIS — M542 Cervicalgia: Secondary | ICD-10-CM | POA: Diagnosis not present

## 2020-03-29 DIAGNOSIS — I351 Nonrheumatic aortic (valve) insufficiency: Secondary | ICD-10-CM | POA: Diagnosis not present

## 2020-03-29 DIAGNOSIS — M4712 Other spondylosis with myelopathy, cervical region: Secondary | ICD-10-CM | POA: Diagnosis not present

## 2020-03-29 DIAGNOSIS — I361 Nonrheumatic tricuspid (valve) insufficiency: Secondary | ICD-10-CM | POA: Diagnosis not present

## 2020-03-29 DIAGNOSIS — W19XXXA Unspecified fall, initial encounter: Principal | ICD-10-CM

## 2020-03-29 DIAGNOSIS — M50322 Other cervical disc degeneration at C5-C6 level: Secondary | ICD-10-CM | POA: Diagnosis not present

## 2020-03-29 LAB — CBC WITH DIFFERENTIAL/PLATELET
Abs Immature Granulocytes: 0.03 10*3/uL (ref 0.00–0.07)
Basophils Absolute: 0 10*3/uL (ref 0.0–0.1)
Basophils Relative: 0 %
Eosinophils Absolute: 0.1 10*3/uL (ref 0.0–0.5)
Eosinophils Relative: 2 %
HCT: 43.2 % (ref 36.0–46.0)
Hemoglobin: 14.9 g/dL (ref 12.0–15.0)
Immature Granulocytes: 0 %
Lymphocytes Relative: 41 %
Lymphs Abs: 3.3 10*3/uL (ref 0.7–4.0)
MCH: 31 pg (ref 26.0–34.0)
MCHC: 34.5 g/dL (ref 30.0–36.0)
MCV: 90 fL (ref 80.0–100.0)
Monocytes Absolute: 0.6 10*3/uL (ref 0.1–1.0)
Monocytes Relative: 8 %
Neutro Abs: 3.9 10*3/uL (ref 1.7–7.7)
Neutrophils Relative %: 49 %
Platelets: 239 10*3/uL (ref 150–400)
RBC: 4.8 MIL/uL (ref 3.87–5.11)
RDW: 13.2 % (ref 11.5–15.5)
WBC: 8.1 10*3/uL (ref 4.0–10.5)
nRBC: 0 % (ref 0.0–0.2)

## 2020-03-29 LAB — COMPREHENSIVE METABOLIC PANEL
ALT: 20 U/L (ref 0–44)
AST: 22 U/L (ref 15–41)
Albumin: 4.2 g/dL (ref 3.5–5.0)
Alkaline Phosphatase: 61 U/L (ref 38–126)
Anion gap: 13 (ref 5–15)
BUN: 16 mg/dL (ref 8–23)
CO2: 23 mmol/L (ref 22–32)
Calcium: 11.1 mg/dL — ABNORMAL HIGH (ref 8.9–10.3)
Chloride: 104 mmol/L (ref 98–111)
Creatinine, Ser: 0.88 mg/dL (ref 0.44–1.00)
GFR calc Af Amer: >60 mL/min (ref >60)
GFR calc non Af Amer: >60 mL/min (ref >60)
Glucose, Bld: 158 mg/dL — ABNORMAL HIGH (ref 70–99)
Potassium: 3.6 mmol/L (ref 3.5–5.1)
Sodium: 140 mmol/L (ref 135–145)
Total Bilirubin: 1 mg/dL (ref 0.3–1.2)
Total Protein: 6.7 g/dL (ref 6.5–8.1)

## 2020-03-29 LAB — MAGNESIUM: Magnesium: 1.6 mg/dL — ABNORMAL LOW (ref 1.7–2.4)

## 2020-03-29 LAB — SARS CORONAVIRUS 2 BY RT PCR (HOSPITAL ORDER, PERFORMED IN ~~LOC~~ HOSPITAL LAB): SARS Coronavirus 2: NEGATIVE

## 2020-03-29 MED ORDER — METOPROLOL TARTRATE 12.5 MG HALF TABLET: 12.5000 mg | ORAL_TABLET | Freq: Two times a day (BID) | ORAL | Status: DC | PRN

## 2020-03-29 MED ORDER — OXYCODONE HCL 5 MG PO TABS
5.0000 mg | ORAL_TABLET | ORAL | Status: DC | PRN
  Administered 2020-03-29 – 2020-03-30 (×2): 5 mg via ORAL
  Filled 2020-03-29 (×2): qty 1

## 2020-03-29 MED ORDER — ONDANSETRON HCL 4 MG/2ML IJ SOLN: 4.0000 mg | Freq: Four times a day (QID) | INTRAMUSCULAR | Status: DC | PRN

## 2020-03-29 MED ORDER — HYDROCHLOROTHIAZIDE 25 MG PO TABS
25.0000 mg | ORAL_TABLET | Freq: Every day | ORAL | Status: DC
  Administered 2020-03-29 – 2020-04-01 (×4): 25 mg via ORAL
  Filled 2020-03-29 (×4): qty 1

## 2020-03-29 MED ORDER — BISACODYL 10 MG RE SUPP: 10.0000 mg | Freq: Every day | RECTAL | Status: DC | PRN

## 2020-03-29 MED ORDER — ACETAMINOPHEN 650 MG RE SUPP: 650.0000 mg | Freq: Four times a day (QID) | RECTAL | Status: DC | PRN

## 2020-03-29 MED ORDER — ONDANSETRON HCL 4 MG PO TABS: 4.0000 mg | ORAL_TABLET | Freq: Four times a day (QID) | ORAL | Status: DC | PRN

## 2020-03-29 MED ORDER — MAGNESIUM SULFATE 2 GM/50ML IV SOLN
2.0000 g | Freq: Once | INTRAVENOUS | Status: AC
  Administered 2020-03-29: 2 g via INTRAVENOUS
  Filled 2020-03-29: qty 50

## 2020-03-29 MED ORDER — LISINOPRIL 10 MG PO TABS
10.0000 mg | ORAL_TABLET | Freq: Every day | ORAL | Status: DC
  Administered 2020-03-29 – 2020-04-01 (×4): 10 mg via ORAL
  Filled 2020-03-29 (×4): qty 1

## 2020-03-29 MED ORDER — POLYETHYLENE GLYCOL 3350 17 G PO PACK: 17.0000 g | PACK | Freq: Every day | ORAL | Status: DC | PRN

## 2020-03-29 MED ORDER — ACETAMINOPHEN 325 MG PO TABS
650.0000 mg | ORAL_TABLET | Freq: Four times a day (QID) | ORAL | Status: DC | PRN
  Administered 2020-03-30 – 2020-04-01 (×6): 650 mg via ORAL
  Filled 2020-03-29 (×6): qty 2

## 2020-03-29 MED ORDER — POTASSIUM CHLORIDE CRYS ER 20 MEQ PO TBCR
20.0000 meq | EXTENDED_RELEASE_TABLET | Freq: Two times a day (BID) | ORAL | Status: DC
  Administered 2020-03-29 – 2020-04-01 (×7): 20 meq via ORAL
  Filled 2020-03-29 (×7): qty 1

## 2020-03-29 MED ORDER — FENTANYL CITRATE (PF) 100 MCG/2ML IJ SOLN
INTRAMUSCULAR | Status: AC
  Filled 2020-03-29: qty 2

## 2020-03-29 MED ORDER — FENTANYL CITRATE (PF) 100 MCG/2ML IJ SOLN
50.0000 ug | Freq: Once | INTRAMUSCULAR | Status: AC
  Administered 2020-03-29: 50 ug via INTRAVENOUS

## 2020-03-29 MED ORDER — GABAPENTIN 300 MG PO CAPS
300.0000 mg | ORAL_CAPSULE | Freq: Three times a day (TID) | ORAL | Status: DC
  Administered 2020-03-29 – 2020-04-01 (×10): 300 mg via ORAL
  Filled 2020-03-29 (×10): qty 1

## 2020-03-29 MED ORDER — DOCUSATE SODIUM 100 MG PO CAPS
100.0000 mg | ORAL_CAPSULE | Freq: Two times a day (BID) | ORAL | Status: DC
  Administered 2020-03-29 – 2020-04-01 (×4): 100 mg via ORAL
  Filled 2020-03-29 (×6): qty 1

## 2020-03-29 MED ORDER — DEXAMETHASONE SODIUM PHOSPHATE 10 MG/ML IJ SOLN
10.0000 mg | Freq: Four times a day (QID) | INTRAMUSCULAR | Status: DC
  Administered 2020-03-29 – 2020-04-01 (×13): 10 mg via INTRAVENOUS
  Filled 2020-03-29 (×13): qty 1

## 2020-03-29 MED ORDER — HYDROMORPHONE HCL 1 MG/ML IJ SOLN: 0.5000 mg | INTRAMUSCULAR | Status: DC | PRN

## 2020-03-29 NOTE — H&P (Signed)
Ashlee Flores is an 79 y.o. female.   Chief Complaint: Neck pain HPI: Ashlee Flores is a pleasant 79 year old woman who presented to the emergency department this morning following a syncopal event. She struck her face on the bathtub and when she regained consciousness, she had pain her face and neck. She reported pain and weakness in her arms following the fall. Her husband brought her to the ED, where CT scan and MRI were performed. She presently complains of severe "burning and stabbing" pain into bilateral upper extremities. She reports significant generalized dysesthesia in bilateral arms and hands. She denies pain in any other areas. She denies nausea, vomiting, or changes in bowel or bladder function.  Past Medical History:  Diagnosis Date  . 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 Diastolic dysfunction 09/15/1094  . Essential hypertension 06/04/2016  . Family history of breast cancer   . Family history of ovarian cancer   . Palpitations 06/04/2016  . Paroxysmal atrial fibrillation ([place]) 08/22/2016    Past Surgical History:  Procedure Laterality Date  . BREAST EXCISIONAL BIOPSY Right   . SALPINGOOPHORECTOMY     preventative, due to [place] of ovarian cancer    Family History  Problem Relation Age of Onset  . Breast cancer Mother 31  . Hypertension Mother   . Heart attack Father   . Ovarian cancer Sister 34  . Breast cancer Paternal Aunt        dx in her 60s  . Ovarian cancer Maternal Grandmother        died at 18  . Breast cancer Maternal Grandmother   . Melanoma Daughter 32       on leg  . Breast cancer Cousin        dx in her 25s  . Breast cancer Maternal Aunt 81  . Heart attack Maternal Uncle   . Bone cancer Paternal Uncle   . Heart attack Maternal Grandfather   . Heart attack Maternal Aunt   . Prostate cancer Cousin 72   Social History:  reports that she has never smoked. She has never used smokeless tobacco. She reports current alcohol use. She reports that  she does not use drugs.  Allergies:  Allergies  Allergen Reactions  . Penicillins Anaphylaxis    (Not in a hospital admission)   Results for orders placed or performed during the hospital encounter of 03/29/20 (from the past 48 hour(s))  CBC with Differential     Status: None   Collection Time: 03/29/20  5:59 AM  Result Value Ref Range   WBC 8.1 4.0 - 10.5 K/uL   RBC 4.80 3.87 - 5.11 MIL/uL   Hemoglobin 14.9 12.0 - 15.0 g/dL   HCT 43.2 36 - 46 %   MCV 90.0 80.0 - 100.0 fL   MCH 31.0 26.0 - 34.0 pg   MCHC 34.5 30.0 - 36.0 g/dL   RDW 13.2 11.5 - 15.5 %   Platelets 239 150 - 400 K/uL   nRBC 0.0 0.0 - 0.2 %   Neutrophils Relative % 49 %   Neutro Abs 3.9 1.7 - 7.7 K/uL   Lymphocytes Relative 41 %   Lymphs Abs 3.3 0.7 - 4.0 K/uL   Monocytes Relative 8 %   Monocytes Absolute 0.6 0 - 1 K/uL   Eosinophils Relative 2 %   Eosinophils Absolute 0.1 0 - 0 K/uL   Basophils Relative 0 %   Basophils Absolute 0.0 0 - 0 K/uL  Immature Granulocytes 0 %   Abs Immature Granulocytes 0.03 0.00 - 0.07 K/uL    Comment: Performed at [facility], [place] [address], [place], [place] [address]  Comprehensive metabolic panel     Status: Abnormal   Collection Time: 03/29/20  5:59 AM  Result Value Ref Range   Sodium 140 135 - 145 mmol/L   Potassium 3.6 3.5 - 5.1 mmol/L   Chloride 104 98 - 111 mmol/L   CO2 23 22 - 32 mmol/L   Glucose, Bld 158 (H) 70 - 99 mg/dL    Comment: Glucose reference range applies only to samples taken after fasting for at least 8 hours.   BUN 16 8 - 23 mg/dL   Creatinine, Ser 0.88 0.44 - 1.00 mg/dL   Calcium 11.1 (H) 8.9 - 10.3 mg/dL   Total Protein 6.7 6.5 - 8.1 g/dL   Albumin 4.2 3.5 - 5.0 g/dL   AST 22 15 - 41 U/L   ALT 20 0 - 44 U/L   Alkaline Phosphatase 61 38 - 126 U/L   Total Bilirubin 1.0 0.3 - 1.2 mg/dL   GFR calc non Af Amer >60 >60 mL/min   GFR calc Af Amer >60 >60 mL/min   Anion gap 13 5 - 15    Comment: Performed at [facility], [place]  [address] [place], [place] [address]  Magnesium     Status: Abnormal   Collection Time: 03/29/20  5:59 AM  Result Value Ref Range   Magnesium 1.6 (L) 1.7 - 2.4 mg/dL    Comment: Performed at [facility] [address], [place], [place] [address]   CT Head Wo Contrast  Result Date: 03/29/2020 CLINICAL DATA:  [place]. Hit head. EXAM: CT HEAD WITHOUT CONTRAST CT MAXILLOFACIAL WITHOUT CONTRAST CT CERVICAL SPINE WITHOUT CONTRAST TECHNIQUE: Multidetector CT imaging of the head, cervical spine, and maxillofacial structures were performed using the standard protocol without intravenous contrast. Multiplanar CT image reconstructions of the cervical spine and maxillofacial structures were also generated. COMPARISON:  CT head and face 12/13/2019 is FINDINGS: CT HEAD FINDINGS Brain: The ventricles are in the midline without mass effect or shift. They are normal in size and configuration for age and degree of cerebral atrophy. No extra-axial fluid collections are identified. The gray-white differentiation is maintained. No CT findings for acute hemispheric infarction or intracranial hemorrhage. No mass lesions. The brainstem and cerebellum are unremarkable and stable. Vascular: Vascular calcifications but no aneurysm or hyperdense vessels. Skull: No skull fracture or bone lesions. Other: Right frontal scalp hematoma is noted but no radiopaque foreign body or underlying fracture. CT MAXILLOFACIAL FINDINGS Osseous: No acute facial bone fractures are identified. The mandibular condyles are normally located. No mandible fracture. Orbits: No orbital fractures. The globes are intact. No intraorbital hematoma. Sinuses: The paranasal sinuses and mastoid air cells are clear. Soft tissues: Right frontal scalp hematoma. CT CERVICAL SPINE FINDINGS Alignment: Degenerative cervical spondylosis with multilevel disc disease and facet disease. Mild degenerative subluxations but the overall alignment is normal. Skull base and vertebrae:  The skull base C1 and C1-2 articulations are maintained. Moderate degenerative changes. Some calcified pannus is noted at C1-2 but no significant mass effect on the upper cervical cord. There is a nondisplaced fracture through the anterior base of the dens. This is likely a flexion teardrop type fracture. No other fractures are identified. The facets are intact. No widening of the posterior disc space. No significant prevertebral hematoma is identified. Severe degenerative disc  disease at C4-5, C5-6 and C6-7. Associated discogenic sclerosis and posterior spurring. Soft tissues and spinal canal: No prevertebral fluid or swelling. No visible canal hematoma. 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 Mild to moderate spinal and bilateral foraminal stenosis at C4-5, C5-6 and C6-7. Upper chest: The lung apices are grossly clear. Other: No neck mass or adenopathy. The thyroid gland is grossly normal. IMPRESSION: 1. No acute intracranial findings or skull fracture. 2. Right frontal scalp hematoma but no radiopaque foreign body or underlying fracture. 3. Nondisplaced fracture through the anterior base of the dens, likely a flexion teardrop type fracture. No other cervical spine fractures are identified. 4. 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 There is mild to moderate spinal and bilateral foraminal stenosis at C4-5, C5-6 and C6-7. 5. No acute facial bone fractures are identified. Electronically Signed   By: Marijo Sanes M.D.   On: 03/29/2020 07:00   CT Cervical Spine Wo Contrast  Result Date: 03/29/2020 CLINICAL DATA:  [place]. Hit head. EXAM: CT HEAD WITHOUT CONTRAST CT MAXILLOFACIAL WITHOUT CONTRAST CT CERVICAL SPINE WITHOUT CONTRAST TECHNIQUE: Multidetector CT imaging of the head, cervical spine, and maxillofacial structures were performed using the standard protocol  without intravenous contrast. Multiplanar CT image reconstructions of the cervical spine and maxillofacial structures were also generated. COMPARISON:  CT head and face 12/13/2019 is FINDINGS: CT HEAD FINDINGS Brain: The ventricles are in the midline without mass effect or shift. They are normal in size and configuration for age and degree of cerebral atrophy. No extra-axial fluid collections are identified. The gray-white differentiation is maintained. No CT findings for acute hemispheric infarction or intracranial hemorrhage. No mass lesions. The brainstem and cerebellum are unremarkable and stable. Vascular: Vascular calcifications but no aneurysm or hyperdense vessels. Skull: No skull fracture or bone lesions. Other: Right frontal scalp hematoma is noted but no radiopaque foreign body or underlying fracture. CT MAXILLOFACIAL FINDINGS Osseous: No acute facial bone fractures are identified. The mandibular condyles are normally located. No mandible fracture. Orbits: No orbital fractures. The globes are intact. No intraorbital hematoma. Sinuses: The paranasal sinuses and mastoid air cells are clear. Soft tissues: Right frontal scalp hematoma. CT CERVICAL SPINE FINDINGS Alignment: Degenerative cervical spondylosis with multilevel disc disease and facet disease. Mild degenerative subluxations but the overall alignment is normal. Skull base and vertebrae: The skull base C1 and C1-2 articulations are maintained. Moderate degenerative changes. Some calcified pannus is noted at C1-2 but no significant mass effect on the upper cervical cord. There is a nondisplaced fracture through the anterior base of the dens. This is likely a flexion teardrop type fracture. No other fractures are identified. The facets are intact. No widening of the posterior disc space. No significant prevertebral hematoma is identified. Severe degenerative disc disease at C4-5, C5-6 and C6-7. Associated discogenic sclerosis and posterior spurring.  Soft tissues and spinal canal: No prevertebral fluid or swelling. No visible canal hematoma. 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 Mild to moderate spinal and bilateral foraminal stenosis at C4-5, C5-6 and C6-7. Upper chest: The lung apices are grossly clear. Other: No neck mass or adenopathy. The thyroid gland is grossly normal. IMPRESSION: 1. No acute intracranial findings or skull fracture. 2. Right frontal scalp hematoma but no radiopaque foreign body or underlying fracture. 3. Nondisplaced fracture through the anterior base of  the dens, likely a flexion teardrop type fracture. No other cervical spine fractures are identified. 4. 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 There is mild to moderate spinal and bilateral foraminal stenosis at C4-5, C5-6 and C6-7. 5. No acute facial bone fractures are identified. Electronically Signed   By: Marijo Sanes M.D.   On: 03/29/2020 07:00   MR Cervical Spine Wo Contrast  Result Date: 03/29/2020 CLINICAL DATA:  Fall, fracture EXAM: MRI CERVICAL SPINE WITHOUT CONTRAST TECHNIQUE: Multiplanar, multisequence MR imaging of the cervical spine was performed. No intravenous contrast was administered. COMPARISON:  Correlation made with prior CT FINDINGS: Alignment: There is mild retrolisthesis at C4-C5, C5-C6, C6-C7 on a degenerative basis. Vertebrae: Oblique nondisplaced fracture through the anterior inferior corner of C2 as seen on CT. Trace associated marrow edema. Marrow signal is mildly heterogeneous likely reflecting osteopenia degenerative endplate changes. Cord: No abnormal signal. Posterior Fossa, vertebral arteries, paraspinal tissues: There is minimal prevertebral fluid at the C4 and C5 levels. Otherwise unremarkable. Disc levels: C2-C3:  Facet hypertrophy.  No significant canal foraminal stenosis. C3-C4: Disc bulge  slightly eccentric to right and facet uncovertebral hypertrophy. Mild canal stenosis. Minor right foraminal stenosis. No significant left foraminal stenosis C4-C5: Disc bulge with calcification, endplate osteophytes, and facet and uncovertebral hypertrophy. Moderate canal stenosis. Moderate to marked right and marked left foraminal stenosis. C5-C6: Disc bulge with calcification, endplate osteophytes, and facet and uncovertebral hypertrophy. Marked canal stenosis. Marked foraminal stenosis. C6-C7: Disc bulge with calcification, endplate osteophytes, and facet and uncovertebral hypertrophy. Mild to moderate canal stenosis. Moderate, left greater than right foraminal stenosis. C7-T1: Facet hypertrophy. No significant canal or foraminal stenosis. IMPRESSION: Oblique nondisplaced fracture through the anterior inferior corner of C2 as seen on CT. Minimal prevertebral fluid is present at C4 and C5 levels. No evidence of ligament disruption. Multilevel degenerative changes as detailed above. There is marked canal stenosis at C5-C6. Multilevel foraminal stenosis, greatest from C4-C5 through C6-C7. Electronically Signed   By: Macy Mis M.D.   On: 03/29/2020 09:02   DG Chest Portable 1 View  Result Date: 03/29/2020 CLINICAL DATA:  [place]. Chest pain. EXAM: PORTABLE CHEST 1 VIEW COMPARISON:  None. FINDINGS: The cardiac silhouette, mediastinal and hilar contours are within normal limits given the AP projection, portable technique and supine position of the patient. The lungs are clear. No pleural effusion or pneumothorax. No worrisome pulmonary lesions. The bony thorax is intact. IMPRESSION: No acute cardiopulmonary findings. Electronically Signed   By: Marijo Sanes M.D.   On: 03/29/2020 06:49   CT Maxillofacial Wo Contrast  Result Date: 03/29/2020 CLINICAL DATA:  [place]. Hit head. EXAM: CT HEAD WITHOUT CONTRAST CT MAXILLOFACIAL WITHOUT CONTRAST CT CERVICAL SPINE WITHOUT CONTRAST TECHNIQUE: Multidetector CT imaging of  the head, cervical spine, and maxillofacial structures were performed using the standard protocol without intravenous contrast. Multiplanar CT image reconstructions of the cervical spine and maxillofacial structures were also generated. COMPARISON:  CT head and face 12/13/2019 is FINDINGS: CT HEAD FINDINGS Brain: The ventricles are in the midline without mass effect or shift. They are normal in size and configuration for age and degree of cerebral atrophy. No extra-axial fluid collections are identified. The gray-white differentiation is maintained. No CT findings for acute hemispheric infarction or intracranial hemorrhage. No mass lesions. The brainstem and cerebellum are unremarkable and stable. Vascular: Vascular calcifications but no aneurysm or hyperdense vessels. Skull: No skull fracture or bone lesions. Other: Right frontal scalp hematoma is noted but no radiopaque  foreign body or underlying fracture. CT MAXILLOFACIAL FINDINGS Osseous: No acute facial bone fractures are identified. The mandibular condyles are normally located. No mandible fracture. Orbits: No orbital fractures. The globes are intact. No intraorbital hematoma. Sinuses: The paranasal sinuses and mastoid air cells are clear. Soft tissues: Right frontal scalp hematoma. CT CERVICAL SPINE FINDINGS Alignment: Degenerative cervical spondylosis with multilevel disc disease and facet disease. Mild degenerative subluxations but the overall alignment is normal. Skull base and vertebrae: The skull base C1 and C1-2 articulations are maintained. Moderate degenerative changes. Some calcified pannus is noted at C1-2 but no significant mass effect on the upper cervical cord. There is a nondisplaced fracture through the anterior base of the dens. This is likely a flexion teardrop type fracture. No other fractures are identified. The facets are intact. No widening of the posterior disc space. No significant prevertebral hematoma is identified. Severe  degenerative disc disease at C4-5, C5-6 and C6-7. Associated discogenic sclerosis and posterior spurring. Soft tissues and spinal canal: No prevertebral fluid or swelling. No visible canal hematoma. 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 Mild to moderate spinal and bilateral foraminal stenosis at C4-5, C5-6 and C6-7. Upper chest: The lung apices are grossly clear. Other: No neck mass or adenopathy. The thyroid gland is grossly normal. IMPRESSION: 1. No acute intracranial findings or skull fracture. 2. Right frontal scalp hematoma but no radiopaque foreign body or underlying fracture. 3. Nondisplaced fracture through the anterior base of the dens, likely a flexion teardrop type fracture. No other cervical spine fractures are identified. 4. 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 There is mild to moderate spinal and bilateral foraminal stenosis at C4-5, C5-6 and C6-7. 5. No acute facial bone fractures are identified. Electronically Signed   By: Marijo Sanes M.D.   On: 03/29/2020 07:00    Review of Systems  Constitutional: Negative for activity change, appetite change, chills and fever.  HENT: Negative for congestion and rhinorrhea.   Eyes: Negative.   Respiratory: Negative for cough, shortness of breath and wheezing.   Gastrointestinal: Negative for abdominal pain, diarrhea, nausea and vomiting.  Endocrine: Negative.   Genitourinary: Negative for difficulty urinating, enuresis and flank pain.  Musculoskeletal: Positive for myalgias, neck pain and neck stiffness.  Skin: Negative.   Allergic/Immunologic: Negative.   Neurological: Positive for weakness, light-headedness and numbness.  Hematological: Negative.   Psychiatric/Behavioral: Negative.     Blood pressure 140/67, pulse 62, temperature (!) 97.4 F (36.3 C), temperature source Oral, resp.  rate 18, height 5\' 4"  (1.626 m), weight 59 kg, SpO2 99 %. Physical Exam Vitals reviewed.  Constitutional:      Appearance: Normal appearance.  HENT:     Head: Normocephalic.     Nose: Nose normal.     Mouth/Throat:     Mouth: Mucous membranes are moist.  Eyes:     Extraocular Movements: Extraocular movements intact.     Conjunctiva/sclera: Conjunctivae normal.     Pupils: Pupils are equal, round, and reactive to light.  Neck:     Comments: Patient in hard cervical collar Cardiovascular:     Rate and Rhythm: Normal rate and regular rhythm.     Pulses: Normal pulses.  Pulmonary:     Effort: Pulmonary effort is normal.     Breath sounds: Normal breath sounds.  Abdominal:     General: Abdomen is flat. There is no distension.  Palpations: Abdomen is soft.  Musculoskeletal:        General: Tenderness present. Normal range of motion.     Cervical back: Tenderness present.  Skin:    General: Skin is warm and dry.  Neurological:     General: No focal deficit present.     Mental Status: She is alert and oriented to person, place, and time.     Cranial Nerves: No cranial nerve deficit.     Sensory: Sensory deficit present.     Comments: Patient with significant pain during assessment, but strength appears intact.  Psychiatric:        Mood and Affect: Mood normal.      Assessment/Plan Ashlee Flores had a syncopal event and suffered a fall on 03/29/2020 where she struck her head. MRI shows chronic degenerative changes with canal stenosis that is worse at C5-6. She will be admitted to inpatient and treated with IV steroids and immobilization at this time. There is no neurosurgical intervention recommended at present.   Patricia Nettle, NP 03/29/2020, 9:12 AM

## 2020-03-29 NOTE — ED Notes (Signed)
Pt got very nauseous, diaphoretic, and BP became 80's/50's while sitting up in bed. Pt laid flat, cold cloth to head, and took BP again. BP is coming back WNL.

## 2020-03-29 NOTE — ED Notes (Signed)
Dr. Annette Stable rounding at bedside

## 2020-03-29 NOTE — ED Provider Notes (Signed)
Emergency Department Provider Note  I have reviewed the triage vital signs and the nursing notes.  HISTORY  Chief Complaint Fall   HPI Ashlee Flores is a 79 y.o. female with multimedical problems documented below who presents to the emergency department today secondary to multiple symptoms.  Patient apparently was going to the bathroom when she stood up she syncopized fell forward hitting her face on the bathtub wound when she came to she had pain in her face and her neck and severe pain in bilateral arms shooting down to her hands. Made it difficult for her to stand secondary to weakness in her arms and pain in her hands.  Husband had help her up EMS brought here for further evaluation.  Patient states she does not have any pain in her chest, back, abdomen, hips or bilateral lower extremities.   No other associated or modifying symptoms.    Past Medical History:  Diagnosis Date  . 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 Diastolic dysfunction 09/13/863  . Essential hypertension 06/04/2016  . Family history of breast cancer   . Family history of ovarian cancer   . Palpitations 06/04/2016  . Paroxysmal atrial fibrillation ([place]) 08/22/2016    Patient Active Problem List   Diagnosis Date Noted  . Diastolic dysfunction 78/46/9629  . Paroxysmal atrial fibrillation ([place]) 08/22/2016  . Essential hypertension 06/04/2016  . Palpitations 06/04/2016  . Genetic testing 01/21/2015  . Breast cancer ([place])   . Family history of ovarian cancer   . Family history of breast cancer     Past Surgical History:  Procedure Laterality Date  . BREAST EXCISIONAL BIOPSY Right   . SALPINGOOPHORECTOMY     preventative, due to [place] of ovarian cancer    Current Outpatient Rx  . Order #: [id number] Class: Normal  . Order #: [id number] Class: Normal  . Order #: [id number] Class: Historical Med  . Order #: [id number] Class: Normal  . Order #: [id number] Class: Normal     Allergies Penicillins  Family History  Problem Relation Age of Onset  . Breast cancer Mother 71  . Hypertension Mother   . Heart attack Father   . Ovarian cancer Sister 87  . Breast cancer Paternal Aunt        dx in her 56s  . Ovarian cancer Maternal Grandmother        died at 14  . Breast cancer Maternal Grandmother   . Melanoma Daughter 32       on leg  . Breast cancer Cousin        dx in her 27s  . Breast cancer Maternal Aunt 34  . Heart attack Maternal Uncle   . Bone cancer Paternal Uncle   . Heart attack Maternal Grandfather   . Heart attack Maternal Aunt   . Prostate cancer Cousin 69    Social History Social History   Tobacco Use  . Smoking status: Never Smoker  . Smokeless tobacco: Never Used  Substance Use Topics  . Alcohol use: Yes  . Drug use: No    Review of Systems  All other systems negative except as documented in the HPI. All pertinent positives and negatives as reviewed in the HPI. ____________________________________________  PHYSICAL EXAM:  VITAL SIGNS: ED Triage Vitals  Enc Vitals Group     BP 03/29/20 0536 (!) 146/79     Pulse Rate 03/29/20 0536 64     Resp 03/29/20 0536 18     Temp 03/29/20 0536 Marland Kitchen)  97.4 F (36.3 C)     Temp Source 03/29/20 0536 Oral     SpO2 03/29/20 0536 100 %     Weight 03/29/20 0536 130 lb (59 kg)     Height 03/29/20 0536 5\' 4"  (1.626 m)    Constitutional: Alert and oriented. Well appearing and in no acute distress. Eyes: Conjunctivae are normal. PERRL. EOMI. Head: Small puncture wound to anterior chin and laceration on inner mouth consistent with a through and through tooth injury. Nose: No congestion/rhinnorhea. Mouth/Throat: Mucous membranes are moist.  Oropharynx non-erythematous. Neck: No stridor.  No meningeal signs.   Cardiovascular: Normal rate, regular rhythm. Good peripheral circulation. Grossly normal heart sounds.   Respiratory: Normal respiratory effort.  No retractions. Lungs  CTAB. Gastrointestinal: Soft and nontender. No distention.  Musculoskeletal: No lower extremity tenderness nor edema. No gross deformities of extremities. Neurologic: Cranial nerves III through XII appear to be intact, she has weakness and severe pain with use of her upper extremities.  Lower extremities are neurologically intact Skin:  Skin is warm, dry and intact. No rash noted.  ____________________________________________   LABS (all labs ordered are listed, but only abnormal results are displayed)  Labs Reviewed  COMPREHENSIVE METABOLIC PANEL - Abnormal; Notable for the following components:      Result Value   Glucose, Bld 158 (*)    Calcium 11.1 (*)    All other components within normal limits  MAGNESIUM - Abnormal; Notable for the following components:   Magnesium 1.6 (*)    All other components within normal limits  SARS CORONAVIRUS 2 BY RT PCR (HOSPITAL ORDER, [facility])  CBC WITH DIFFERENTIAL/PLATELET  URINALYSIS, ROUTINE W REFLEX MICROSCOPIC   ____________________________________________  EKG   EKG Interpretation  Date/Time:  Tuesday March 29 2020 05:39:07 EDT Ventricular Rate:  64 PR Interval:  122 QRS Duration: 92 QT Interval:  392 QTC Calculation: 404 R Axis:   70 Text Interpretation: Sinus rhythm with Premature atrial complexes Nonspecific ST abnormality Abnormal ECG No significant change since last tracing Confirmed by Merrily Pew [phone]) on 03/29/2020 6:06:20 AM       ____________________________________________  RADIOLOGY  No results found. ____________________________________________  PROCEDURES  Procedure(s) performed:   .Critical Care Performed by: Merrily Pew, MD Authorized by: Merrily Pew, MD   Critical care provider statement:    Critical care time (minutes):  45   Critical care was necessary to treat or prevent imminent or life-threatening deterioration of the following conditions:  Trauma and CNS  failure or compromise   Critical care was time spent personally by me on the following activities:  Discussions with consultants, evaluation of patient's response to treatment, examination of patient, ordering and performing treatments and interventions, ordering and review of laboratory studies, ordering and review of radiographic studies, pulse oximetry, re-evaluation of patient's condition, obtaining history from patient or surrogate and review of old charts   ____________________________________________  INITIAL IMPRESSION / [place] / ED COURSE   Patient was immediately alerted as a level 2 trauma secondary to the fall with head injury on blood thinners but also had a syncopal episode.  Unknown immediate transportation to our bed after having walked into the emergency room and being triaged the patient was placed in a c-collar with concern for central cord syndrome with her above findings.  Her lower extremities were normal.  She did have some midline neck tenderness but no other obvious tenderness elsewhere about her body so CT head neck and maxillofacial  were done.  Portable chest was done because of the syncope evaluate for heart size.  No other obvious trauma elsewhere.  No obvious bleeding.    Lab Tests:   I Ordered, reviewed, and interpreted labs, which included CBC, CMP, magnesium and urinalysis.  Medicines ordered:   I ordered medication fentanyl for pain  Imaging Studies ordered:   I independently visualized and interpreted imaging CT of her neck which shows cervical spine fracture.    Additional history obtained:   Additional history obtained from her husband who states that the patient fell and then would not let him help pick her up or call EMS and states she scooted with her feet over to the bed and then was able to help her self up and then she did her hair and then walk to the car and that walked into the emergency department.  Previous records obtained  and reviewed in epic  Consultations Obtained:   I consulted neurosurgery  and discussed lab and imaging findings, they recommend a cervical MRI and they will consult for likely admission, care transferred pendign same.     Critical Interventions: Prompt evaluation Cervical spine immobilization Appropriate imaging studies  ____________________________________________  FINAL CLINICAL IMPRESSION(S) / ED DIAGNOSES  Final diagnoses:  Fall, initial encounter  Spinal stenosis in cervical region    MEDICATIONS GIVEN DURING THIS VISIT:  Medications  fentaNYL (SUBLIMAZE) injection 50 mcg (has no administration in time range)    NEW OUTPATIENT MEDICATIONS STARTED DURING THIS VISIT:  New Prescriptions   No medications on file    Note:  This note was prepared with assistance of Dragon voice recognition software. Occasional wrong-word or sound-a-like substitutions may have occurred due to the inherent limitations of voice recognition software.   Marijo Quizon, Corene Cornea, MD 03/31/20 [phone]

## 2020-03-29 NOTE — ED Triage Notes (Signed)
Pt states that she has had a fall, LOC, pt is on Eliquis  for afib, pt has swelling to lower lip, c/o of bilateral hand pain and pain that shoots down her arms.

## 2020-03-29 NOTE — Progress Notes (Signed)
Patient would not let husband call EMS-insisted on being taken by him to hospital.  Chaplain met with husband Ashlee Flores while patient was having CT scan. He shared he and she have been concerned about her health since a fall back in April requiring hospital care.  When patient came back to the room chaplain had prayer with them.  Ashlee Flores is one of the [employer] from [place] who serves communion here and goes to the [facility].   03/29/20 0600  Clinical Encounter Type  Visited With Patient and family together  Visit Type Spiritual support  Referral From Chaplain  Consult/Referral To Chaplain  Spiritual Encounters  Spiritual Needs Prayer;Emotional  Stress Factors  Patient Stress Factors Other (Comment) (concerns about health)  Conard Novak, Chaplain

## 2020-03-29 NOTE — ED Notes (Signed)
Dinner Tray Ordered @ 1709. 

## 2020-03-29 NOTE — Progress Notes (Signed)
Patient with syncopal episode this morning with resultant fall.  Patient with known cardiac disease including A. fib on chronic anticoagulation.  Patient with neck pain and bilateral upper extremity dysesthetic pain.  Minimal if any weakness.  CT scanning demonstrates evidence of a nondisplaced inferior anterior C2 fracture.  Patient also with marked degenerative disease with significant multilevel cervical spondylosis.  MRI scanning demonstrates severe cervical stenosis predominantly at C4-5 and C5-6 with resultant cord compression.  No evidence of signal abnormality within the cord no evidence of epidural hemorrhage.  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  Continue mobilization and brace.  IV steroids and Neurontin for treatment of her neuropathic pain.  No indication for urgent surgical intervention although patient will likely require cervical decompressive surgery at a later date.  Cardiology consult pending.

## 2020-03-29 NOTE — Consult Note (Signed)
Cardiology Consultation:   Patient ID: Ashlee Flores MRN: [id number]; DOB: Jan 09, 1941  Admit date: 03/29/2020 Date of Consult: 03/29/2020  Primary Care Provider: Jonathon Jordan, MD [facility] Cardiologist: Skeet Latch, MD  [place] Electrophysiologist:  None    Patient Profile:   Ashlee Flores is a 79 y.o. female with a history of paroxysmal atrial fibrillation on Eliquis, grade 2 diastolic dysfunction, hypertension, and breast cancer who is being seen today for the evaluation of atrial fibrillation and syncope at the request of Dr. Annette Stable.  History of Present Illness:   Ashlee Flores is a 79 year old with the above history who is followed by Dr. Oval Linsey. She was diagnosed with atrial fibrillation in 07/2016 after wearing a 30-Day Event Monitor. TSH and BMP normal at the time. She was started on Eliquis and PRN Metoprolol given baseline bradycardia. Last Echo in 09/2016 showed LVEF of 55-60% with grade 2 diastolic dysfunction, mild AI, and mildly elevated PASP of 44 mmHg. Patient last seen by Dr. Oval Linsey in 08/2019 at which time she was doing well. She reports only rare episodes of atrial fibrillation that do not last very long. Heart rates typically go up into the 80's or 90's with this (baseline rates in the 50's to 60's).   Patient presented to ED today after syncopal episode with fall. Patient states she went into atrial fibrillation last night around 7pm. This last for a couple of hours. When she went to bed, she felt better but still thinks she may have been in atrial fibrillation. She woke up at 5am to go to the bathroom. She urinated and then stood up and passed out. She fell and hit her face in the process. She thinks she was only unconscious for a few seconds. However, when she came to she was unable to lift herself off the floor because her arms hurt so bad. No seizure like activity, tongue biting, or bowel/bladder incontinence. She denies any palpitations, chest pain,  shortness of breath, or diaphoresis prior to syncopal episode but does state that she didn't feel quite right. She states that she didn't feel dizziness but felt like she might become dizzy. EMS was called.   Of note, patient does state she fell on 12/12/2019 while walking outside. She describes this as a mechanical fall - denies any symptoms prior to this and states she just fell. She also fell on her face at that time and had 2 black eyes with this. She was seen in the ED after this and head/neck CT showed no acute findings. She does feel like she has had more frequent episodes of atrial fibrillation recently. She has had 2 other episodes so far this month. No orthopnea, PND, or lower extremity edema. No abnormal bleeding on Eliquis. No recent fevers or illnesses.   In the ED, BP somewhat labile labile but vital stable. EKG with normal sinus rhythm with PAC but not acute ischemic changes. Head/Maxillofacial CT showed no acute intracranial bleeding or fracture but did show right frontal scalp hematoma and non-displaced fracture through anterior base of the dens, likely a flexion teardrop type fracture as well as marked degenerative disease with significant multilevel cervical spondylosis. 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 No evidence of epidural heorrhage. Chest x-ray showed no acute findings. CBC 8.1, Hgb 14.9, Plts 239. Na 140, K 3.6, Glucose 158, BUN 16, Cr 0.88, Calcium 11.1.  LFTs normal. Mg 1.6. COVID-19 negative. Neurosurgery consulted - no neurosurgical intervention recommended at this time. Patient admitted for treatment with IV steroids and immobilization.  At the time of this evaluation, patient resting comfortably in no acute distress. C-collar in place. She is still have a lot of bilateral shooting arm pain and difficult moving bilateral  arms.  Past Medical History:  Diagnosis Date  . Breast cancer ([place])    DCIS breast cancer at 44  . Diastolic dysfunction 10/20/7987  . Essential hypertension 06/04/2016  . Family history of breast cancer   . Family history of ovarian cancer   . Palpitations 06/04/2016  . Paroxysmal atrial fibrillation ([place]) 08/22/2016    Past Surgical History:  Procedure Laterality Date  . BREAST EXCISIONAL BIOPSY Right   . 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 Authorizing Provider  acetaminophen (TYLENOL) 500 MG tablet Take 500-1,000 mg by mouth every 6 (six) hours as needed for mild pain.   Yes [provider]  apixaban (ELIQUIS) 5 MG TABS tablet TAKE 1 TABLET (5 MG TOTAL) BY MOUTH 2 (TWO) TIMES DAILY. Patient taking differently: Take 5 mg by mouth 2 (two) times daily.  08/27/19  Yes Skeet Latch, MD  hydrochlorothiazide (HYDRODIURIL) 25 MG tablet Take 1 tablet (25 mg total) by mouth daily. 04/17/19  Yes Skeet Latch, MD  lisinopril (PRINIVIL,ZESTRIL) 10 MG tablet Take 10 mg by mouth daily.  05/20/16  Yes [provider]  metoprolol tartrate (LOPRESSOR) 25 MG tablet 1/2 TABLET AS NEEDED AT ONSET OF AFIB, OK TO TAKE ADDITIONAL 1/2 TAB IF HEART RATE STILL ELEVATED AFTER A COUPLE OF HOURS Patient taking differently: Take 12.5 mg by mouth 2 (two) times daily as needed (Elevated heart rate (afib)). Take 1/2 tablet as needed at the onset of AFIB, it is ok to take an additional 1/2 tablet if heart rate is still elevated after a couple of hours. 12/31/17  Yes Skeet Latch, MD  potassium chloride SA (K-DUR,KLOR-CON) 20 MEQ tablet Take 1 tablet (20 mEq total) by mouth 2 (two) times daily. 05/14/18  Yes Skeet Latch, MD    Inpatient Medications: Scheduled Meds: . dexamethasone (DECADRON) injection  10 mg Intravenous Q6H  . docusate sodium  100 mg Oral  BID  . gabapentin  300 mg Oral TID  . hydrochlorothiazide  25 mg Oral Daily  . lisinopril  10 mg Oral Daily  . potassium chloride SA  20 mEq Oral BID   Continuous Infusions:  PRN Meds: acetaminophen **OR** acetaminophen, bisacodyl, HYDROmorphone (DILAUDID) injection, metoprolol tartrate, ondansetron **OR** ondansetron (ZOFRAN) IV, oxyCODONE, polyethylene glycol  Allergies:    Allergies  Allergen Reactions  . Penicillins Anaphylaxis    Social History:   Social History   Socioeconomic History  . Marital status: Married    Spouse name: Not on file  . Number of children: 4  . Years of education: Not on file  . Highest education level: Not on file  Occupational History  . Not on file  Tobacco Use  . Smoking status: Never Smoker  . Smokeless tobacco: Never Used  Substance and Sexual Activity  . Alcohol use: Yes  . Drug use: No  . Sexual activity: Not on file  Other Topics Concern  . Not on file  Social History Narrative  . Not on file   Social Determinants of Health  Financial Resource Strain:   . Difficulty of Paying Living Expenses:   Food Insecurity:   . Worried About Charity fundraiser in the Last Year:   . Arboriculturist in the Last Year:   Transportation Needs:   . Film/video editor (Medical):   Marland Kitchen Lack of Transportation (Non-Medical):   Physical Activity:   . Days of Exercise per Week:   . Minutes of Exercise per Session:   Stress:   . Feeling of Stress :   Social Connections:   . Frequency of Communication with Friends and Family:   . Frequency of Social Gatherings with Friends and Family:   . Attends Religious Services:   . Active Member of Clubs or Organizations:   . Attends Archivist Meetings:   Marland Kitchen Marital Status:   Intimate Partner Violence:   . Fear of Current or Ex-Partner:   . Emotionally Abused:   Marland Kitchen Physically Abused:   . Sexually Abused:     Family History:    Family History  Problem Relation Age of Onset  . Breast  cancer Mother 62  . Hypertension Mother   . Heart attack Father   . Ovarian cancer Sister 66  . Breast cancer Paternal Aunt        dx in her 48s  . Ovarian cancer Maternal Grandmother        died at 65  . Breast cancer Maternal Grandmother   . Melanoma Daughter 32       on leg  . Breast cancer Cousin        dx in her 76s  . Breast cancer Maternal Aunt 7  . Heart attack Maternal Uncle   . Bone cancer Paternal Uncle   . Heart attack Maternal Grandfather   . Heart attack Maternal Aunt   . Prostate cancer Cousin 17     ROS:  Please see the history of present illness.  Review of Systems  Constitutional: Negative for fever.  HENT: Positive for congestion.   Respiratory: Negative for hemoptysis and shortness of breath.   Cardiovascular: Negative for chest pain, orthopnea, leg swelling and PND.  Gastrointestinal: Negative for blood in stool, melena, nausea and vomiting.  Genitourinary: Negative for hematuria.  Musculoskeletal: Positive for falls and neck pain.  Neurological: Positive for loss of consciousness.       Bilateral upper extremity pain.  Endo/Heme/Allergies: Does not bruise/bleed easily.  Psychiatric/Behavioral: Negative for substance abuse.    Physical Exam/Data:   Vitals:   03/29/20 1300 03/29/20 1315 03/29/20 1330 03/29/20 1529  BP: 135/66 (!) 129/57 137/70 140/67  Pulse: 62 65 73   Resp: 14 20 16    Temp:      TempSrc:      SpO2: 97% 96% 97%   Weight:      Height:        Intake/Output Summary (Last 24 hours) at 03/29/2020 1559 Last data filed at 03/29/2020 6767 Gross per 24 hour  Intake 50 ml  Output -  Net 50 ml   Last 3 Weights 03/29/2020 12/13/2019 08/27/2019  Weight (lbs) 130 lb 125 lb 126 lb  Weight (kg) 58.968 kg 56.7 kg 57.153 kg     Body mass index is 22.31 kg/m.  General: 79 y.o. female resting comfortably in no acute distress. HEENT: Normocephalic and atraumatic. Sclera clear.  Neck: Supple.Unable to assess carotid bruits or JVD due to  C-collar. Heart: RRR. Distinct S1 and S2. No murmurs, gallops, or rubs. Radial and distal  pedal pulses 2+ and equal bilaterally. Lungs: No increased work of breathing. Clear to ausculation bilaterally. No wheezes, rhonchi, or rales.  Abdomen: Soft, non-distended, and non-tender to palpation. Bowel sounds present. MSK: Normal strength and tone for age. Extremities: No lower extremity edema.    Skin: Warm and dry. Neuro: Alert and oriented x3. No focal deficits. Psych: Normal affect. Responds appropriately.   EKG:  The EKG was personally reviewed and demonstrates:  Normal sinus rhythm, 64 bpm, with PACs and a lot of underlying artifact but no acute ST/T changes.   Telemetry:  Telemetry was personally reviewed and demonstrates: Normal sinus rhythm with rates in the 60's to 80's.  Relevant CV Studies:  Echocardiogram 09/17/2016: Study Conclusions: - Left ventricle: The cavity size was normal. Wall thickness was  normal. Systolic function was normal. The estimated ejection  fraction was in the range of 55% to 60%. Wall motion was normal;  there were no regional wall motion abnormalities. Features are  consistent with a pseudonormal left ventricular filling pattern,  with concomitant abnormal relaxation and increased filling  pressure (grade 2 diastolic dysfunction).  - Aortic valve: There was no stenosis. There was mild  regurgitation.  - Mitral valve: There was trivial regurgitation.  - Left atrium: The atrium was mildly dilated.  - Right ventricle: The cavity size was normal. Systolic function  was normal.  - Tricuspid valve: Peak RV-RA gradient (S): 29 mm Hg.  - Pulmonary arteries: PA peak pressure: 44 mm Hg (S).  - Systemic veins: IVC measured 2.2 cm with < 50% respirophasic  variation, suggesting RA pressure 15 mmHg.   Impressions: - Normal LV size with EF 55-60%. Moderate diastolic dysfunction.  Normal RV size and systolic function. Mild aortic insufficiency.   Mild pulmonary hypertension.  Laboratory Data:  High Sensitivity Troponin:  No results for input(s): TROPONINIHS in the last 720 hours.   Chemistry Recent Labs  Lab 03/29/20 0559  NA 140  K 3.6  CL 104  CO2 23  GLUCOSE 158*  BUN 16  CREATININE 0.88  CALCIUM 11.1*  GFRNONAA >60  GFRAA >60  ANIONGAP 13    Recent Labs  Lab 03/29/20 0559  PROT 6.7  ALBUMIN 4.2  AST 22  ALT 20  ALKPHOS 61  BILITOT 1.0   Hematology Recent Labs  Lab 03/29/20 0559  WBC 8.1  RBC 4.80  HGB 14.9  HCT 43.2  MCV 90.0  MCH 31.0  MCHC 34.5  RDW 13.2  PLT 239   BNPNo results for input(s): BNP, PROBNP in the last 168 hours.  DDimer No results for input(s): DDIMER in the last 168 hours.   Radiology/Studies:  CT Head Wo Contrast  Result Date: 03/29/2020 CLINICAL DATA:  [place]. Hit head. EXAM: CT HEAD WITHOUT CONTRAST CT MAXILLOFACIAL WITHOUT CONTRAST CT CERVICAL SPINE WITHOUT CONTRAST TECHNIQUE: Multidetector CT imaging of the head, cervical spine, and maxillofacial structures were performed using the standard protocol without intravenous contrast. Multiplanar CT image reconstructions of the cervical spine and maxillofacial structures were also generated. COMPARISON:  CT head and face 12/13/2019 is FINDINGS: CT HEAD FINDINGS Brain: The ventricles are in the midline without mass effect or shift. They are normal in size and configuration for age and degree of cerebral atrophy. No extra-axial fluid collections are identified. The gray-white differentiation is maintained. No CT findings for acute hemispheric infarction or intracranial hemorrhage. No mass lesions. The brainstem and cerebellum are unremarkable and stable. Vascular: Vascular calcifications but no aneurysm or hyperdense vessels. Skull: No skull  fracture or bone lesions. Other: Right frontal scalp hematoma is noted but no radiopaque foreign body or underlying fracture. CT MAXILLOFACIAL FINDINGS Osseous: No acute facial bone fractures are  identified. The mandibular condyles are normally located. No mandible fracture. Orbits: No orbital fractures. The globes are intact. No intraorbital hematoma. Sinuses: The paranasal sinuses and mastoid air cells are clear. Soft tissues: Right frontal scalp hematoma. CT CERVICAL SPINE FINDINGS Alignment: Degenerative cervical spondylosis with multilevel disc disease and facet disease. Mild degenerative subluxations but the overall alignment is normal. Skull base and vertebrae: The skull base C1 and C1-2 articulations are maintained. Moderate degenerative changes. Some calcified pannus is noted at C1-2 but no significant mass effect on the upper cervical cord. There is a nondisplaced fracture through the anterior base of the dens. This is likely a flexion teardrop type fracture. No other fractures are identified. The facets are intact. No widening of the posterior disc space. No significant prevertebral hematoma is identified. Severe degenerative disc disease at C4-5, C5-6 and C6-7. Associated discogenic sclerosis and posterior spurring. Soft tissues and spinal canal: No prevertebral fluid or swelling. No visible canal hematoma. 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 Mild to moderate spinal and bilateral foraminal stenosis at C4-5, C5-6 and C6-7. Upper chest: The lung apices are grossly clear. Other: No neck mass or adenopathy. The thyroid gland is grossly normal. IMPRESSION: 1. No acute intracranial findings or skull fracture. 2. Right frontal scalp hematoma but no radiopaque foreign body or underlying fracture. 3. Nondisplaced fracture through the anterior base of the dens, likely a flexion teardrop type fracture. No other cervical spine fractures are identified. 4. 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 There is mild to moderate spinal and bilateral foraminal  stenosis at C4-5, C5-6 and C6-7. 5. No acute facial bone fractures are identified. Electronically Signed   By: Marijo Sanes M.D.   On: 03/29/2020 07:00   CT Cervical Spine Wo Contrast  Result Date: 03/29/2020 CLINICAL DATA:  [place]. Hit head. EXAM: CT HEAD WITHOUT CONTRAST CT MAXILLOFACIAL WITHOUT CONTRAST CT CERVICAL SPINE WITHOUT CONTRAST TECHNIQUE: Multidetector CT imaging of the head, cervical spine, and maxillofacial structures were performed using the standard protocol without intravenous contrast. Multiplanar CT image reconstructions of the cervical spine and maxillofacial structures were also generated. COMPARISON:  CT head and face 12/13/2019 is FINDINGS: CT HEAD FINDINGS Brain: The ventricles are in the midline without mass effect or shift. They are normal in size and configuration for age and degree of cerebral atrophy. No extra-axial fluid collections are identified. The gray-white differentiation is maintained. No CT findings for acute hemispheric infarction or intracranial hemorrhage. No mass lesions. The brainstem and cerebellum are unremarkable and stable. Vascular: Vascular calcifications but no aneurysm or hyperdense vessels. Skull: No skull fracture or bone lesions. Other: Right frontal scalp hematoma is noted but no radiopaque foreign body or underlying fracture. CT MAXILLOFACIAL FINDINGS Osseous: No acute facial bone fractures are identified. The mandibular condyles are normally located. No mandible fracture. Orbits: No orbital fractures. The globes are intact. No intraorbital hematoma. Sinuses: The paranasal sinuses and mastoid air cells are clear. Soft tissues: Right frontal scalp hematoma. CT CERVICAL SPINE FINDINGS Alignment: Degenerative cervical spondylosis with multilevel disc disease and facet disease. Mild degenerative subluxations but the overall alignment is normal. Skull base and vertebrae: The skull base C1 and C1-2 articulations are maintained. Moderate degenerative changes.  Some calcified  pannus is noted at C1-2 but no significant mass effect on the upper cervical cord. There is a nondisplaced fracture through the anterior base of the dens. This is likely a flexion teardrop type fracture. No other fractures are identified. The facets are intact. No widening of the posterior disc space. No significant prevertebral hematoma is identified. Severe degenerative disc disease at C4-5, C5-6 and C6-7. Associated discogenic sclerosis and posterior spurring. Soft tissues and spinal canal: No prevertebral fluid or swelling. No visible canal hematoma. 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 Mild to moderate spinal and bilateral foraminal stenosis at C4-5, C5-6 and C6-7. Upper chest: The lung apices are grossly clear. Other: No neck mass or adenopathy. The thyroid gland is grossly normal. IMPRESSION: 1. No acute intracranial findings or skull fracture. 2. Right frontal scalp hematoma but no radiopaque foreign body or underlying fracture. 3. Nondisplaced fracture through the anterior base of the dens, likely a flexion teardrop type fracture. No other cervical spine fractures are identified. 4. 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 There is mild to moderate spinal and bilateral foraminal stenosis at C4-5, C5-6 and C6-7. 5. No acute facial bone fractures are identified. Electronically Signed   By: Marijo Sanes M.D.   On: 03/29/2020 07:00   MR Cervical Spine Wo Contrast  Result Date: 03/29/2020 CLINICAL DATA:  Fall, fracture EXAM: MRI CERVICAL SPINE WITHOUT CONTRAST TECHNIQUE: Multiplanar, multisequence MR imaging of the cervical spine was performed. No intravenous contrast was administered. COMPARISON:  Correlation made with prior CT FINDINGS: Alignment: There is mild retrolisthesis at C4-C5, C5-C6, C6-C7 on a degenerative basis. Vertebrae:  Oblique nondisplaced fracture through the anterior inferior corner of C2 as seen on CT. Trace associated marrow edema. Marrow signal is mildly heterogeneous likely reflecting osteopenia degenerative endplate changes. Cord: No abnormal signal. Posterior Fossa, vertebral arteries, paraspinal tissues: There is minimal prevertebral fluid at the C4 and C5 levels. Otherwise unremarkable. Disc levels: C2-C3:  Facet hypertrophy.  No significant canal foraminal stenosis. C3-C4: Disc bulge slightly eccentric to right and facet uncovertebral hypertrophy. Mild canal stenosis. Minor right foraminal stenosis. No significant left foraminal stenosis C4-C5: Disc bulge with calcification, endplate osteophytes, and facet and uncovertebral hypertrophy. Moderate canal stenosis. Moderate to marked right and marked left foraminal stenosis. C5-C6: Disc bulge with calcification, endplate osteophytes, and facet and uncovertebral hypertrophy. Marked canal stenosis. Marked foraminal stenosis. C6-C7: Disc bulge with calcification, endplate osteophytes, and facet and uncovertebral hypertrophy. Mild to moderate canal stenosis. Moderate, left greater than right foraminal stenosis. C7-T1: Facet hypertrophy. No significant canal or foraminal stenosis. IMPRESSION: Oblique nondisplaced fracture through the anterior inferior corner of C2 as seen on CT. Minimal prevertebral fluid is present at C4 and C5 levels. No evidence of ligament disruption. Multilevel degenerative changes as detailed above. There is marked canal stenosis at C5-C6. Multilevel foraminal stenosis, greatest from C4-C5 through C6-C7. Electronically Signed   By: Macy Mis M.D.   On: 03/29/2020 09:02   DG Chest Portable 1 View  Result Date: 03/29/2020 CLINICAL DATA:  [place]. Chest pain. EXAM: PORTABLE CHEST 1 VIEW COMPARISON:  None. FINDINGS: The cardiac silhouette, mediastinal and hilar contours are within normal limits given the AP projection, portable technique and supine  position of the patient. The lungs are clear. No pleural effusion or pneumothorax. No worrisome pulmonary lesions. The bony thorax is intact. IMPRESSION: No acute cardiopulmonary findings. Electronically Signed   By: Mamie Nick.  Gallerani M.D.   On: 03/29/2020 06:49   CT Maxillofacial Wo Contrast  Result Date: 03/29/2020 CLINICAL DATA:  [place]. Hit head. EXAM: CT HEAD WITHOUT CONTRAST CT MAXILLOFACIAL WITHOUT CONTRAST CT CERVICAL SPINE WITHOUT CONTRAST TECHNIQUE: Multidetector CT imaging of the head, cervical spine, and maxillofacial structures were performed using the standard protocol without intravenous contrast. Multiplanar CT image reconstructions of the cervical spine and maxillofacial structures were also generated. COMPARISON:  CT head and face 12/13/2019 is FINDINGS: CT HEAD FINDINGS Brain: The ventricles are in the midline without mass effect or shift. They are normal in size and configuration for age and degree of cerebral atrophy. No extra-axial fluid collections are identified. The gray-white differentiation is maintained. No CT findings for acute hemispheric infarction or intracranial hemorrhage. No mass lesions. The brainstem and cerebellum are unremarkable and stable. Vascular: Vascular calcifications but no aneurysm or hyperdense vessels. Skull: No skull fracture or bone lesions. Other: Right frontal scalp hematoma is noted but no radiopaque foreign body or underlying fracture. CT MAXILLOFACIAL FINDINGS Osseous: No acute facial bone fractures are identified. The mandibular condyles are normally located. No mandible fracture. Orbits: No orbital fractures. The globes are intact. No intraorbital hematoma. Sinuses: The paranasal sinuses and mastoid air cells are clear. Soft tissues: Right frontal scalp hematoma. CT CERVICAL SPINE FINDINGS Alignment: Degenerative cervical spondylosis with multilevel disc disease and facet disease. Mild degenerative subluxations but the overall alignment is normal. Skull base  and vertebrae: The skull base C1 and C1-2 articulations are maintained. Moderate degenerative changes. Some calcified pannus is noted at C1-2 but no significant mass effect on the upper cervical cord. There is a nondisplaced fracture through the anterior base of the dens. This is likely a flexion teardrop type fracture. No other fractures are identified. The facets are intact. No widening of the posterior disc space. No significant prevertebral hematoma is identified. Severe degenerative disc disease at C4-5, C5-6 and C6-7. Associated discogenic sclerosis and posterior spurring. Soft tissues and spinal canal: No prevertebral fluid or swelling. No visible canal hematoma. 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 Mild to moderate spinal and bilateral foraminal stenosis at C4-5, C5-6 and C6-7. Upper chest: The lung apices are grossly clear. Other: No neck mass or adenopathy. The thyroid gland is grossly normal. IMPRESSION: 1. No acute intracranial findings or skull fracture. 2. Right frontal scalp hematoma but no radiopaque foreign body or underlying fracture. 3. Nondisplaced fracture through the anterior base of the dens, likely a flexion teardrop type fracture. No other cervical spine fractures are identified. 4. 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 There is mild to moderate spinal and bilateral foraminal stenosis at C4-5, C5-6 and C6-7. 5. No acute facial bone fractures are identified. Electronically Signed   By: Marijo Sanes M.D.   On: 03/29/2020 07:00    Assessment and Plan:   Syncope - Sounds like micturition syncope. - EKG shows no acute ischemic findings.  - Telemetry shows normal sinus rhythm with rates in the 60's to 80's. No significant arrhythmias to explain syncope. - Normal LVEF on Echo in 2018. Will repeat.  - Continue to monitor on  telemetry. May need to consider outpatient monitor at discharge to assess atrial fibrillation burden. - When able to stand, consider checking orthostatic vital signs.   Paroxysmal Atrial Fibrillation - Currently in normal sinus rhythm. However, patient states she had an episode of atrial fibrillation last night  that last for a couple of hours.  - Rates currently well controlled.  - Only on Lopressor PRN at home due to to baseline bradycardia so can continue this.  - Home Eliquis on hold given cervical spine fracture. No plans for any surgery at this time. Restart when OK with Neurosurgery.   Hypertension - BP somewhat labile in the ED but has seemed to stabilize.  - Continue home medications: HCTZ 25mg  daily and Lisinopril 10mg  daily.   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 Also showed severe cervical stenosis predominantly at C4-5 and C5-6 with resultant cord compression.  - Neurosurgery is following. Being treated with IV steroid and Neurontin for neuropathic pain. Continue mobilization and brace.    For questions or updates, please contact [place] Please consult [email or web address] for contact info under    Signed, Darreld Mclean, PA-C  03/29/2020 3:59 PM

## 2020-03-29 NOTE — Progress Notes (Signed)
Orthopedic Tech Progress Note Patient Details:  Ashlee Flores [birth year] [id number] Level 2 Trauma  Patient ID: Ashlee Flores, female   DOB: 1941/09/02, 79 y.o.   MRN: [id number]   Ashlee Flores 03/29/2020, 5:49 AM

## 2020-03-29 NOTE — ED Notes (Signed)
Patient transported to MRI 

## 2020-03-29 NOTE — ED Notes (Signed)
Lunch Tray Ordered @ [id number].

## 2020-03-29 NOTE — ED Notes (Signed)
Pt able to tolerate crackers

## 2020-03-29 NOTE — Progress Notes (Signed)
Medical Staff working with patient. She requested her husband to come to be with her.  Chaplain brought him to her side.    03/29/20 0500  Clinical Encounter Type  Visited With Patient and family together  Visit Type ED  Referral From Nurse  Consult/Referral To [place] Christien Berthelot, Chaplain

## 2020-03-30 ENCOUNTER — Telehealth: Payer: Self-pay | Admitting: Cardiovascular Disease

## 2020-03-30 ENCOUNTER — Inpatient Hospital Stay (HOSPITAL_COMMUNITY): Payer: Medicare Other

## 2020-03-30 DIAGNOSIS — I351 Nonrheumatic aortic (valve) insufficiency: Secondary | ICD-10-CM | POA: Diagnosis not present

## 2020-03-30 DIAGNOSIS — I361 Nonrheumatic tricuspid (valve) insufficiency: Secondary | ICD-10-CM | POA: Diagnosis not present

## 2020-03-30 LAB — ECHOCARDIOGRAM COMPLETE
Area-P 1/2: 3.53 cm2
Height: 64 in
P 1/2 time: 360 msec
S' Lateral: 2.7 cm
Weight: 2080 oz

## 2020-03-30 NOTE — Progress Notes (Addendum)
Progress Note  Patient Name: Ashlee Flores Date of Encounter: 03/30/2020  [facility] Cardiologist: Skeet Latch, MD   Subjective   Feeling well other than pain in bilateral hands and lower arms.  Pain in worse in the thumbs and second fingers.  Denies atrial fibrillation or presyncope.  Inpatient Medications    Scheduled Meds: . dexamethasone (DECADRON) injection  10 mg Intravenous Q6H  . docusate sodium  100 mg Oral BID  . gabapentin  300 mg Oral TID  . hydrochlorothiazide  25 mg Oral Daily  . lisinopril  10 mg Oral Daily  . potassium chloride SA  20 mEq Oral BID   Continuous Infusions:  PRN Meds: acetaminophen **OR** acetaminophen, bisacodyl, HYDROmorphone (DILAUDID) injection, metoprolol tartrate, ondansetron **OR** ondansetron (ZOFRAN) IV, oxyCODONE, polyethylene glycol   Vital Signs    Vitals:   03/29/20 2345 03/30/20 0300 03/30/20 0828 03/30/20 0910  BP: 136/63 (!) 154/76 126/64   Pulse: 62 68 64   Resp: 20 18 16    Temp: 98.1 F (36.7 C) 98 F (36.7 C) 100.1 F (37.8 C) 98.6 F (37 C)  TempSrc: Oral Oral Oral Axillary  SpO2: 94% 97% 95%   Weight:      Height:        Intake/Output Summary (Last 24 hours) at 03/30/2020 1231 Last data filed at 03/30/2020 0905 Gross per 24 hour  Intake 120 ml  Output --  Net 120 ml   Last 3 Weights 03/29/2020 12/13/2019 08/27/2019  Weight (lbs) 130 lb 125 lb 126 lb  Weight (kg) 58.968 kg 56.7 kg 57.153 kg      Telemetry    n/a - Personally Reviewed  ECG    n/a - Personally Reviewed  Physical Exam   VS:  BP 126/64 (BP Location: Left Arm)   Pulse 64   Temp 98.6 F (37 C) (Axillary)   Resp 16   Ht 5\' 4"  (1.626 m)   Wt 59 kg   SpO2 95%   BMI 22.31 kg/m  , BMI Body mass index is 22.31 kg/m. GENERAL:  Well appearing.  No acute distress HEENT: Pupils equal round and reactive, fundi not visualized, oral mucosa unremarkable NECK:  C-collar brace in place LUNGS:  Clear to auscultation bilaterally HEART:   RRR.  PMI not displaced or sustained,S1 and S2 within normal limits, no S3, no S4, no clicks, no rubs, no murmurs ABD:  Flat, positive bowel sounds normal in frequency in pitch, no bruits, no rebound, no guarding, no midline pulsatile mass, no hepatomegaly, no splenomegaly EXT:  2 plus pulses throughout, no edema, no cyanosis no clubbing SKIN:  No rashes no nodules NEURO:  Cranial nerves II through XII grossly intact, motor grossly intact throughout PSYCH:  Cognitively intact, oriented to person place and time   Labs    High Sensitivity Troponin:  No results for input(s): TROPONINIHS in the last 720 hours.    Chemistry Recent Labs  Lab 03/29/20 0559  NA 140  K 3.6  CL 104  CO2 23  GLUCOSE 158*  BUN 16  CREATININE 0.88  CALCIUM 11.1*  PROT 6.7  ALBUMIN 4.2  AST 22  ALT 20  ALKPHOS 61  BILITOT 1.0  GFRNONAA >60  GFRAA >60  ANIONGAP 13     Hematology Recent Labs  Lab 03/29/20 0559  WBC 8.1  RBC 4.80  HGB 14.9  HCT 43.2  MCV 90.0  MCH 31.0  MCHC 34.5  RDW 13.2  PLT 239    BNPNo results  for input(s): BNP, PROBNP in the last 168 hours.   DDimer No results for input(s): DDIMER in the last 168 hours.   Radiology    CT Head Wo Contrast  Result Date: 03/29/2020 CLINICAL DATA:  [place]. Hit head. EXAM: CT HEAD WITHOUT CONTRAST CT MAXILLOFACIAL WITHOUT CONTRAST CT CERVICAL SPINE WITHOUT CONTRAST TECHNIQUE: Multidetector CT imaging of the head, cervical spine, and maxillofacial structures were performed using the standard protocol without intravenous contrast. Multiplanar CT image reconstructions of the cervical spine and maxillofacial structures were also generated. COMPARISON:  CT head and face 12/13/2019 is FINDINGS: CT HEAD FINDINGS Brain: The ventricles are in the midline without mass effect or shift. They are normal in size and configuration for age and degree of cerebral atrophy. No extra-axial fluid collections are identified. The gray-white differentiation is  maintained. No CT findings for acute hemispheric infarction or intracranial hemorrhage. No mass lesions. The brainstem and cerebellum are unremarkable and stable. Vascular: Vascular calcifications but no aneurysm or hyperdense vessels. Skull: No skull fracture or bone lesions. Other: Right frontal scalp hematoma is noted but no radiopaque foreign body or underlying fracture. CT MAXILLOFACIAL FINDINGS Osseous: No acute facial bone fractures are identified. The mandibular condyles are normally located. No mandible fracture. Orbits: No orbital fractures. The globes are intact. No intraorbital hematoma. Sinuses: The paranasal sinuses and mastoid air cells are clear. Soft tissues: Right frontal scalp hematoma. CT CERVICAL SPINE FINDINGS Alignment: Degenerative cervical spondylosis with multilevel disc disease and facet disease. Mild degenerative subluxations but the overall alignment is normal. Skull base and vertebrae: The skull base C1 and C1-2 articulations are maintained. Moderate degenerative changes. Some calcified pannus is noted at C1-2 but no significant mass effect on the upper cervical cord. There is a nondisplaced fracture through the anterior base of the dens. This is likely a flexion teardrop type fracture. No other fractures are identified. The facets are intact. No widening of the posterior disc space. No significant prevertebral hematoma is identified. Severe degenerative disc disease at C4-5, C5-6 and C6-7. Associated discogenic sclerosis and posterior spurring. Soft tissues and spinal canal: No prevertebral fluid or swelling. No visible canal hematoma. 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 Mild to moderate spinal and bilateral foraminal stenosis at C4-5, C5-6 and C6-7. Upper chest: The lung apices are grossly clear. Other: No neck mass or adenopathy. The thyroid gland is grossly normal. IMPRESSION:  1. No acute intracranial findings or skull fracture. 2. Right frontal scalp hematoma but no radiopaque foreign body or underlying fracture. 3. Nondisplaced fracture through the anterior base of the dens, likely a flexion teardrop type fracture. No other cervical spine fractures are identified. 4. 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 There is mild to moderate spinal and bilateral foraminal stenosis at C4-5, C5-6 and C6-7. 5. No acute facial bone fractures are identified. Electronically Signed   By: Marijo Sanes M.D.   On: 03/29/2020 07:00   CT Cervical Spine Wo Contrast  Result Date: 03/29/2020 CLINICAL DATA:  [place]. Hit head. EXAM: CT HEAD WITHOUT CONTRAST CT MAXILLOFACIAL WITHOUT CONTRAST CT CERVICAL SPINE WITHOUT CONTRAST TECHNIQUE: Multidetector CT imaging of the head, cervical spine, and maxillofacial structures were performed using the standard protocol without intravenous contrast. Multiplanar CT image reconstructions of the cervical spine and maxillofacial structures were also generated. COMPARISON:  CT head and face 12/13/2019 is FINDINGS: CT HEAD FINDINGS Brain: The ventricles are in the  midline without mass effect or shift. They are normal in size and configuration for age and degree of cerebral atrophy. No extra-axial fluid collections are identified. The gray-white differentiation is maintained. No CT findings for acute hemispheric infarction or intracranial hemorrhage. No mass lesions. The brainstem and cerebellum are unremarkable and stable. Vascular: Vascular calcifications but no aneurysm or hyperdense vessels. Skull: No skull fracture or bone lesions. Other: Right frontal scalp hematoma is noted but no radiopaque foreign body or underlying fracture. CT MAXILLOFACIAL FINDINGS Osseous: No acute facial bone fractures are identified. The mandibular condyles are normally located. No mandible fracture. Orbits: No orbital fractures. The globes are  intact. No intraorbital hematoma. Sinuses: The paranasal sinuses and mastoid air cells are clear. Soft tissues: Right frontal scalp hematoma. CT CERVICAL SPINE FINDINGS Alignment: Degenerative cervical spondylosis with multilevel disc disease and facet disease. Mild degenerative subluxations but the overall alignment is normal. Skull base and vertebrae: The skull base C1 and C1-2 articulations are maintained. Moderate degenerative changes. Some calcified pannus is noted at C1-2 but no significant mass effect on the upper cervical cord. There is a nondisplaced fracture through the anterior base of the dens. This is likely a flexion teardrop type fracture. No other fractures are identified. The facets are intact. No widening of the posterior disc space. No significant prevertebral hematoma is identified. Severe degenerative disc disease at C4-5, C5-6 and C6-7. Associated discogenic sclerosis and posterior spurring. Soft tissues and spinal canal: No prevertebral fluid or swelling. No visible canal hematoma. 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 Mild to moderate spinal and bilateral foraminal stenosis at C4-5, C5-6 and C6-7. Upper chest: The lung apices are grossly clear. Other: No neck mass or adenopathy. The thyroid gland is grossly normal. IMPRESSION: 1. No acute intracranial findings or skull fracture. 2. Right frontal scalp hematoma but no radiopaque foreign body or underlying fracture. 3. Nondisplaced fracture through the anterior base of the dens, likely a flexion teardrop type fracture. No other cervical spine fractures are identified. 4. 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 There is mild to moderate spinal and bilateral foraminal stenosis at C4-5, C5-6 and C6-7. 5. No acute facial bone fractures are identified. Electronically Signed   By: Marijo Sanes  M.D.   On: 03/29/2020 07:00   MR Cervical Spine Wo Contrast  Result Date: 03/29/2020 CLINICAL DATA:  Fall, fracture EXAM: MRI CERVICAL SPINE WITHOUT CONTRAST TECHNIQUE: Multiplanar, multisequence MR imaging of the cervical spine was performed. No intravenous contrast was administered. COMPARISON:  Correlation made with prior CT FINDINGS: Alignment: There is mild retrolisthesis at C4-C5, C5-C6, C6-C7 on a degenerative basis. Vertebrae: Oblique nondisplaced fracture through the anterior inferior corner of C2 as seen on CT. Trace associated marrow edema. Marrow signal is mildly heterogeneous likely reflecting osteopenia degenerative endplate changes. Cord: No abnormal signal. Posterior Fossa, vertebral arteries, paraspinal tissues: There is minimal prevertebral fluid at the C4 and C5 levels. Otherwise unremarkable. Disc levels: C2-C3:  Facet hypertrophy.  No significant canal foraminal stenosis. C3-C4: Disc bulge slightly eccentric to right and facet uncovertebral hypertrophy. Mild canal stenosis. Minor right foraminal stenosis. No significant left foraminal stenosis C4-C5: Disc bulge with calcification, endplate osteophytes, and facet and uncovertebral hypertrophy. Moderate canal stenosis. Moderate to marked right and marked left foraminal stenosis. C5-C6: Disc bulge with calcification, endplate osteophytes, and facet and uncovertebral hypertrophy. Marked canal stenosis. Marked foraminal stenosis. C6-C7: Disc bulge with  calcification, endplate osteophytes, and facet and uncovertebral hypertrophy. Mild to moderate canal stenosis. Moderate, left greater than right foraminal stenosis. C7-T1: Facet hypertrophy. No significant canal or foraminal stenosis. IMPRESSION: Oblique nondisplaced fracture through the anterior inferior corner of C2 as seen on CT. Minimal prevertebral fluid is present at C4 and C5 levels. No evidence of ligament disruption. Multilevel degenerative changes as detailed above. There is marked canal  stenosis at C5-C6. Multilevel foraminal stenosis, greatest from C4-C5 through C6-C7. Electronically Signed   By: Macy Mis M.D.   On: 03/29/2020 09:02   DG Chest Portable 1 View  Result Date: 03/29/2020 CLINICAL DATA:  [place]. Chest pain. EXAM: PORTABLE CHEST 1 VIEW COMPARISON:  None. FINDINGS: The cardiac silhouette, mediastinal and hilar contours are within normal limits given the AP projection, portable technique and supine position of the patient. The lungs are clear. No pleural effusion or pneumothorax. No worrisome pulmonary lesions. The bony thorax is intact. IMPRESSION: No acute cardiopulmonary findings. Electronically Signed   By: Marijo Sanes M.D.   On: 03/29/2020 06:49   CT Maxillofacial Wo Contrast  Result Date: 03/29/2020 CLINICAL DATA:  [place]. Hit head. EXAM: CT HEAD WITHOUT CONTRAST CT MAXILLOFACIAL WITHOUT CONTRAST CT CERVICAL SPINE WITHOUT CONTRAST TECHNIQUE: Multidetector CT imaging of the head, cervical spine, and maxillofacial structures were performed using the standard protocol without intravenous contrast. Multiplanar CT image reconstructions of the cervical spine and maxillofacial structures were also generated. COMPARISON:  CT head and face 12/13/2019 is FINDINGS: CT HEAD FINDINGS Brain: The ventricles are in the midline without mass effect or shift. They are normal in size and configuration for age and degree of cerebral atrophy. No extra-axial fluid collections are identified. The gray-white differentiation is maintained. No CT findings for acute hemispheric infarction or intracranial hemorrhage. No mass lesions. The brainstem and cerebellum are unremarkable and stable. Vascular: Vascular calcifications but no aneurysm or hyperdense vessels. Skull: No skull fracture or bone lesions. Other: Right frontal scalp hematoma is noted but no radiopaque foreign body or underlying fracture. CT MAXILLOFACIAL FINDINGS Osseous: No acute facial bone fractures are identified. The mandibular  condyles are normally located. No mandible fracture. Orbits: No orbital fractures. The globes are intact. No intraorbital hematoma. Sinuses: The paranasal sinuses and mastoid air cells are clear. Soft tissues: Right frontal scalp hematoma. CT CERVICAL SPINE FINDINGS Alignment: Degenerative cervical spondylosis with multilevel disc disease and facet disease. Mild degenerative subluxations but the overall alignment is normal. Skull base and vertebrae: The skull base C1 and C1-2 articulations are maintained. Moderate degenerative changes. Some calcified pannus is noted at C1-2 but no significant mass effect on the upper cervical cord. There is a nondisplaced fracture through the anterior base of the dens. This is likely a flexion teardrop type fracture. No other fractures are identified. The facets are intact. No widening of the posterior disc space. No significant prevertebral hematoma is identified. Severe degenerative disc disease at C4-5, C5-6 and C6-7. Associated discogenic sclerosis and posterior spurring. Soft tissues and spinal canal: No prevertebral fluid or swelling. No visible canal hematoma. 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 Mild to moderate spinal and bilateral foraminal stenosis at C4-5, C5-6 and C6-7. Upper chest: The lung apices are grossly clear. Other: No neck mass or adenopathy. The thyroid gland is grossly normal. IMPRESSION: 1. No acute intracranial findings or skull fracture. 2. Right frontal scalp hematoma but no radiopaque foreign body or underlying fracture. 3. Nondisplaced  fracture through the anterior base of the dens, likely a flexion teardrop type fracture. No other cervical spine fractures are identified. 4. 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 There is mild to moderate spinal and bilateral foraminal stenosis at C4-5, C5-6 and  C6-7. 5. No acute facial bone fractures are identified. Electronically Signed   By: Marijo Sanes M.D.   On: 03/29/2020 07:00    Cardiac Studies   Echo pending  Patient Profile     79 y.o. female with paroxysmal atrial fibrillation, chronic diastolic heart failure and hypertension who presented after a syncopal episode with cervical spine fracture.  Assessment & Plan    # PAF:  # Syncope:  Ms. Morden had an episode of atrial fibrillation and took metoprolol 12.5mg .  Later the next AM she was no longer in atrial fibrillation and had a syncopal event after going to the bathroom.  She has bradycardia at baseline that was likely worse after taking metoprolol and compounded by standing from sleep and micturition.  She is not currently on cardiac monitoring.  We will order this.  Echo pending.  Plan for 2 week ambulatory monitor at discharge and f/u with me one week after the monitor to consider antiarrhythmics vs referral to EP for consideration of ablation based on monitor results.  No driving 2/2 syncope and C-collar.  Resume anticoagulation with Eliquis when deemed stable per neurosurgery.       For questions or updates, please contact [place] Please consult [email or web address] for contact info under        Signed, Skeet Latch, MD  03/30/2020, 12:31 PM     Please notify cardiology on the day of discharge so that a zio patch cardiac monitor can be placed prior to going home. Will arrange outpatient follow-up in 3-4 weeks to determine next steps.   Abigail Butts, PA-C 03/30/20; 1:11 PM

## 2020-03-30 NOTE — ED Provider Notes (Signed)
Care assumed from Dr. Dayna Barker at shift change.  Patient awaiting results of MRI and neurosurgical consultation.  These have both been completed and patient to be admitted to the neurosurgery service.   Veryl Speak, MD 03/30/20 [phone]

## 2020-03-30 NOTE — Progress Notes (Signed)
  Echocardiogram 2D Echocardiogram has been performed.  Jennette Dubin 03/30/2020, 2:31 PM

## 2020-03-30 NOTE — Telephone Encounter (Signed)
Patient is currently admitted-  Try [facility] 07/22

## 2020-03-30 NOTE — Telephone Encounter (Signed)
Per Daleen Snook, set up [facility] visit on 04/28/20 at 11:15 AM with Almyra Deforest

## 2020-03-30 NOTE — Progress Notes (Signed)
No issues or problems overnight.  Patient with some continued mild neck pain.  Continues with severe bilateral upper extremity neuropathic pain.  Able to ambulate and void without difficulty.  Using her hands well.  No obvious weakness.  No cardiac events overnight.  Denies chest pain or shortness of breath.  Physical exam stable.  Status post significant cervical central spinal cord injury secondary to fall with marked cervical spondylosis and stenosis.  Continue IV steroids and oral Neurontin.  Continue efforts at mobilization.  If patient is free from cardiac issues over the next day we may consider discharge home with follow-up in a week or 2.

## 2020-03-31 ENCOUNTER — Telehealth: Payer: Self-pay | Admitting: Cardiovascular Disease

## 2020-03-31 ENCOUNTER — Other Ambulatory Visit: Payer: Self-pay | Admitting: Physician Assistant

## 2020-03-31 DIAGNOSIS — I5032 Chronic diastolic (congestive) heart failure: Secondary | ICD-10-CM

## 2020-03-31 DIAGNOSIS — R55 Syncope and collapse: Secondary | ICD-10-CM

## 2020-03-31 MED ORDER — GABAPENTIN 300 MG PO CAPS: 300.0000 mg | ORAL_CAPSULE | Freq: Three times a day (TID) | ORAL | 0 refills | Status: DC

## 2020-03-31 MED ORDER — FLECAINIDE ACETATE 100 MG PO TABS
100.0000 mg | ORAL_TABLET | Freq: Two times a day (BID) | ORAL | Status: DC
  Administered 2020-03-31 – 2020-04-01 (×3): 100 mg via ORAL
  Filled 2020-03-31 (×4): qty 1

## 2020-03-31 MED ORDER — FLECAINIDE ACETATE 100 MG PO TABS: 100.0000 mg | ORAL_TABLET | Freq: Two times a day (BID) | ORAL | 0 refills | Status: DC

## 2020-03-31 MED ORDER — METHYLPREDNISOLONE 4 MG PO TBPK
ORAL_TABLET | ORAL | 0 refills | Status: DC
Start: 2020-03-31 — End: 2020-04-28

## 2020-03-31 MED ORDER — MAGNESIUM OXIDE 400 (241.3 MG) MG PO TABS
400.0000 mg | ORAL_TABLET | Freq: Once | ORAL | Status: AC
  Administered 2020-03-31: 400 mg via ORAL
  Filled 2020-03-31: qty 1

## 2020-03-31 NOTE — Progress Notes (Signed)
Spoke with EKG tech, 14 day Zio monitor with live monitor will be placed before the patient leave the hospital

## 2020-03-31 NOTE — Progress Notes (Signed)
This RN received a call from the EKG tech that an ordered needed to be placed for the home heart monitor before they could come up and place the monitor. Page sent to cardiology MD to let them know. No response. EKG was called to follow up. No answer. Phone call placed to cardiology office to seek guidance as patient scheduled to be discharged. Receptionist communicated that the representative that works with the monitors was gone for today. Neurosurgery on call paged, and the situation was explained. Instructed to cancel discharge for today.

## 2020-03-31 NOTE — Discharge Instructions (Signed)
You may restart your Eliquis at this time.

## 2020-03-31 NOTE — Discharge Summary (Addendum)
Physician Discharge Summary     Providing Compassionate, Quality Care - Together   Patient ID: Ashlee Flores MRN: [id number] DOB/AGE: 09/24/1940 79 y.o.  Admit date: 03/29/2020 Discharge date: 03/31/2020  Admission Diagnoses: Spinal stenosis of cervical region  Discharge Diagnoses:  Active Problems:   PAF (paroxysmal atrial fibrillation) ([place])   Spinal stenosis of cervical region   Discharged Condition: good  Hospital Course: Ashlee Flores was admitted to 3W26 after presenting to the emergency department following a fall. 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 She also exhibited chronic degenerative changes in her cervical spine on her MRI. She was managed conservatively in a hard cervical collar and with steroids and gabapentin. Her symptoms have improved since admission. A cardiology consult was obtained during this admission and they are recommending a Zio monitor at discharge along with outpatient follow-up. She is ready for discharge home.  Consults: cardiology  Significant Diagnostic Studies: radiology: CT Head Wo Contrast  Result Date: 03/29/2020 CLINICAL DATA:  [place]. Hit head. EXAM: CT HEAD WITHOUT CONTRAST CT MAXILLOFACIAL WITHOUT CONTRAST CT CERVICAL SPINE WITHOUT CONTRAST TECHNIQUE: Multidetector CT imaging of the head, cervical spine, and maxillofacial structures were performed using the standard protocol without intravenous contrast. Multiplanar CT image reconstructions of the cervical spine and maxillofacial structures were also generated. COMPARISON:  CT head and face 12/13/2019 is FINDINGS: CT HEAD FINDINGS Brain: The ventricles are in the midline without mass effect or shift. They are normal in size and configuration for age and degree of cerebral atrophy. No extra-axial fluid collections are identified. The gray-white differentiation is maintained. No CT findings for acute hemispheric infarction or intracranial hemorrhage. No mass  lesions. The brainstem and cerebellum are unremarkable and stable. Vascular: Vascular calcifications but no aneurysm or hyperdense vessels. Skull: No skull fracture or bone lesions. Other: Right frontal scalp hematoma is noted but no radiopaque foreign body or underlying fracture. CT MAXILLOFACIAL FINDINGS Osseous: No acute facial bone fractures are identified. The mandibular condyles are normally located. No mandible fracture. Orbits: No orbital fractures. The globes are intact. No intraorbital hematoma. Sinuses: The paranasal sinuses and mastoid air cells are clear. Soft tissues: Right frontal scalp hematoma. CT CERVICAL SPINE FINDINGS Alignment: Degenerative cervical spondylosis with multilevel disc disease and facet disease. Mild degenerative subluxations but the overall alignment is normal. Skull base and vertebrae: The skull base C1 and C1-2 articulations are maintained. Moderate degenerative changes. Some calcified pannus is noted at C1-2 but no significant mass effect on the upper cervical cord. There is a nondisplaced fracture through the anterior base of the dens. This is likely a flexion teardrop type fracture. No other fractures are identified. The facets are intact. No widening of the posterior disc space. No significant prevertebral hematoma is identified. Severe degenerative disc disease at C4-5, C5-6 and C6-7. Associated discogenic sclerosis and posterior spurring. Soft tissues and spinal canal: No prevertebral fluid or swelling. No visible canal hematoma. 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 Mild to moderate spinal and bilateral foraminal stenosis at C4-5, C5-6 and C6-7. Upper chest: The lung apices are grossly clear. Other: No neck mass or adenopathy. The thyroid gland is grossly normal. IMPRESSION: 1. No acute intracranial findings or skull fracture. 2. Right frontal scalp hematoma but no  radiopaque foreign body or underlying fracture. 3. Nondisplaced fracture through the anterior base of the dens, likely a flexion teardrop type fracture. No other cervical spine fractures  are identified. 4. 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 There is mild to moderate spinal and bilateral foraminal stenosis at C4-5, C5-6 and C6-7. 5. No acute facial bone fractures are identified. Electronically Signed   By: Marijo Sanes M.D.   On: 03/29/2020 07:00   CT Cervical Spine Wo Contrast  Result Date: 03/29/2020 CLINICAL DATA:  [place]. Hit head. EXAM: CT HEAD WITHOUT CONTRAST CT MAXILLOFACIAL WITHOUT CONTRAST CT CERVICAL SPINE WITHOUT CONTRAST TECHNIQUE: Multidetector CT imaging of the head, cervical spine, and maxillofacial structures were performed using the standard protocol without intravenous contrast. Multiplanar CT image reconstructions of the cervical spine and maxillofacial structures were also generated. COMPARISON:  CT head and face 12/13/2019 is FINDINGS: CT HEAD FINDINGS Brain: The ventricles are in the midline without mass effect or shift. They are normal in size and configuration for age and degree of cerebral atrophy. No extra-axial fluid collections are identified. The gray-white differentiation is maintained. No CT findings for acute hemispheric infarction or intracranial hemorrhage. No mass lesions. The brainstem and cerebellum are unremarkable and stable. Vascular: Vascular calcifications but no aneurysm or hyperdense vessels. Skull: No skull fracture or bone lesions. Other: Right frontal scalp hematoma is noted but no radiopaque foreign body or underlying fracture. CT MAXILLOFACIAL FINDINGS Osseous: No acute facial bone fractures are identified. The mandibular condyles are normally located. No mandible fracture. Orbits: No orbital fractures. The globes are intact. No intraorbital hematoma. Sinuses: The paranasal sinuses and mastoid air cells are  clear. Soft tissues: Right frontal scalp hematoma. CT CERVICAL SPINE FINDINGS Alignment: Degenerative cervical spondylosis with multilevel disc disease and facet disease. Mild degenerative subluxations but the overall alignment is normal. Skull base and vertebrae: The skull base C1 and C1-2 articulations are maintained. Moderate degenerative changes. Some calcified pannus is noted at C1-2 but no significant mass effect on the upper cervical cord. There is a nondisplaced fracture through the anterior base of the dens. This is likely a flexion teardrop type fracture. No other fractures are identified. The facets are intact. No widening of the posterior disc space. No significant prevertebral hematoma is identified. Severe degenerative disc disease at C4-5, C5-6 and C6-7. Associated discogenic sclerosis and posterior spurring. Soft tissues and spinal canal: No prevertebral fluid or swelling. No visible canal hematoma. 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 Mild to moderate spinal and bilateral foraminal stenosis at C4-5, C5-6 and C6-7. Upper chest: The lung apices are grossly clear. Other: No neck mass or adenopathy. The thyroid gland is grossly normal. IMPRESSION: 1. No acute intracranial findings or skull fracture. 2. Right frontal scalp hematoma but no radiopaque foreign body or underlying fracture. 3. Nondisplaced fracture through the anterior base of the dens, likely a flexion teardrop type fracture. No other cervical spine fractures are identified. 4. 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 There is mild to moderate spinal and bilateral foraminal stenosis at C4-5, C5-6 and C6-7. 5. No acute facial bone fractures are identified. Electronically Signed   By: Marijo Sanes M.D.   On: 03/29/2020 07:00   MR Cervical Spine Wo Contrast  Result Date:  03/29/2020 CLINICAL DATA:  Fall, fracture EXAM: MRI CERVICAL SPINE WITHOUT CONTRAST TECHNIQUE: Multiplanar, multisequence MR imaging of the cervical spine was performed. No intravenous contrast was administered. COMPARISON:  Correlation made with prior CT FINDINGS: Alignment: There is mild retrolisthesis at C4-C5, C5-C6, C6-C7 on a degenerative  basis. Vertebrae: Oblique nondisplaced fracture through the anterior inferior corner of C2 as seen on CT. Trace associated marrow edema. Marrow signal is mildly heterogeneous likely reflecting osteopenia degenerative endplate changes. Cord: No abnormal signal. Posterior Fossa, vertebral arteries, paraspinal tissues: There is minimal prevertebral fluid at the C4 and C5 levels. Otherwise unremarkable. Disc levels: C2-C3:  Facet hypertrophy.  No significant canal foraminal stenosis. C3-C4: Disc bulge slightly eccentric to right and facet uncovertebral hypertrophy. Mild canal stenosis. Minor right foraminal stenosis. No significant left foraminal stenosis C4-C5: Disc bulge with calcification, endplate osteophytes, and facet and uncovertebral hypertrophy. Moderate canal stenosis. Moderate to marked right and marked left foraminal stenosis. C5-C6: Disc bulge with calcification, endplate osteophytes, and facet and uncovertebral hypertrophy. Marked canal stenosis. Marked foraminal stenosis. C6-C7: Disc bulge with calcification, endplate osteophytes, and facet and uncovertebral hypertrophy. Mild to moderate canal stenosis. Moderate, left greater than right foraminal stenosis. C7-T1: Facet hypertrophy. No significant canal or foraminal stenosis. IMPRESSION: Oblique nondisplaced fracture through the anterior inferior corner of C2 as seen on CT. Minimal prevertebral fluid is present at C4 and C5 levels. No evidence of ligament disruption. Multilevel degenerative changes as detailed above. There is marked canal stenosis at C5-C6. Multilevel foraminal stenosis, greatest from C4-C5 through  C6-C7. Electronically Signed   By: Macy Mis M.D.   On: 03/29/2020 09:02   DG Chest Portable 1 View  Result Date: 03/29/2020 CLINICAL DATA:  [place]. Chest pain. EXAM: PORTABLE CHEST 1 VIEW COMPARISON:  None. FINDINGS: The cardiac silhouette, mediastinal and hilar contours are within normal limits given the AP projection, portable technique and supine position of the patient. The lungs are clear. No pleural effusion or pneumothorax. No worrisome pulmonary lesions. The bony thorax is intact. IMPRESSION: No acute cardiopulmonary findings. Electronically Signed   By: Marijo Sanes M.D.   On: 03/29/2020 06:49   ECHOCARDIOGRAM COMPLETE  Result Date: 03/30/2020    ECHOCARDIOGRAM REPORT   Patient Name:   Ashlee Flores Date of Exam: 03/30/2020 Medical Rec #:  [id number]      Height:       64.0 in Accession #:    [id number]     Weight:       130.0 lb Date of Birth:  [birth year]      BSA:          1.629 m Patient Age:    78 years       BP:           130/72 mmHg Patient Gender: F              HR:           61 bpm. Exam Location:  Inpatient Procedure: 2D Echo Indications:    Syncope R55  History:        Patient has prior history of Echocardiogram examinations, most                 recent 09/17/2016. Arrythmias:Atrial Fibrillation; Risk                 Factors:Hypertension.  Sonographer:    Mikki Santee RDCS (AE) Referring Phys: [id number] [place]  1. Left ventricular ejection fraction, by estimation, is 60 to 65%. The left ventricle has normal function. The left ventricle has no regional wall motion abnormalities. Left ventricular diastolic parameters are consistent with Grade II diastolic dysfunction (pseudonormalization).  2. Right ventricular systolic function is normal. The right ventricular size is normal. There is mildly elevated pulmonary artery systolic  pressure. The estimated right ventricular systolic pressure is 65.7 mmHg.  3. Left atrial size was mildly dilated.  4. The mitral valve is  normal in structure. Trivial mitral valve regurgitation. No evidence of mitral stenosis.  5. Tricuspid valve regurgitation is mild to moderate.  6. The aortic valve is tricuspid. Aortic valve regurgitation is mild. Mild aortic valve sclerosis is present, with no evidence of aortic valve stenosis.  7. The inferior vena cava is dilated in size with <50% respiratory variability, suggesting right atrial pressure of 15 mmHg. FINDINGS  Left Ventricle: Left ventricular ejection fraction, by estimation, is 60 to 65%. The left ventricle has normal function. The left ventricle has no regional wall motion abnormalities. The left ventricular internal cavity size was normal in size. There is  no left ventricular hypertrophy. Left ventricular diastolic parameters are consistent with Grade II diastolic dysfunction (pseudonormalization). Right Ventricle: The right ventricular size is normal. No increase in right ventricular wall thickness. Right ventricular systolic function is normal. There is mildly elevated pulmonary artery systolic pressure. The tricuspid regurgitant velocity is 2.64  m/s, and with an assumed right atrial pressure of 15 mmHg, the estimated right ventricular systolic pressure is 84.6 mmHg. Left Atrium: Left atrial size was mildly dilated. Right Atrium: Right atrial size was normal in size. Pericardium: There is no evidence of pericardial effusion. Mitral Valve: The mitral valve is normal in structure. Trivial mitral valve regurgitation. No evidence of mitral valve stenosis. Tricuspid Valve: The tricuspid valve is normal in structure. Tricuspid valve regurgitation is mild to moderate. Aortic Valve: The aortic valve is tricuspid. Aortic valve regurgitation is mild. Aortic regurgitation PHT measures 360 msec. Mild aortic valve sclerosis is present, with no evidence of aortic valve stenosis. Pulmonic Valve: The pulmonic valve was normal in structure. Pulmonic valve regurgitation is not visualized. Aorta: The aortic  root is normal in size and structure. Venous: The inferior vena cava is dilated in size with less than 50% respiratory variability, suggesting right atrial pressure of 15 mmHg. IAS/Shunts: No atrial level shunt detected by color flow Doppler.  LEFT VENTRICLE PLAX 2D LVIDd:         4.50 cm  Diastology LVIDs:         2.70 cm  LV e' lateral:   11.40 cm/s LV PW:         0.75 cm  LV E/e' lateral: 7.8 LV IVS:        0.75 cm  LV e' medial:    8.27 cm/s LVOT diam:     2.00 cm  LV E/e' medial:  10.8 LV SV:         87 LV SV Index:   54 LVOT Area:     3.14 cm  RIGHT VENTRICLE RV S prime:     15.00 cm/s TAPSE (M-mode): 1.6 cm LEFT ATRIUM             Index       RIGHT ATRIUM           Index LA diam:        3.50 cm 2.15 cm/m  RA Area:     11.20 cm LA Vol (A2C):   36.1 ml 22.16 ml/m RA Volume:   19.80 ml  12.15 ml/m LA Vol (A4C):   30.6 ml 18.78 ml/m LA Biplane Vol: 36.5 ml 22.41 ml/m  AORTIC VALVE LVOT Vmax:   128.00 cm/s LVOT Vmean:  87.500 cm/s LVOT VTI:    0.278 m AI PHT:  360 msec  AORTA Ao Root diam: 2.80 cm MITRAL VALVE               TRICUSPID VALVE MV Area (PHT): 3.53 cm    TV Peak grad:   34.3 mmHg MV Decel Time: 215 msec    TV Vmax:        2.93 m/s MV E velocity: 89.20 cm/s  TR Peak grad:   27.9 mmHg MV A velocity: 65.60 cm/s  TR Vmax:        264.00 cm/s MV E/A ratio:  1.36                            SHUNTS                            Systemic VTI:  0.28 m                            Systemic Diam: 2.00 cm Loralie Champagne MD Electronically signed by Loralie Champagne MD Signature Date/Time: 03/30/2020/5:09:14 PM    Final    CT Maxillofacial Wo Contrast  Result Date: 03/29/2020 CLINICAL DATA:  [place]. Hit head. EXAM: CT HEAD WITHOUT CONTRAST CT MAXILLOFACIAL WITHOUT CONTRAST CT CERVICAL SPINE WITHOUT CONTRAST TECHNIQUE: Multidetector CT imaging of the head, cervical spine, and maxillofacial structures were performed using the standard protocol without intravenous contrast. Multiplanar CT image reconstructions of the  cervical spine and maxillofacial structures were also generated. COMPARISON:  CT head and face 12/13/2019 is FINDINGS: CT HEAD FINDINGS Brain: The ventricles are in the midline without mass effect or shift. They are normal in size and configuration for age and degree of cerebral atrophy. No extra-axial fluid collections are identified. The gray-white differentiation is maintained. No CT findings for acute hemispheric infarction or intracranial hemorrhage. No mass lesions. The brainstem and cerebellum are unremarkable and stable. Vascular: Vascular calcifications but no aneurysm or hyperdense vessels. Skull: No skull fracture or bone lesions. Other: Right frontal scalp hematoma is noted but no radiopaque foreign body or underlying fracture. CT MAXILLOFACIAL FINDINGS Osseous: No acute facial bone fractures are identified. The mandibular condyles are normally located. No mandible fracture. Orbits: No orbital fractures. The globes are intact. No intraorbital hematoma. Sinuses: The paranasal sinuses and mastoid air cells are clear. Soft tissues: Right frontal scalp hematoma. CT CERVICAL SPINE FINDINGS Alignment: Degenerative cervical spondylosis with multilevel disc disease and facet disease. Mild degenerative subluxations but the overall alignment is normal. Skull base and vertebrae: The skull base C1 and C1-2 articulations are maintained. Moderate degenerative changes. Some calcified pannus is noted at C1-2 but no significant mass effect on the upper cervical cord. There is a nondisplaced fracture through the anterior base of the dens. This is likely a flexion teardrop type fracture. No other fractures are identified. The facets are intact. No widening of the posterior disc space. No significant prevertebral hematoma is identified. Severe degenerative disc disease at C4-5, C5-6 and C6-7. Associated discogenic sclerosis and posterior spurring. Soft tissues and spinal canal: No prevertebral fluid or swelling. No visible  canal hematoma. 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 Mild to moderate spinal and bilateral foraminal stenosis at C4-5, C5-6 and C6-7. Upper chest: The lung apices are grossly clear. Other: No neck mass or adenopathy. The thyroid gland is grossly  normal. IMPRESSION: 1. No acute intracranial findings or skull fracture. 2. Right frontal scalp hematoma but no radiopaque foreign body or underlying fracture. 3. Nondisplaced fracture through the anterior base of the dens, likely a flexion teardrop type fracture. No other cervical spine fractures are identified. 4. 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 There is mild to moderate spinal and bilateral foraminal stenosis at C4-5, C5-6 and C6-7. 5. No acute facial bone fractures are identified. Electronically Signed   By: Marijo Sanes M.D.   On: 03/29/2020 07:00    Treatments: steroids: decadron  Discharge Exam: Blood pressure 133/79, pulse 68, temperature 98.1 F (36.7 C), temperature source Oral, resp. rate 18, height 5\' 4"  (1.626 m), weight 59 kg, SpO2 96 %.   Alert and oriented x 4 PERRLA CN II-XII grossly intact MAE, Strength intact Dysesthesia improving in BUE  Disposition: Discharge disposition: 01-Home or Self Care        Allergies as of 03/31/2020      Reactions   Penicillins Anaphylaxis      Medication List    TAKE these medications   acetaminophen 500 MG tablet Commonly known as: TYLENOL Take 500-1,000 mg by mouth every 6 (six) hours as needed for mild pain.   apixaban 5 MG Tabs tablet Commonly known as: Eliquis TAKE 1 TABLET (5 MG TOTAL) BY MOUTH 2 (TWO) TIMES DAILY. What changed:   how much to take  how to take this  when to take this  additional instructions   flecainide 100 MG tablet Commonly known as: TAMBOCOR Take 1 tablet (100 mg total) by mouth every 12  (twelve) hours.   gabapentin 300 MG capsule Commonly known as: NEURONTIN Take 1 capsule (300 mg total) by mouth 3 (three) times daily.   hydrochlorothiazide 25 MG tablet Commonly known as: HYDRODIURIL Take 1 tablet (25 mg total) by mouth daily.   lisinopril 10 MG tablet Commonly known as: ZESTRIL Take 10 mg by mouth daily.   methylPREDNISolone 4 MG Tbpk tablet Commonly known as: MEDROL DOSEPAK Follow instructions included with packet.   metoprolol tartrate 25 MG tablet Commonly known as: LOPRESSOR 1/2 TABLET AS NEEDED AT ONSET OF AFIB, OK TO TAKE ADDITIONAL 1/2 TAB IF HEART RATE STILL ELEVATED AFTER A COUPLE OF HOURS What changed:   how much to take  how to take this  when to take this  reasons to take this  additional instructions   potassium chloride SA 20 MEQ tablet Commonly known as: KLOR-CON Take 1 tablet (20 mEq total) by mouth 2 (two) times daily.       Follow-up Information    Almyra Deforest, [place] Follow up on 04/28/2020.   Specialties: Cardiology, Radiology Why: Please arrive 15 minutes early for your 11:15am post-hospital cardiology follow-up appointment. We should have your heart monitor results back by this visit.  Contact information: [address] [place] [address] [phone]        Earnie Larsson, MD. Schedule an appointment as soon as possible for a visit in 1 week(s).   Specialty: Neurosurgery Contact information: [address] [place] [address] [place] [address] [phone]               Signed: Patricia Nettle 04/01/2020, 11:06 AM

## 2020-03-31 NOTE — Progress Notes (Addendum)
Progress Note  Patient Name: Ashlee Flores Date of Encounter: 03/31/2020  [facility] Cardiologist: Skeet Latch, MD   Subjective   Feeling well.  She was able to get up and wash at the sink today.  The pain in her arms is improving.  She denies any lightheadedness or dizziness.  No recurrent atrial fibrillation since arriving.  Inpatient Medications    Scheduled Meds: . dexamethasone (DECADRON) injection  10 mg Intravenous Q6H  . docusate sodium  100 mg Oral BID  . gabapentin  300 mg Oral TID  . hydrochlorothiazide  25 mg Oral Daily  . lisinopril  10 mg Oral Daily  . potassium chloride SA  20 mEq Oral BID   Continuous Infusions:  PRN Meds: acetaminophen **OR** acetaminophen, bisacodyl, HYDROmorphone (DILAUDID) injection, metoprolol tartrate, ondansetron **OR** ondansetron (ZOFRAN) IV, oxyCODONE, polyethylene glycol   Vital Signs    Vitals:   03/30/20 2041 03/30/20 2350 03/31/20 0353 03/31/20 0815  BP: (!) 153/67 140/83 (!) 150/81 133/79  Pulse: 74 68 68   Resp: 18 18 18    Temp: 98.5 F (36.9 C) 98.8 F (37.1 C) 98.1 F (36.7 C)   TempSrc: Oral Oral Oral   SpO2: 96% 99% 96%   Weight:      Height:        Intake/Output Summary (Last 24 hours) at 03/31/2020 0927 Last data filed at 03/31/2020 0353 Gross per 24 hour  Intake 120 ml  Output 1300 ml  Net -1180 ml   Last 3 Weights 03/29/2020 12/13/2019 08/27/2019  Weight (lbs) 130 lb 125 lb 126 lb  Weight (kg) 58.968 kg 56.7 kg 57.153 kg      Telemetry    Sinus rhythm.  No events.- Personally Reviewed  ECG    n/a - Personally Reviewed  Physical Exam   VS:  BP 133/79   Pulse 68   Temp 98.1 F (36.7 C) (Oral)   Resp 18   Ht 5\' 4"  (1.626 m)   Wt 59 kg   SpO2 96%   BMI 22.31 kg/m  , BMI Body mass index is 22.31 kg/m. GENERAL:  Well appearing.  No acute distress HEENT: Pupils equal round and reactive, fundi not visualized, oral mucosa unremarkable NECK:  C-collar brace in place LUNGS:  Clear to  auscultation bilaterally HEART:  RRR.  PMI not displaced or sustained,S1 and S2 within normal limits, no S3, no S4, no clicks, no rubs, no murmurs ABD:  Flat, positive bowel sounds normal in frequency in pitch, no bruits, no rebound, no guarding, no midline pulsatile mass, no hepatomegaly, no splenomegaly EXT:  2 plus pulses throughout, no edema, no cyanosis no clubbing SKIN:  No rashes no nodules NEURO:  Cranial nerves II through XII grossly intact, motor grossly intact throughout PSYCH:  Cognitively intact, oriented to person place and time   Labs    High Sensitivity Troponin:  No results for input(s): TROPONINIHS in the last 720 hours.    Chemistry Recent Labs  Lab 03/29/20 0559  NA 140  K 3.6  CL 104  CO2 23  GLUCOSE 158*  BUN 16  CREATININE 0.88  CALCIUM 11.1*  PROT 6.7  ALBUMIN 4.2  AST 22  ALT 20  ALKPHOS 61  BILITOT 1.0  GFRNONAA >60  GFRAA >60  ANIONGAP 13     Hematology Recent Labs  Lab 03/29/20 0559  WBC 8.1  RBC 4.80  HGB 14.9  HCT 43.2  MCV 90.0  MCH 31.0  MCHC 34.5  RDW 13.2  PLT 239    BNPNo results for input(s): BNP, PROBNP in the last 168 hours.   DDimer No results for input(s): DDIMER in the last 168 hours.   Radiology    ECHOCARDIOGRAM COMPLETE  Result Date: 03/30/2020    ECHOCARDIOGRAM REPORT   Patient Name:   Ashlee Flores Date of Exam: 03/30/2020 Medical Rec #:  [id number]      Height:       64.0 in Accession #:    [id number]     Weight:       130.0 lb Date of Birth:  06-Feb-1941      BSA:          1.629 m Patient Age:    79 years       BP:           130/72 mmHg Patient Gender: F              HR:           61 bpm. Exam Location:  Inpatient Procedure: 2D Echo Indications:    Syncope R55  History:        Patient has prior history of Echocardiogram examinations, most                 recent 09/17/2016. Arrythmias:Atrial Fibrillation; Risk                 Factors:Hypertension.  Sonographer:    Mikki Santee RDCS (AE) Referring Phys: [id number]  [place]  1. Left ventricular ejection fraction, by estimation, is 60 to 65%. The left ventricle has normal function. The left ventricle has no regional wall motion abnormalities. Left ventricular diastolic parameters are consistent with Grade II diastolic dysfunction (pseudonormalization).  2. Right ventricular systolic function is normal. The right ventricular size is normal. There is mildly elevated pulmonary artery systolic pressure. The estimated right ventricular systolic pressure is 73.7 mmHg.  3. Left atrial size was mildly dilated.  4. The mitral valve is normal in structure. Trivial mitral valve regurgitation. No evidence of mitral stenosis.  5. Tricuspid valve regurgitation is mild to moderate.  6. The aortic valve is tricuspid. Aortic valve regurgitation is mild. Mild aortic valve sclerosis is present, with no evidence of aortic valve stenosis.  7. The inferior vena cava is dilated in size with <50% respiratory variability, suggesting right atrial pressure of 15 mmHg. FINDINGS  Left Ventricle: Left ventricular ejection fraction, by estimation, is 60 to 65%. The left ventricle has normal function. The left ventricle has no regional wall motion abnormalities. The left ventricular internal cavity size was normal in size. There is  no left ventricular hypertrophy. Left ventricular diastolic parameters are consistent with Grade II diastolic dysfunction (pseudonormalization). Right Ventricle: The right ventricular size is normal. No increase in right ventricular wall thickness. Right ventricular systolic function is normal. There is mildly elevated pulmonary artery systolic pressure. The tricuspid regurgitant velocity is 2.64  m/s, and with an assumed right atrial pressure of 15 mmHg, the estimated right ventricular systolic pressure is 10.6 mmHg. Left Atrium: Left atrial size was mildly dilated. Right Atrium: Right atrial size was normal in size. Pericardium: There is no evidence of  pericardial effusion. Mitral Valve: The mitral valve is normal in structure. Trivial mitral valve regurgitation. No evidence of mitral valve stenosis. Tricuspid Valve: The tricuspid valve is normal in structure. Tricuspid valve regurgitation is mild to moderate. Aortic Valve: The aortic valve is tricuspid. Aortic valve regurgitation is mild. Aortic regurgitation PHT measures  360 msec. Mild aortic valve sclerosis is present, with no evidence of aortic valve stenosis. Pulmonic Valve: The pulmonic valve was normal in structure. Pulmonic valve regurgitation is not visualized. Aorta: The aortic root is normal in size and structure. Venous: The inferior vena cava is dilated in size with less than 50% respiratory variability, suggesting right atrial pressure of 15 mmHg. IAS/Shunts: No atrial level shunt detected by color flow Doppler.  LEFT VENTRICLE PLAX 2D LVIDd:         4.50 cm  Diastology LVIDs:         2.70 cm  LV e' lateral:   11.40 cm/s LV PW:         0.75 cm  LV E/e' lateral: 7.8 LV IVS:        0.75 cm  LV e' medial:    8.27 cm/s LVOT diam:     2.00 cm  LV E/e' medial:  10.8 LV SV:         87 LV SV Index:   54 LVOT Area:     3.14 cm  RIGHT VENTRICLE RV S prime:     15.00 cm/s TAPSE (M-mode): 1.6 cm LEFT ATRIUM             Index       RIGHT ATRIUM           Index LA diam:        3.50 cm 2.15 cm/m  RA Area:     11.20 cm LA Vol (A2C):   36.1 ml 22.16 ml/m RA Volume:   19.80 ml  12.15 ml/m LA Vol (A4C):   30.6 ml 18.78 ml/m LA Biplane Vol: 36.5 ml 22.41 ml/m  AORTIC VALVE LVOT Vmax:   128.00 cm/s LVOT Vmean:  87.500 cm/s LVOT VTI:    0.278 m AI PHT:      360 msec  AORTA Ao Root diam: 2.80 cm MITRAL VALVE               TRICUSPID VALVE MV Area (PHT): 3.53 cm    TV Peak grad:   34.3 mmHg MV Decel Time: 215 msec    TV Vmax:        2.93 m/s MV E velocity: 89.20 cm/s  TR Peak grad:   27.9 mmHg MV A velocity: 65.60 cm/s  TR Vmax:        264.00 cm/s MV E/A ratio:  1.36                            SHUNTS                             Systemic VTI:  0.28 m                            Systemic Diam: 2.00 cm Loralie Champagne MD Electronically signed by Loralie Champagne MD Signature Date/Time: 03/30/2020/5:09:14 PM    Final     Cardiac Studies   Echo 03/30/20: 1. Left ventricular ejection fraction, by estimation, is 60 to 65%. The  left ventricle has normal function. The left ventricle has no regional  wall motion abnormalities. Left ventricular diastolic parameters are  consistent with Grade II diastolic  dysfunction (pseudonormalization).  2. Right ventricular systolic function is normal. The right ventricular  size is normal. There is mildly elevated pulmonary artery systolic  pressure.  The estimated right ventricular systolic pressure is 11.5 mmHg.  3. Left atrial size was mildly dilated.  4. The mitral valve is normal in structure. Trivial mitral valve  regurgitation. No evidence of mitral stenosis.  5. Tricuspid valve regurgitation is mild to moderate.  6. The aortic valve is tricuspid. Aortic valve regurgitation is mild.  Mild aortic valve sclerosis is present, with no evidence of aortic valve  stenosis.  7. The inferior vena cava is dilated in size with <50% respiratory  variability, suggesting right atrial pressure of 15 mmHg.   Patient Profile     79 y.o. female with paroxysmal atrial fibrillation, chronic diastolic heart failure and hypertension who presented after a syncopal episode with cervical spine fracture.  Assessment & Plan    # PAF:  # Syncope:  Ms. Furnas had an episode of atrial fibrillation and took metoprolol 12.5mg .  Later the next AM she was no longer in atrial fibrillation and had a syncopal event after going to the bathroom.  She has bradycardia at baseline that was likely worse after taking metoprolol and compounded by standing from sleep and micturition.  She is not currently on cardiac monitoring.  She has been having more frequent atrial fibrillation episodes lately and is not  safe to take metoprolol due to bradycardia.  We will start flecainide 100 mg twice daily.  Supplement magnesium.  We will get a 14-day ambulatory monitor placed today prior to discharge.  She will need to resume Eliquis 5 mg twice daily if she is deemed stable by neurosurgery.  # Chronic diastolic heart failure: Echo this admission revealed LV EF at 60 to 65% with grade 2 diastolic dysfunction.  Right atrial pressure was 15 mmHg.  Clinically she is euvolemic.  Given that she presented with syncope we will not diurese.  She has no shortness of breath or orthostasis and is able to lay flat in bed.  [facility] will sign off.   Medication Recommendations:  Resume Eliquis when cleared by Neurosurgery.  Start flecainide 100mg  bid Other recommendations (labs, testing, etc):  Check EKG at follow up.  Monitor to be placed today prior to discharge Follow up as an outpatient:  We will arrange      For questions or updates, please contact [place] Please consult [email or web address] for contact info under        Signed, Skeet Latch, MD  03/31/2020, 9:27 AM

## 2020-03-31 NOTE — Telephone Encounter (Signed)
Currently admitted.

## 2020-04-01 ENCOUNTER — Ambulatory Visit (INDEPENDENT_AMBULATORY_CARE_PROVIDER_SITE_OTHER): Payer: Medicare Other

## 2020-04-01 DIAGNOSIS — R55 Syncope and collapse: Secondary | ICD-10-CM | POA: Diagnosis not present

## 2020-04-01 NOTE — Progress Notes (Signed)
Zio patch placed onto patient.  All instructions and information reviewed with patient, they verbalize understanding with no questions. 

## 2020-04-01 NOTE — Progress Notes (Signed)
Pt discharge education and instructions completed with pt and spouse at bedside. Both voices understanding and denies any questions, pt IV and telemetry removed. Pt discharge home with spouse to transport her home. Pt to pick up electronically sent prescriptions from preferred pharmacy on file. Pt transported off unit via wheelchair with belongings to the side. Francis Gaines Audriella Blakeley RN.

## 2020-04-01 NOTE — Progress Notes (Signed)
Ambulatory monitor was ordered yesterday at 11:26 am.  Unclear why it was not placed.  We will assist with making sure this is placed today.  Patient is otherwise stable for discharge.  Follow up has been scheduled for 8/19.  Teletha Petrea C. Oval Linsey, MD, [facility] 04/01/2020 10:04 AM

## 2020-04-01 NOTE — Telephone Encounter (Signed)
Patient is still currently admitted to hospital  04/01/20

## 2020-04-02 DIAGNOSIS — R55 Syncope and collapse: Secondary | ICD-10-CM | POA: Diagnosis not present

## 2020-04-04 NOTE — Telephone Encounter (Signed)
TOC call #1 no answer.Unable to leave a message mailbox is full.

## 2020-04-05 NOTE — Telephone Encounter (Signed)
Patient contacted regarding discharge from  on 03/31/20.  Patient understands to follow up with provider  on 04-28-20 at 11:15 am at [facility]. Patient understands discharge instructions? yes Patient understands medications and regiment? yes Patient understands to bring all medications to this visit? yes  Ask patient:  Are you enrolled in My Chart (yes  If no ask patient if they would like to enroll.   Patient reports just feeling tired and sluggish. She does not feel she is in atrial fib. She reports her heart rate is occasionally 49 to 50 bpm.

## 2020-04-20 ENCOUNTER — Telehealth: Payer: Self-pay | Admitting: Cardiovascular Disease

## 2020-04-20 DIAGNOSIS — G959 Disease of spinal cord, unspecified: Secondary | ICD-10-CM | POA: Diagnosis not present

## 2020-04-20 NOTE — Telephone Encounter (Signed)
   Pt c/o BP issue: STAT if pt c/o blurred vision, one-sided weakness or slurred speech  1. What are your last 5 BP readings?  122/70,122/70 HR 57-58 Recent BP 125/54 HR 60  2. Are you having any other symptoms (ex. Dizziness, headache, blurred vision, passed out)? Extremely tired  3. What is your BP issue?Pt said she's been really tired and her BP and HR is low, she doesn't know what to do. She said she's been taking her flecainide as instructed.

## 2020-04-20 NOTE — Telephone Encounter (Signed)
Spoke to patient she stated she felt extremely tired this morning,lasted appox 30 mins.Stated she is feeling better at present.She was concerned B/P low 125/54,122/70,115/64, 110/62 pulse 59 to 60.Advised B/P readings or good.Advised I will make Dr.[place] aware.

## 2020-04-21 NOTE — Telephone Encounter (Signed)
Her BP and heart rte numbers actually look good.  Flecainide can cause dizziness.  Reduced to 50 mg bid and see if that ehlps.

## 2020-04-22 MED ORDER — FLECAINIDE ACETATE 50 MG PO TABS: 50.0000 mg | ORAL_TABLET | Freq: Two times a day (BID) | ORAL | 0 refills | Status: DC

## 2020-04-22 NOTE — Telephone Encounter (Signed)
Pt updated and verbalized understanding. New Rx sent to pharmacy.

## 2020-04-28 ENCOUNTER — Ambulatory Visit (INDEPENDENT_AMBULATORY_CARE_PROVIDER_SITE_OTHER): Payer: Medicare Other | Admitting: Physician Assistant

## 2020-04-28 ENCOUNTER — Encounter: Payer: Self-pay | Admitting: Physician Assistant

## 2020-04-28 ENCOUNTER — Other Ambulatory Visit: Payer: Self-pay

## 2020-04-28 ENCOUNTER — Encounter: Payer: Self-pay | Admitting: Cardiovascular Disease

## 2020-04-28 VITALS — BP 130/60 | HR 49 | Ht 64.0 in | Wt 127.8 lb

## 2020-04-28 DIAGNOSIS — R55 Syncope and collapse: Secondary | ICD-10-CM | POA: Diagnosis not present

## 2020-04-28 DIAGNOSIS — I10 Essential (primary) hypertension: Secondary | ICD-10-CM

## 2020-04-28 DIAGNOSIS — I480 Paroxysmal atrial fibrillation: Secondary | ICD-10-CM

## 2020-04-28 NOTE — Patient Instructions (Signed)
Medication Instructions:  CONTINUE WITH CURRENT MEDICATIONS. NO CHANGES.  *If you need a refill on your cardiac medications before your next appointment, please call your pharmacy*  Testing/Procedures: Your physician has requested that you have a lexiscan myoview. For further information please visit [email or web address]. Please follow instruction sheet, as given.    Follow-Up: At [facility], you and your health needs are our priority.  As part of our continuing mission to provide you with exceptional heart care, we have created designated Provider Care Teams.  These Care Teams include your primary Cardiologist (physician) and Advanced Practice Providers (APPs -  Physician Assistants and Nurse Practitioners) who all work together to provide you with the care you need, when you need it.  We recommend signing up for the patient portal called "MyChart".  Sign up information is provided on this After Visit Summary.  MyChart is used to connect with patients for Virtual Visits (Telemedicine).  Patients are able to view lab/test results, encounter notes, upcoming appointments, etc.  Non-urgent messages can be sent to your provider as well.   To learn more about what you can do with MyChart, go to [email or web address].    Your next appointment:   3 month(s)  The format for your next appointment:   In Person  Provider:   Skeet Latch, MD

## 2020-04-28 NOTE — Progress Notes (Signed)
Cardiology Office Note:    Date:  04/30/2020   ID:  Ashlee Flores, DOB 1941/04/13, MRN [id number]  PCP:  Jonathon Jordan, MD  [facility] Cardiologist:  Skeet Latch, MD  [place] Electrophysiologist:  None   Referring MD: Jonathon Jordan, MD   Chief Complaint  Patient presents with  . Follow-up    seen for Dr. Oval Linsey    History of Present Illness:    Ashlee Flores is a 79 y.o. female with a hx of PAF, grade 2 diastolic dysfunction, hypertension and history of breast cancer.  Patient was diagnosed with atrial fibrillation in November 2017 after wearing a 30-day event monitor.  Thyroid function and basic metabolic panel were normal.  She was started on Eliquis and only take metoprolol as needed due to baseline bradycardia.  Echocardiogram obtained on 09/17/2016 showed EF 55 to 60%, grade 2 DD, mild AI, PASP elevated at 44 mmHg. She was last seen by Dr. Oval Linsey in December 2020 at which time she had a rare recurrence of atrial fibrillation, however each episode does not last very long.  Patient was admitted on 03/29/2020 with a syncopal episode after getting up from the bed overnight to go to the bathroom.  It was suspected that her syncope was multifactorial from bradycardia, orthostasis and the micturition.  She was kept on as needed dose of beta-blocker and recommended a 14-day Zio patch monitor upon discharge.  Antiarrhythmic therapy such as flecainide was also considered.  Repeat echocardiogram during the admission showed EF 60 to 65%, grade 2 DD, right atrial pressure was 15 mmHg.  Clinically, she was euvolemic.  She was not diuresed due to presenting symptom of syncope.  Since she was admitted, patient has been started on flecainide.  However due to extreme fatigue on the medication, flecainide was later reduced to 50 mg twice a day.  Patient presents today along with her husband.  Her heart rate is 49 bpm.  We reviewed the recent heart monitor results, there was no  significant bradycardia, conduction disease, pauses, and ventricular ectopy.  Slowest heart rate was 39 bpm, this occurred during sleep.  There was also 41 bpm heart rate around 7 AM, I am not sure if she was asleep at the time.  However heart monitor was obtained while the patient was on 100 mg twice a day of flecainide, her symptom of dizziness has completely went away after she reduced the dose of flecainide to 50 mg twice a day.  She has no feeling of passing out.  She still has occasional blurred vision however this is not as bad as before.  After talking with Dr. Oval Linsey, we recommended continue her on the current therapy.  We normally would have obtained a plain old treadmill test after flecainide therapy, however with her recent neck fracture, we decided to order a Lexiscan Myoview instead.   Past Medical History:  Diagnosis Date  . Breast cancer ([place])    DCIS breast cancer at 60  . Diastolic dysfunction 11/16/1827  . Essential hypertension 06/04/2016  . Family history of breast cancer   . Family history of ovarian cancer   . Palpitations 06/04/2016  . Paroxysmal atrial fibrillation ([place]) 08/22/2016    Past Surgical History:  Procedure Laterality Date  . BREAST EXCISIONAL BIOPSY Right   . 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 acetaminophen (TYLENOL) 500 MG tablet Take 500-1,000 mg by mouth  every 6 (six) hours as needed for mild pain.  Marland Kitchen apixaban (ELIQUIS) 5 MG TABS tablet TAKE 1 TABLET (5 MG TOTAL) BY MOUTH 2 (TWO) TIMES DAILY.  . flecainide (TAMBOCOR) 50 MG tablet Take 1 tablet (50 mg total) by mouth every 12 (twelve) hours.  . gabapentin (NEURONTIN) 300 MG capsule Take 1 capsule (300 mg total) by mouth 3 (three) times daily. (Patient taking differently: Take 300 mg by mouth daily. )  . hydrochlorothiazide (HYDRODIURIL) 25 MG tablet Take 1 tablet (25 mg total) by mouth daily.  Marland Kitchen lisinopril  (PRINIVIL,ZESTRIL) 10 MG tablet Take 10 mg by mouth daily.   . potassium chloride SA (K-DUR,KLOR-CON) 20 MEQ tablet Take 1 tablet (20 mEq total) by mouth 2 (two) times daily.     Allergies:   Penicillins   Social History   Socioeconomic History  . Marital status: Married    Spouse name: Not on file  . Number of children: 4  . Years of education: Not on file  . Highest education level: Not on file  Occupational History  . Not on file  Tobacco Use  . Smoking status: Never Smoker  . Smokeless tobacco: Never Used  Substance and Sexual Activity  . Alcohol use: Yes  . Drug use: No  . Sexual activity: Not on file  Other Topics Concern  . Not on file  Social History Narrative  . Not on file   Social Determinants of Health   Financial Resource Strain:   . Difficulty of Paying Living Expenses: Not on file  Food Insecurity:   . Worried About Charity fundraiser in the Last Year: Not on file  . Ran Out of Food in the Last Year: Not on file  Transportation Needs:   . Lack of Transportation (Medical): Not on file  . Lack of Transportation (Non-Medical): Not on file  Physical Activity:   . Days of Exercise per Week: Not on file  . Minutes of Exercise per Session: Not on file  Stress:   . Feeling of Stress : Not on file  Social Connections:   . Frequency of Communication with Friends and Family: Not on file  . Frequency of Social Gatherings with Friends and Family: Not on file  . Attends Religious Services: Not on file  . Active Member of Clubs or Organizations: Not on file  . Attends Archivist Meetings: Not on file  . Marital Status: Not on file     Family History: The patient's family history includes Bone cancer in her paternal uncle; Breast cancer in her cousin, maternal grandmother, and paternal aunt; Breast cancer (age of onset: 59) in her maternal aunt; Breast cancer (age of onset: 74) in her mother; Heart attack in her father, maternal aunt, maternal  grandfather, and maternal uncle; Hypertension in her mother; Melanoma (age of onset: 84) in her daughter; Ovarian cancer in her maternal grandmother; Ovarian cancer (age of onset: 42) in her sister; Prostate cancer (age of onset: 41) in her cousin.  ROS:   Please see the history of present illness.     All other systems reviewed and are negative.  EKGs/Labs/Other Studies Reviewed:    The following studies were reviewed today:  Echo 03/30/2020 1. Left ventricular ejection fraction, by estimation, is 60 to 65%. The  left ventricle has normal function. The left ventricle has no regional  wall motion abnormalities. Left ventricular diastolic parameters are  consistent with Grade II diastolic  dysfunction (pseudonormalization).  2. Right ventricular systolic  function is normal. The right ventricular  size is normal. There is mildly elevated pulmonary artery systolic  pressure. The estimated right ventricular systolic pressure is 75.9 mmHg.  3. Left atrial size was mildly dilated.  4. The mitral valve is normal in structure. Trivial mitral valve  regurgitation. No evidence of mitral stenosis.  5. Tricuspid valve regurgitation is mild to moderate.  6. The aortic valve is tricuspid. Aortic valve regurgitation is mild.  Mild aortic valve sclerosis is present, with no evidence of aortic valve  stenosis.  7. The inferior vena cava is dilated in size with <50% respiratory  variability, suggesting right atrial pressure of 15 mmHg.   EKG:  EKG is ordered today.  The ekg ordered today demonstrates sinus bradycardia with HR 49  Recent Labs: 03/29/2020: ALT 20; BUN 16; Creatinine, Ser 0.88; Hemoglobin 14.9; Magnesium 1.6; Platelets 239; Potassium 3.6; Sodium 140  Recent Lipid Panel    Component Value Date/Time   CHOL 189 08/31/2019 1030   TRIG 73 08/31/2019 1030   HDL 84 08/31/2019 1030   CHOLHDL 2.3 08/31/2019 1030   LDLCALC 92 08/31/2019 1030    Physical Exam:    VS:  BP 130/60    Pulse (!) 49   Ht 5\' 4"  (1.626 m)   Wt 127 lb 12.8 oz (58 kg)   SpO2 96%   BMI 21.94 kg/m     Wt Readings from Last 3 Encounters:  04/28/20 127 lb 12.8 oz (58 kg)  03/29/20 130 lb (59 kg)  12/13/19 125 lb (56.7 kg)     GEN:  Well nourished, well developed in no acute distress HEENT: Normal NECK: No JVD; No carotid bruits LYMPHATICS: No lymphadenopathy CARDIAC: RRR, no murmurs, rubs, gallops RESPIRATORY:  Clear to auscultation without rales, wheezing or rhonchi  ABDOMEN: Soft, non-tender, non-distended MUSCULOSKELETAL:  No edema; No deformity  SKIN: Warm and dry NEUROLOGIC:  Alert and oriented x 3 PSYCHIATRIC:  Normal affect   ASSESSMENT:    1. Syncope and collapse   2. PAF (paroxysmal atrial fibrillation) ([place])   3. Essential hypertension    PLAN:    In order of problems listed above:  1. Syncope: Recently was seen in the hospital for syncope, this was felt to be related to bradycardia and recurrence of atrial fibrillation. Recent heart monitor shows no significant bradycardia, the conduction disease, pauses or ventricular ectopy.   2. PAF: Due to concern of recurrent PAF as a potential cause for the recent syncope, she was started on flecainide 100 mg twice daily, however due to side effect of dizziness, flecainide has been reduced to 50 mg twice a day. Dizziness has resolved. I discussed the case with Dr. Oval Linsey, given recent neck fracture, we will forego ETT and instead we will proceed with a Myoview.  3. Hypertension: Blood pressure stable on current therapy.   Medication Adjustments/Labs and Tests Ordered: Current medicines are reviewed at length with the patient today.  Concerns regarding medicines are outlined above.  Orders Placed This Encounter  Procedures  . MYOCARDIAL PERFUSION IMAGING  . EKG 12-Lead   No orders of the defined types were placed in this encounter.   Patient Instructions  Medication Instructions:  CONTINUE WITH CURRENT MEDICATIONS. NO  CHANGES.  *If you need a refill on your cardiac medications before your next appointment, please call your pharmacy*  Testing/Procedures: Your physician has requested that you have a lexiscan myoview. For further information please visit [email or web address]. Please follow instruction sheet, as given.  Follow-Up: At [facility], you and your health needs are our priority.  As part of our continuing mission to provide you with exceptional heart care, we have created designated Provider Care Teams.  These Care Teams include your primary Cardiologist (physician) and Advanced Practice Providers (APPs -  Physician Assistants and Nurse Practitioners) who all work together to provide you with the care you need, when you need it.  We recommend signing up for the patient portal called "MyChart".  Sign up information is provided on this After Visit Summary.  MyChart is used to connect with patients for Virtual Visits (Telemedicine).  Patients are able to view lab/test results, encounter notes, upcoming appointments, etc.  Non-urgent messages can be sent to your provider as well.   To learn more about what you can do with MyChart, go to [email or web address].    Your next appointment:   3 month(s)  The format for your next appointment:   In Person  Provider:   Skeet Latch, MD        Signed, Almyra Deforest, [place]  04/30/2020 11:32 PM    [place]

## 2020-04-29 ENCOUNTER — Telehealth (HOSPITAL_COMMUNITY): Payer: Self-pay

## 2020-04-29 NOTE — Telephone Encounter (Signed)
Encounter complete. 

## 2020-04-30 ENCOUNTER — Encounter: Payer: Self-pay | Admitting: Physician Assistant

## 2020-05-02 ENCOUNTER — Telehealth: Payer: Self-pay | Admitting: Cardiovascular Disease

## 2020-05-02 NOTE — Telephone Encounter (Signed)
°*  STAT* If patient is at the pharmacy, call can be transferred to refill team.   1. Which medications need to be refilled? (please list name of each medication and dose if known)  potassium chloride SA (K-DUR,KLOR-CON) 20 MEQ tablet 2x daily  2. Which pharmacy/location (including street and city if local pharmacy) is medication to be sent to? [id number] - [place],  - [address] AT [place]  3. Do they need a 30 day or 90 day supply? 90 with refills    Pt was put back on this medication during her hospital stay and does not have any meds at home

## 2020-05-03 ENCOUNTER — Other Ambulatory Visit: Payer: Self-pay

## 2020-05-03 MED ORDER — POTASSIUM CHLORIDE CRYS ER 20 MEQ PO TBCR: 20.0000 meq | EXTENDED_RELEASE_TABLET | Freq: Two times a day (BID) | ORAL | 3 refills | Status: DC

## 2020-05-04 ENCOUNTER — Other Ambulatory Visit: Payer: Self-pay

## 2020-05-04 ENCOUNTER — Ambulatory Visit (HOSPITAL_COMMUNITY)
Admission: RE | Admit: 2020-05-04 | Discharge: 2020-05-04 | Disposition: A | Discharge status: Home / Self Care | Payer: Medicare Other | Source: Ambulatory Visit | Attending: Internal Medicine | Admitting: Internal Medicine

## 2020-05-04 DIAGNOSIS — R55 Syncope and collapse: Secondary | ICD-10-CM | POA: Diagnosis not present

## 2020-05-04 LAB — MYOCARDIAL PERFUSION IMAGING
LV dias vol: 80 mL (ref 46–106)
LV sys vol: 30 mL
Peak HR: 75 {beats}/min
Rest HR: 50 {beats}/min
SDS: 2
SRS: 1
SSS: 3
TID: 1.02

## 2020-05-04 MED ORDER — TECHNETIUM TC 99M TETROFOSMIN IV KIT
10.7000 | PACK | Freq: Once | INTRAVENOUS | Status: AC | PRN
  Administered 2020-05-04: 10.7 via INTRAVENOUS
  Filled 2020-05-04: qty 11

## 2020-05-04 MED ORDER — REGADENOSON 0.4 MG/5ML IV SOLN
0.4000 mg | Freq: Once | INTRAVENOUS | Status: AC
  Administered 2020-05-04: 0.4 mg via INTRAVENOUS

## 2020-05-04 MED ORDER — TECHNETIUM TC 99M TETROFOSMIN IV KIT
30.0000 | PACK | Freq: Once | INTRAVENOUS | Status: AC | PRN
  Administered 2020-05-04: 30 via INTRAVENOUS
  Filled 2020-05-04: qty 30

## 2020-05-19 DIAGNOSIS — G959 Disease of spinal cord, unspecified: Secondary | ICD-10-CM | POA: Diagnosis not present

## 2020-05-20 ENCOUNTER — Other Ambulatory Visit: Payer: Self-pay | Admitting: Cardiovascular Disease

## 2020-05-23 ENCOUNTER — Telehealth: Payer: Self-pay

## 2020-05-23 NOTE — Telephone Encounter (Signed)
Patient with diagnosis of afib on Eliquis for anticoagulation.    Procedure: cervical fusion Date of procedure: TBD  CHADS2-VASc score of 5 (age x2, sex, CHF, HTN)  CrCl 42mL/min Platelet count 239K  Per office protocol, patient can hold Eliquis for 3 days prior to procedure per protocol.

## 2020-05-23 NOTE — Telephone Encounter (Signed)
   [facility] Pre-operative Risk Assessment    HEARTCARE STAFF: - Please ensure there is not already an duplicate clearance open for this procedure. - Under Visit Info/Reason for Call, type in Other and utilize the format Clearance MM/DD/YY or Clearance TBD. Do not use dashes or single digits. - If request is for dental extraction, please clarify the # of teeth to be extracted.  Request for surgical clearance:  1. What type of surgery is being performed? C4-5, C5-6, C6-7 anterior cervical fusion  2. When is this surgery scheduled? tbd  3. What type of clearance is required (medical clearance vs. Pharmacy clearance to hold med vs. Both)? both  4. Are there any medications that need to be held prior to surgery and how long? Not listed  5. Practice name and name of physician performing surgery? [facility]- Sherley Bounds MD  6. What is the office phone number? [phone]   7.   What is the office fax number? [phone]  8.   Anesthesia type (None, local, MAC, general) ? general   Ashlee Flores 05/23/2020, 10:37 AM  _________________________________________________________________   (provider comments below)

## 2020-05-23 NOTE — Telephone Encounter (Signed)
Clinical pharmacist to review Eliquis 

## 2020-05-24 DIAGNOSIS — Z23 Encounter for immunization: Secondary | ICD-10-CM | POA: Diagnosis not present

## 2020-05-24 NOTE — Telephone Encounter (Signed)
Will fax back to requesting surgeons office to make them aware and remove from pre op pool.

## 2020-05-24 NOTE — Telephone Encounter (Signed)
I plan to see the patient next Monday and clear her during the office visit. Will remove this request from preop pool.

## 2020-05-24 NOTE — Telephone Encounter (Signed)
Patient stated that she forgot that she would not be in town on Monday. I have scheduled her in a 72hour slot, okay per Almyra Deforest, 06/01/20 at 10:45AM.

## 2020-05-24 NOTE — Telephone Encounter (Signed)
Ashlee Flores is well known to me and Dr. Oval Linsey. Her recent myoview was reassuring. She should be cleared for surgery. However she says she has been able to hear her heart beat at night and want to make sure she is not going into any irregular rhythm. I offered her a office visit next Monday, we will address cardiac clearance at that time. During the mean time, she will keep some cardiac strips on her apple watch.

## 2020-05-30 ENCOUNTER — Telehealth: Payer: Self-pay | Admitting: Physician Assistant

## 2020-05-30 NOTE — Telephone Encounter (Signed)
Called patient- she states that she has been in AFIB throughout the night and woke her up this morning. She normally does not have an episode last this long.  She denies dizziness, passing out, chest pain, SOB, changes in medications.  She has taken her Flecainide it was recently reduced to 50 mg every 12 hours- last taken this morning at 10:00 AM.  Patient states that 8 minutes ago was 69, and then when she checked with me a moment ago it was 140.  She does mention having some burping issues (which she normally has with her episodes), and some small bits of lightheadedness.  She is drinking plenty of water- but just wants to make sure she is okay to wait until her appointment on 09/22.  Spoke with DOD- she states as long as patient is okay-having no symptoms she is okay to wait until appointment Patient is aware.

## 2020-05-30 NOTE — Telephone Encounter (Signed)
New message  Patient c/o Palpitations:  High priority if patient c/o lightheadedness, shortness of breath, or chest pain  1) How long have you had palpitations/irregular HR/ Afib? Are you having the symptoms now? Since 10 am this morning   2) Are you currently experiencing lightheadedness, SOB or CP? No   3) Do you have a history of afib (atrial fibrillation) or irregular heart rhythm? Yes   Have you checked your BP or HR? (document readings if available):   no BP  Heart rate was 132 , it was up to 160 at one point this morning  4) Are you experiencing any other symptoms? no   Pt would like to know if she needs to be seen today due to these symptoms  , she has an appt with  Almyra Deforest on wed 9/22.  Please advice

## 2020-06-01 ENCOUNTER — Emergency Department (HOSPITAL_COMMUNITY): Payer: Medicare Other

## 2020-06-01 ENCOUNTER — Other Ambulatory Visit: Payer: Self-pay

## 2020-06-01 ENCOUNTER — Encounter: Payer: Self-pay | Admitting: Physician Assistant

## 2020-06-01 ENCOUNTER — Emergency Department (HOSPITAL_COMMUNITY)
Admission: EM | Admit: 2020-06-01 | Discharge: 2020-06-01 | Disposition: A | Discharge status: Home / Self Care | Payer: Medicare Other | Attending: Emergency Medicine | Admitting: Emergency Medicine

## 2020-06-01 ENCOUNTER — Ambulatory Visit (INDEPENDENT_AMBULATORY_CARE_PROVIDER_SITE_OTHER): Payer: Medicare Other | Admitting: Physician Assistant

## 2020-06-01 VITALS — BP 124/68 | HR 56 | Ht 62.0 in | Wt 127.6 lb

## 2020-06-01 DIAGNOSIS — Z79899 Other long term (current) drug therapy: Secondary | ICD-10-CM | POA: Diagnosis not present

## 2020-06-01 DIAGNOSIS — I10 Essential (primary) hypertension: Secondary | ICD-10-CM

## 2020-06-01 DIAGNOSIS — Z7901 Long term (current) use of anticoagulants: Secondary | ICD-10-CM | POA: Insufficient documentation

## 2020-06-01 DIAGNOSIS — Z01818 Encounter for other preprocedural examination: Secondary | ICD-10-CM

## 2020-06-01 DIAGNOSIS — I110 Hypertensive heart disease with heart failure: Secondary | ICD-10-CM | POA: Insufficient documentation

## 2020-06-01 DIAGNOSIS — Z853 Personal history of malignant neoplasm of breast: Secondary | ICD-10-CM | POA: Diagnosis not present

## 2020-06-01 DIAGNOSIS — I480 Paroxysmal atrial fibrillation: Secondary | ICD-10-CM

## 2020-06-01 DIAGNOSIS — I4891 Unspecified atrial fibrillation: Secondary | ICD-10-CM | POA: Diagnosis not present

## 2020-06-01 DIAGNOSIS — R531 Weakness: Secondary | ICD-10-CM | POA: Diagnosis not present

## 2020-06-01 DIAGNOSIS — I5030 Unspecified diastolic (congestive) heart failure: Secondary | ICD-10-CM | POA: Insufficient documentation

## 2020-06-01 DIAGNOSIS — E876 Hypokalemia: Secondary | ICD-10-CM | POA: Diagnosis not present

## 2020-06-01 LAB — BASIC METABOLIC PANEL
Anion gap: 14 (ref 5–15)
BUN: 10 mg/dL (ref 8–23)
CO2: 22 mmol/L (ref 22–32)
Calcium: 10.2 mg/dL (ref 8.9–10.3)
Chloride: 102 mmol/L (ref 98–111)
Creatinine, Ser: 0.76 mg/dL (ref 0.44–1.00)
GFR calc Af Amer: >60 mL/min (ref >60)
GFR calc non Af Amer: >60 mL/min (ref >60)
Glucose, Bld: 134 mg/dL — ABNORMAL HIGH (ref 70–99)
Potassium: 3.2 mmol/L — ABNORMAL LOW (ref 3.5–5.1)
Sodium: 138 mmol/L (ref 135–145)

## 2020-06-01 LAB — CBC
HCT: 41.7 % (ref 36.0–46.0)
Hemoglobin: 14.5 g/dL (ref 12.0–15.0)
MCH: 31.2 pg (ref 26.0–34.0)
MCHC: 34.8 g/dL (ref 30.0–36.0)
MCV: 89.7 fL (ref 80.0–100.0)
Platelets: 212 10*3/uL (ref 150–400)
RBC: 4.65 MIL/uL (ref 3.87–5.11)
RDW: 13.2 % (ref 11.5–15.5)
WBC: 5.3 10*3/uL (ref 4.0–10.5)
nRBC: 0 % (ref 0.0–0.2)

## 2020-06-01 MED ORDER — POTASSIUM CHLORIDE CRYS ER 20 MEQ PO TBCR
40.0000 meq | EXTENDED_RELEASE_TABLET | Freq: Once | ORAL | Status: AC
  Administered 2020-06-01: 40 meq via ORAL
  Filled 2020-06-01: qty 2

## 2020-06-01 MED ORDER — DILTIAZEM LOAD VIA INFUSION
10.0000 mg | Freq: Once | INTRAVENOUS | Status: DC
  Filled 2020-06-01: qty 10

## 2020-06-01 MED ORDER — FLECAINIDE ACETATE 100 MG PO TABS
300.0000 mg | ORAL_TABLET | Freq: Once | ORAL | Status: DC
  Filled 2020-06-01: qty 3

## 2020-06-01 MED ORDER — DILTIAZEM HCL-DEXTROSE 125-5 MG/125ML-% IV SOLN (PREMIX)
5.0000 mg/h | INTRAVENOUS | Status: DC
  Filled 2020-06-01: qty 125

## 2020-06-01 NOTE — ED Notes (Signed)
Pt refusing covid test at this time ,RN will continue to monitor

## 2020-06-01 NOTE — Progress Notes (Signed)
Cardiology Office Note:    Date:  06/03/2020   ID:  Ashlee Flores, DOB Dec 29, 1940, MRN [id number]  PCP:  Jonathon Jordan, MD  [facility] Cardiologist:  Skeet Latch, MD  [place] Electrophysiologist:  None   Referring MD: Jonathon Jordan, MD   Chief Complaint  Patient presents with  . Follow-up    seen for Dr. Oval Linsey    History of Present Illness:    Ashlee Flores is a 79 y.o. female with a hx of PAF, grade 2 diastolic dysfunction, hypertension and history of breast cancer.  Patient was diagnosed with atrial fibrillation in November 2017 after wearing a 30-day event monitor.  Thyroid function and basic metabolic panel were normal.  She was started on Eliquis and only take metoprolol as needed due to baseline bradycardia.  Echocardiogram obtained on 09/17/2016 showed EF 55 to 60%, grade 2 DD, mild AI, PASP elevated at 44 mmHg. She was last seen by Dr. Oval Linsey in December 2020 at which time she had a rare recurrence of atrial fibrillation, however each episode does not last very long.  Patient was admitted on 03/29/2020 with a syncopal episode after getting up from the bed overnight to go to the bathroom.  It was suspected that her syncope was multifactorial from bradycardia, orthostasis and micturition.  She was kept on as needed dose of beta-blocker and recommended a 14-day Zio patch monitor upon discharge.  Antiarrhythmic therapy such as flecainide was also considered.  Repeat echocardiogram during the admission showed EF 60 to 65%, grade 2 DD, right atrial pressure was 15 mmHg.  Clinically, she was euvolemic.  She was not diuresed due to presenting symptom of syncope.  Since she was admitted, patient has been started on flecainide.  However due to extreme fatigue on the medication, flecainide was later reduced to 50 mg twice a day.  Subsequent Myoview were normal.  We were unable to do plain old treadmill test due to recent neck fracture.   Patient went to the ER last night due  to breakthrough A. fib.  She converted out of A. fib by herself before given any diltiazem or 300 mg of flecainide as instructed by the cardiologist on-call.  We discussed various strategies, for the time being, I decided to leave her on 50 mg twice a day of flecainide with instruction of taking extra 50 mg on as-needed basis for recurrent breakthrough A. fib.  If she continues to have atrial fibrillation multiple times throughout the week, next strategy would be increased the flecainide to 50 mg 3 times a day with extra dose of flecainide as needed.  We talked about amiodarone and Tikosyn, she preferred to hold off on that.  She cannot tolerate the previous 100 mg twice a day of the flecainide due to side effect.  I recommended refer her to the A. fib clinic for follow-up.  At some point, she may ended up needing a atrial fibrillation ablation.  As far as her back surgery, I think she should be cleared to proceed with surgery given the normal Myoview recently.  She will need to hold Eliquis for 3 days prior to the surgery and restart as soon as possible afterward.  She preferred to consider the surgery after she has been seen in the A. fib clinic in order to make sure her A. fib stabilizes prior to the procedure.  Past Medical History:  Diagnosis Date  . 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 Diastolic dysfunction  10/12/2016  . Essential hypertension 06/04/2016  . Family history of breast cancer   . Family history of ovarian cancer   . Palpitations 06/04/2016  . Paroxysmal atrial fibrillation ([place]) 08/22/2016    Past Surgical History:  Procedure Laterality Date  . BREAST EXCISIONAL BIOPSY Right   . SALPINGOOPHORECTOMY     preventative, due to [place] of ovarian cancer    Current Medications: Current Meds  Medication Sig  . acetaminophen (TYLENOL) 500 MG tablet Take 500-1,000 mg by mouth every 6 (six) hours as needed for mild pain.  Marland Kitchen apixaban (ELIQUIS) 5 MG TABS tablet TAKE 1 TABLET (5  MG TOTAL) BY MOUTH 2 (TWO) TIMES DAILY.  . flecainide (TAMBOCOR) 50 MG tablet TAKE 1 TABLET (50 MG TOTAL) BY MOUTH EVERY 12 (TWELVE) HOURS.  . hydrochlorothiazide (HYDRODIURIL) 25 MG tablet Take 1 tablet (25 mg total) by mouth daily.  Marland Kitchen lisinopril (PRINIVIL,ZESTRIL) 10 MG tablet Take 10 mg by mouth daily.   . potassium chloride SA (KLOR-CON) 20 MEQ tablet Take 1 tablet (20 mEq total) by mouth 2 (two) times daily.     Allergies:   Penicillins   Social History   Socioeconomic History  . Marital status: Married    Spouse name: Not on file  . Number of children: 4  . Years of education: Not on file  . Highest education level: Not on file  Occupational History  . Not on file  Tobacco Use  . Smoking status: Never Smoker  . Smokeless tobacco: Never Used  Substance and Sexual Activity  . Alcohol use: Yes  . Drug use: No  . Sexual activity: Not on file  Other Topics Concern  . Not on file  Social History Narrative  . Not on file   Social Determinants of Health   Financial Resource Strain:   . Difficulty of Paying Living Expenses: Not on file  Food Insecurity:   . Worried About Charity fundraiser in the Last Year: Not on file  . Ran Out of Food in the Last Year: Not on file  Transportation Needs:   . Lack of Transportation (Medical): Not on file  . Lack of Transportation (Non-Medical): Not on file  Physical Activity:   . Days of Exercise per Week: Not on file  . Minutes of Exercise per Session: Not on file  Stress:   . Feeling of Stress : Not on file  Social Connections:   . Frequency of Communication with Friends and Family: Not on file  . Frequency of Social Gatherings with Friends and Family: Not on file  . Attends Religious Services: Not on file  . Active Member of Clubs or Organizations: Not on file  . Attends Archivist Meetings: Not on file  . Marital Status: Not on file     Family History: The patient's family history includes Bone cancer in her  paternal uncle; Breast cancer in her cousin, maternal grandmother, and paternal aunt; Breast cancer (age of onset: 25) in her maternal aunt; Breast cancer (age of onset: 31) in her mother; Heart attack in her father, maternal aunt, maternal grandfather, and maternal uncle; Hypertension in her mother; Melanoma (age of onset: 52) in her daughter; Ovarian cancer in her maternal grandmother; Ovarian cancer (age of onset: 88) in her sister; Prostate cancer (age of onset: 16) in her cousin.  ROS:   Please see the history of present illness.     All other systems reviewed and are negative.  EKGs/Labs/Other Studies Reviewed:  The following studies were reviewed today:  Echo 03/30/2020 1. Left ventricular ejection fraction, by estimation, is 60 to 65%. The  left ventricle has normal function. The left ventricle has no regional  wall motion abnormalities. Left ventricular diastolic parameters are  consistent with Grade II diastolic  dysfunction (pseudonormalization).  2. Right ventricular systolic function is normal. The right ventricular  size is normal. There is mildly elevated pulmonary artery systolic  pressure. The estimated right ventricular systolic pressure is 52.8 mmHg.  3. Left atrial size was mildly dilated.  4. The mitral valve is normal in structure. Trivial mitral valve  regurgitation. No evidence of mitral stenosis.  5. Tricuspid valve regurgitation is mild to moderate.  6. The aortic valve is tricuspid. Aortic valve regurgitation is mild.  Mild aortic valve sclerosis is present, with no evidence of aortic valve  stenosis.  7. The inferior vena cava is dilated in size with <50% respiratory  variability, suggesting right atrial pressure of 15 mmHg.    Myoview 05/04/2020  The left ventricular ejection fraction is normal (55-65%).  Nuclear stress EF: 62%.  There was no ST segment deviation noted during stress.  The study is normal.  This is a low risk study.   EKG:   EKG is ordered today.  The ekg ordered today demonstrates sinus bradycardia, no significant ST-T wave changes  Recent Labs: 03/29/2020: ALT 20; Magnesium 1.6 06/01/2020: BUN 10; Creatinine, Ser 0.76; Hemoglobin 14.5; Platelets 212; Potassium 3.2; Sodium 138  Recent Lipid Panel    Component Value Date/Time   CHOL 189 08/31/2019 1030   TRIG 73 08/31/2019 1030   HDL 84 08/31/2019 1030   CHOLHDL 2.3 08/31/2019 1030   LDLCALC 92 08/31/2019 1030    Physical Exam:    VS:  BP 124/68   Pulse (!) 56   Ht 5\' 2"  (1.575 m)   Wt 127 lb 9.6 oz (57.9 kg)   BMI 23.34 kg/m     Wt Readings from Last 3 Encounters:  06/01/20 127 lb 9.6 oz (57.9 kg)  05/04/20 127 lb (57.6 kg)  04/28/20 127 lb 12.8 oz (58 kg)     GEN:  Well nourished, well developed in no acute distress HEENT: Normal NECK: No JVD; No carotid bruits LYMPHATICS: No lymphadenopathy CARDIAC: RRR, no murmurs, rubs, gallops RESPIRATORY:  Clear to auscultation without rales, wheezing or rhonchi  ABDOMEN: Soft, non-tender, non-distended MUSCULOSKELETAL:  No edema; No deformity  SKIN: Warm and dry NEUROLOGIC:  Alert and oriented x 3 PSYCHIATRIC:  Normal affect   ASSESSMENT:    1. PAF (paroxysmal atrial fibrillation) ([place])   2. Pre-op evaluation   3. Essential hypertension    PLAN:    In order of problems listed above:  1. PAF: On Eliquis and flecainide.  Beta-blocker was previously discontinued due to possible symptomatic bradycardia.  I recommended continue on flecainide 50 mg twice a day with the instruction of taking extra 50 mg on a as needed basis.  If atrial fibrillation continue to occur, I would recommend increase flecainide to 50 mg 3 times a day with extra dose as needed.  We talked today regarding amiodarone and Tikosyn as alternative to flecainide, she wished to hold off on those.  She also wished to delay her back surgery until her A. fib is more stabilized.  I will refer her to the A. fib clinic for continued  management of atrial fibrillation.  2. Preoperative clearance: She has upcoming back surgery.  She will need to hold her  Eliquis for 3 days prior to the surgery.  Recent Myoview was reassuring, therefore she should be able to proceed with back surgery.  However she wished to see atrial fibrillation clinic first and make sure her A. fib is stabilized before proceeding with back surgery.  3. Hypertension: Blood pressure stable on current therapy   Medication Adjustments/Labs and Tests Ordered: Current medicines are reviewed at length with the patient today.  Concerns regarding medicines are outlined above.  Orders Placed This Encounter  Procedures  . Amb Referral to AFIB Clinic  . EKG 12-Lead   No orders of the defined types were placed in this encounter.   Patient Instructions  Medication Instructions:   You may take an extra of Flecainide for break through Afib.  *If you need a refill on your cardiac medications before your next appointment, please call your pharmacy*  Lab Work: NONE ordered at this time of appointment   If you have labs (blood work) drawn today and your tests are completely normal, you will receive your results only by: Marland Kitchen MyChart Message (if you have MyChart) OR . A paper copy in the mail If you have any lab test that is abnormal or we need to change your treatment, we will call you to review the results.  Testing/Procedures: You have been referred to Afib clinic   Follow-Up: At [facility], you and your health needs are our priority.  As part of our continuing mission to provide you with exceptional heart care, we have created designated Provider Care Teams.  These Care Teams include your primary Cardiologist (physician) and Advanced Practice Providers (APPs -  Physician Assistants and Nurse Practitioners) who all work together to provide you with the care you need, when you need it.  Your next appointment:   Follow up as scheduled   The format for your  next appointment:   In Person  Provider:   Skeet Latch, MD  Other Instructions      Signed, Almyra Deforest, [place]  06/03/2020 7:37 PM    [place]

## 2020-06-01 NOTE — ED Provider Notes (Signed)
[facility] EMERGENCY DEPARTMENT Provider Note   CSN: [id number] Arrival date & time: 06/01/20  0419   History Chief Complaint  Patient presents with  . Atrial Fibrillation  . Weakness    Ashlee Ashlee Flores is a 79 y.o. female.  The history is provided by the patient.  Atrial Fibrillation  Weakness She has history of Ashlee Flores, Ashlee atrial fibrillation anticoagulated on apixaban, diastolic heart failure and comes in because of recurrent atrial fibrillation.  She states that about 5 days ago, she had an episode of atrial fibrillation which lasted most of the day.  She states that episodes usually last only about 1 hour.  This morning, she was awakened at 3 AM by rapid heartbeat.  She states that her blood pressure was very high.  There is no chest pain, heaviness, tightness, pressure.  She denies dyspnea, nausea, diaphoresis.  She has been compliant with her medications including apixaban.   Past Medical History:  Diagnosis Date  . Breast cancer ([place])    DCIS breast cancer at 52  . Diastolic dysfunction 01/14/4331  . Essential Ashlee Flores 06/04/2016  . Family history of breast cancer   . Family history of ovarian cancer   . Palpitations 06/04/2016  . Ashlee atrial fibrillation ([place]) 08/22/2016    Patient Active Problem List   Diagnosis Date Noted  . Spinal stenosis of cervical region 03/29/2020  . Syncope and collapse   . Diastolic dysfunction 95/18/8416  . PAF (Ashlee atrial fibrillation) ([place]) 08/22/2016  . Essential Ashlee Flores 06/04/2016  . Palpitations 06/04/2016  . Genetic testing 01/21/2015  . Breast cancer ([place])   . Family history of ovarian cancer   . Family history of breast cancer     Past Surgical History:  Procedure Laterality Date  . BREAST EXCISIONAL BIOPSY Right   . SALPINGOOPHORECTOMY     preventative, due to [place] of ovarian cancer     OB History   No obstetric history on file.     Family History  Problem  Relation Age of Onset  . Breast cancer Mother 61  . Ashlee Flores Mother   . Heart attack Father   . Ovarian cancer Sister 10  . Breast cancer Paternal Aunt        dx in her 82s  . Ovarian cancer Maternal Grandmother        died at 37  . Breast cancer Maternal Grandmother   . Melanoma Daughter 32       on leg  . Breast cancer Cousin        dx in her 28s  . Breast cancer Maternal Aunt 81  . Heart attack Maternal Uncle   . Bone cancer Paternal Uncle   . Heart attack Maternal Grandfather   . Heart attack Maternal Aunt   . Prostate cancer Cousin 41    Social History   Tobacco Use  . Smoking status: Never Smoker  . Smokeless tobacco: Never Used  Substance Use Topics  . Alcohol use: Yes  . Drug use: No    Home Medications Prior to Admission medications   Medication Sig Start Date End Date Taking? Authorizing Provider  acetaminophen (TYLENOL) 500 MG tablet Take 500-1,000 mg by mouth every 6 (six) hours as needed for mild pain.    [provider]  apixaban (ELIQUIS) 5 MG TABS tablet TAKE 1 TABLET (5 MG TOTAL) BY MOUTH 2 (TWO) TIMES DAILY. 08/27/19   Skeet Latch, MD  flecainide (TAMBOCOR) 50 MG tablet TAKE 1 TABLET (50 MG TOTAL)  BY MOUTH EVERY 12 (TWELVE) HOURS. 05/20/20   Skeet Latch, MD  gabapentin (NEURONTIN) 300 MG capsule Take 1 capsule (300 mg total) by mouth 3 (three) times daily. Patient taking differently: Take 300 mg by mouth daily.  03/31/20   Viona Gilmore D, NP  hydrochlorothiazide (HYDRODIURIL) 25 MG tablet Take 1 tablet (25 mg total) by mouth daily. 04/17/19   Skeet Latch, MD  lisinopril (PRINIVIL,ZESTRIL) 10 MG tablet Take 10 mg by mouth daily.  05/20/16   [provider]  potassium chloride SA (KLOR-CON) 20 MEQ tablet Take 1 tablet (20 mEq total) by mouth 2 (two) times daily. 05/03/20   Almyra Deforest, PA    Allergies    Penicillins  Review of Systems   Review of Systems  Neurological: Positive for weakness.  All other systems  reviewed and are negative.   Physical Exam Updated Vital Signs BP (!) 158/116 (BP Location: Right Arm)   Pulse (!) 124   Temp 97.8 F (36.6 C) (Oral)   Resp 16   SpO2 100%   Physical Exam Vitals and nursing note reviewed.   79 year old female, resting comfortably and in no acute distress. Vital signs are significant for rapid heart rate and elevated blood pressure. Oxygen saturation is 100%, which is normal. Head is normocephalic and atraumatic. PERRLA, EOMI. Oropharynx is clear. Neck is nontender and supple without adenopathy or JVD. Back is nontender and there is no CVA tenderness. Lungs are clear without rales, wheezes, or rhonchi. Chest is nontender. Heart is tachycardic and irregular without murmur. Abdomen is soft, flat, nontender without masses or hepatosplenomegaly and peristalsis is normoactive. Extremities have no cyanosis or edema, full range of motion is present. Skin is warm and dry without rash. Neurologic: Mental status is normal, cranial nerves are intact, there are no motor or sensory deficits.  ED Results / Procedures / Treatments   Labs (all labs ordered are listed, but only abnormal results are displayed) Labs Reviewed  BASIC METABOLIC PANEL - Abnormal; Notable for the following components:      Result Value   Potassium 3.2 (*)    Glucose, Bld 134 (*)    All other components within normal limits  SARS CORONAVIRUS 2 BY RT PCR [facility] ORDER, [place] LAB)  CBC    EKG EKG Interpretation  Date/Time:  Wednesday June 01 2020 04:25:22 EDT Ventricular Rate:  128 PR Interval:    QRS Duration: 86 QT Interval:  318 QTC Calculation: 464 R Axis:   77 Text Interpretation: Atrial flutter with variable A-V block Nonspecific ST and T wave abnormality Abnormal ECG When compared with ECG of 03/29/2020, Atrial flutter has replaced Sinus rhythm HEART RATE has increased Confirmed by Delora Fuel ([id number]) on 06/01/2020 4:30:03 AM   EKG  Interpretation  Date/Time:  Wednesday June 01 2020 05:28:27 EDT Ventricular Rate:  129 PR Interval:    QRS Duration: 103 QT Interval:  294 QTC Calculation: 431 R Axis:   88 Text Interpretation: Atrial fibrillation Borderline right axis deviation Nonspecific repol abnormality, diffuse leads When compared with ECG of EARLIER SAME DATE No significant change was found Confirmed by Delora Fuel ([id number]) on 06/01/2020 5:55:27 AM       EKG Interpretation  Date/Time:  Wednesday June 01 2020 05:43:03 EDT Ventricular Rate:  58 PR Interval:    QRS Duration: 144 QT Interval:  425 QTC Calculation: 418 R Axis:   103 Text Interpretation: Sinus rhythm Nonspecific intraventricular conduction delay Borderline ST depression, diffuse leads  When compared with ECG of EARLIER SAME DATE Sinus rhythm has replaced Atrial fibrillation with rapid ventricular response Confirmed by Delora Fuel ([id number]) on 06/01/2020 5:56:29 AM       Radiology DG Chest 2 View  Result Date: 06/01/2020 CLINICAL DATA:  Weakness, atrial fibrillation EXAM: CHEST - 2 VIEW COMPARISON:  03/29/2020 FINDINGS: The heart size and mediastinal contours are within normal limits. Both lungs are clear. The visualized skeletal structures are unremarkable. IMPRESSION: No active cardiopulmonary disease. Electronically Signed   By: Fidela Salisbury MD   On: 06/01/2020 04:56    Procedures Procedures  CRITICAL CARE Performed by: Delora Fuel Total critical care time: 35 minutes Critical care time was exclusive of separately billable procedures and treating other patients. Critical care was necessary to treat or prevent imminent or life-threatening deterioration. Critical care was time spent personally by me on the following activities: development of treatment plan with patient and/or surrogate as well as nursing, discussions with consultants, evaluation of patient's response to treatment, examination of patient, obtaining history from  patient or surrogate, ordering and performing treatments and interventions, ordering and review of laboratory studies, ordering and review of radiographic studies, pulse oximetry and re-evaluation of patient's condition.  Medications Ordered in ED Medications  diltiazem (CARDIZEM) 1 mg/mL load via infusion 10 mg (has no administration in time range)    And  diltiazem (CARDIZEM) 125 mg in dextrose 5% 125 mL (1 mg/mL) infusion (has no administration in time range)    ED Course  I have reviewed the triage vital signs and the nursing notes.  Pertinent labs & imaging results that were available during my care of the patient were reviewed by me and considered in my medical decision making (see chart for details).  MDM Rules/Calculators/A&P Ashlee atrial fibrillation with rapid ventricular response.  Old records reviewed confirming history of Ashlee atrial fibrillation.  Electrical cardioversion was recommended, but patient states that she would prefer not to do that.  Accordingly, she is given diltiazem for rate control and cardiology will be consulted.  5:36 AM Discussed with Dr. Kalman Shan of cardiology.  She will be given a dose of flecainide 300 mg to see if that will convert her to sinus rhythm.  Cardiology team will round on her later this morning to assess response to flecainide and decide on disposition at that point.  Potassium was noted to be low, and she is given a dose of oral potassium.  5:52 AM Patient spontaneously converted to sinus rhythm before getting flecainide or diltiazem.  Will observe in the ED and, if rhythm stays stable, plan to discharge her.  She does have an appointment with her cardiologist for 1030 this morning, which she should keep.  6:36 AM Heart rhythm has been stable in sinus rhythm.  She is felt to be safe for discharge.  She is to keep her appointment with her cardiologist later today.  Final Clinical Impression(s) / ED Diagnoses Final diagnoses:    Ashlee atrial fibrillation with rapid ventricular response (HCC)  Chronic anticoagulation  Hypokalemia    Rx / DC Orders ED Discharge Orders    None       Delora Fuel, MD 16/07/37 [phone]

## 2020-06-01 NOTE — ED Triage Notes (Signed)
To ED for eval after waking up feeling like she was in Afib. States she is intermittently in afib, usually lasting an hr. States last time in afib was Monday, which lasted 12hrs. She was to see her cardiologist today. Alert and oriented. Complains of weakness. Pt is on eliquis. No cp or sob. Pt is burbing and feels like she has to have a BM.

## 2020-06-01 NOTE — Patient Instructions (Addendum)
Medication Instructions:   You may take an extra of Flecainide for break through Afib.  *If you need a refill on your cardiac medications before your next appointment, please call your pharmacy*  Lab Work: NONE ordered at this time of appointment   If you have labs (blood work) drawn today and your tests are completely normal, you will receive your results only by:  [place] (if you have MyChart) OR  A paper copy in the mail If you have any lab test that is abnormal or we need to change your treatment, we will call you to review the results.  Testing/Procedures: You have been referred to Afib clinic   Follow-Up: At [facility], you and your health needs are our priority.  As part of our continuing mission to provide you with exceptional heart care, we have created designated Provider Care Teams.  These Care Teams include your primary Cardiologist (physician) and Advanced Practice Providers (APPs -  Physician Assistants and Nurse Practitioners) who all work together to provide you with the care you need, when you need it.  Your next appointment:   Follow up as scheduled   The format for your next appointment:   In Person  Provider:   Skeet Latch, MD  Other Instructions

## 2020-06-01 NOTE — Discharge Instructions (Addendum)
KEep your appointment with the cardiologist today. Discuss with them your low potassium, as well as the problems you have been having with the atrial fibrillation.

## 2020-06-03 ENCOUNTER — Encounter: Payer: Self-pay | Admitting: Physician Assistant

## 2020-06-07 ENCOUNTER — Other Ambulatory Visit: Payer: Self-pay

## 2020-06-07 ENCOUNTER — Ambulatory Visit (HOSPITAL_COMMUNITY)
Admission: RE | Admit: 2020-06-07 | Discharge: 2020-06-07 | Disposition: A | Discharge status: Home / Self Care | Payer: Medicare Other | Source: Ambulatory Visit | Attending: Nurse Practitioner | Admitting: Nurse Practitioner

## 2020-06-07 VITALS — BP 130/66 | HR 57 | Ht 62.0 in | Wt 129.6 lb

## 2020-06-07 DIAGNOSIS — I480 Paroxysmal atrial fibrillation: Secondary | ICD-10-CM | POA: Insufficient documentation

## 2020-06-07 DIAGNOSIS — Z7901 Long term (current) use of anticoagulants: Secondary | ICD-10-CM | POA: Diagnosis not present

## 2020-06-07 DIAGNOSIS — Z79899 Other long term (current) drug therapy: Secondary | ICD-10-CM | POA: Diagnosis not present

## 2020-06-07 DIAGNOSIS — Z880 Allergy status to penicillin: Secondary | ICD-10-CM | POA: Diagnosis not present

## 2020-06-07 DIAGNOSIS — D6869 Other thrombophilia: Secondary | ICD-10-CM

## 2020-06-07 DIAGNOSIS — Z853 Personal history of malignant neoplasm of breast: Secondary | ICD-10-CM | POA: Diagnosis not present

## 2020-06-07 DIAGNOSIS — I10 Essential (primary) hypertension: Secondary | ICD-10-CM | POA: Diagnosis not present

## 2020-06-07 MED ORDER — DILTIAZEM HCL 30 MG PO TABS: ORAL_TABLET | ORAL | 1 refills | Status: DC

## 2020-06-07 NOTE — Patient Instructions (Signed)
Cardizem 30mg  -- take 1/2 to 1 tablet every 4 hours AS NEEDED for afib  heart rate >100 as long as top blood pressure >100.

## 2020-06-07 NOTE — Progress Notes (Signed)
Primary Care Physician: Jonathon Jordan, MD Referring Physician: Almyra Deforest, PA Cardiologist: Dr. Lajuana Ripple Ashlee is a 79 y.o. female with a h/o HTN, paroxysmal afib, bradycardia, syncope 7/23/21at time of syncope, she had taken prn metoprolol early that night for afib. She is in the afib clinic as f/u from  last ER visit. She injured her neck/back from  the fall in July and is looking at surgery in the near future.  Pt was placed on flecainide in July. She  could not tolerate 100 mg bid of flecainide so this was reduced to 50 mg bid. She is not on BB  with flecainide for prior bradycardia issues. She is in sinus brady today at 57 bpm. When she in in afib she can have rates 120-130 bpm. She gets very uncomfortable with the higher heart rates, and very anxious. No recent alcohol, tobacco or significant caffeine use.  On eliquis for a CHA2DS2VASc score of 3.  Today, she denies symptoms of palpitations, chest pain, shortness of breath, orthopnea, PND, lower extremity edema, dizziness, presyncope, syncope, or neurologic sequela. The patient is tolerating medications without difficulties and is otherwise without complaint today.   Past Medical History:  Diagnosis Date  . 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 Diastolic dysfunction 10/17/2534  . Essential hypertension 06/04/2016  . Family history of breast cancer   . Family history of ovarian cancer   . Palpitations 06/04/2016  . Paroxysmal atrial fibrillation ([place]) 08/22/2016   Past Surgical History:  Procedure Laterality Date  . BREAST EXCISIONAL BIOPSY Right   . SALPINGOOPHORECTOMY     preventative, due to [place] of ovarian cancer    Current Outpatient Medications  Medication Sig Dispense Refill  . apixaban (ELIQUIS) 5 MG TABS tablet TAKE 1 TABLET (5 MG TOTAL) BY MOUTH 2 (TWO) TIMES DAILY. 180 tablet 3  . flecainide (TAMBOCOR) 50 MG tablet TAKE 1 TABLET (50 MG TOTAL) BY MOUTH EVERY 12 (TWELVE) HOURS. 60 tablet 10  .  hydrochlorothiazide (HYDRODIURIL) 25 MG tablet Take 1 tablet (25 mg total) by mouth daily. 90 tablet 1  . lisinopril (PRINIVIL,ZESTRIL) 10 MG tablet Take 10 mg by mouth daily.     . potassium chloride SA (KLOR-CON) 20 MEQ tablet Take 1 tablet (20 mEq total) by mouth 2 (two) times daily. 180 tablet 3  . acetaminophen (TYLENOL) 500 MG tablet Take 500-1,000 mg by mouth every 6 (six) hours as needed for mild pain. (Patient not taking: Reported on 06/07/2020)    . diltiazem (CARDIZEM) 30 MG tablet Cardizem 30mg  -- take 1/2 to 1 tablet every 4 hours AS NEEDED for afib  heart rate >100 as long as top blood pressure >100. 45 tablet 1   No current facility-administered medications for this encounter.    Allergies  Allergen Reactions  . Penicillins Anaphylaxis    Social History   Socioeconomic History  . Marital status: Married    Spouse name: Not on file  . Number of children: 4  . Years of education: Not on file  . Highest education level: Not on file  Occupational History  . Not on file  Tobacco Use  . Smoking status: Never Smoker  . Smokeless tobacco: Never Used  Substance and Sexual Activity  . Alcohol use: Yes  . Drug use: No  . Sexual activity: Not on file  Other Topics Concern  . Not on file  Social History Narrative  . Not on file   Social  Determinants of Health   Financial Resource Strain:   . Difficulty of Paying Living Expenses: Not on file  Food Insecurity:   . Worried About Charity fundraiser in the Last Year: Not on file  . Ran Out of Food in the Last Year: Not on file  Transportation Needs:   . Lack of Transportation (Medical): Not on file  . Lack of Transportation (Non-Medical): Not on file  Physical Activity:   . Days of Exercise per Week: Not on file  . Minutes of Exercise per Session: Not on file  Stress:   . Feeling of Stress : Not on file  Social Connections:   . Frequency of Communication with Friends and Family: Not on file  . Frequency of Social  Gatherings with Friends and Family: Not on file  . Attends Religious Services: Not on file  . Active Member of Clubs or Organizations: Not on file  . Attends Archivist Meetings: Not on file  . Marital Status: Not on file  Intimate Partner Violence:   . Fear of Current or Ex-Partner: Not on file  . Emotionally Abused: Not on file  . Physically Abused: Not on file  . Sexually Abused: Not on file    Family History  Problem Relation Age of Onset  . Breast cancer Mother 23  . Hypertension Mother   . Heart attack Father   . Ovarian cancer Sister 71  . Breast cancer Paternal Aunt        dx in her 84s  . Ovarian cancer Maternal Grandmother        died at 27  . Breast cancer Maternal Grandmother   . Melanoma Daughter 32       on leg  . Breast cancer Cousin        dx in her 57s  . Breast cancer Maternal Aunt 64  . Heart attack Maternal Uncle   . Bone cancer Paternal Uncle   . Heart attack Maternal Grandfather   . Heart attack Maternal Aunt   . Prostate cancer Cousin 69    ROS- All systems are reviewed and negative except as per the HPI above  Physical Exam: Vitals:   06/07/20 1351  BP: 130/66  Pulse: (!) 57  Weight: 58.8 kg  Height: 5\' 2"  (1.575 m)   Wt Readings from Last 3 Encounters:  06/07/20 58.8 kg  06/01/20 57.9 kg  05/04/20 57.6 kg    Labs: Lab Results  Component Value Date   NA 138 06/01/2020   K 3.2 (L) 06/01/2020   CL 102 06/01/2020   CO2 22 06/01/2020   GLUCOSE 134 (H) 06/01/2020   BUN 10 06/01/2020   CREATININE 0.76 06/01/2020   CALCIUM 10.2 06/01/2020   MG 1.6 (L) 03/29/2020   No results found for: INR Lab Results  Component Value Date   CHOL 189 08/31/2019   HDL 84 08/31/2019   LDLCALC 92 08/31/2019   TRIG 73 08/31/2019     GEN- The patient is well appearing, alert and oriented x 3 today.   Head- normocephalic, atraumatic Eyes-  Sclera clear, conjunctiva pink Ears- hearing intact Oropharynx- clear Neck- supple, no  JVP Lymph- no cervical lymphadenopathy Lungs- Clear to ausculation bilaterally, normal work of breathing Heart- Regular rate and rhythm, no murmurs, rubs or gallops, PMI not laterally displaced GI- soft, NT, ND, + BS Extremities- no clubbing, cyanosis, or edema MS- no significant deformity or atrophy Skin- no rash or lesion Psych- euthymic mood, full affect Neuro-  strength and sensation are intact  EKG-sinus brady at 57 bpm, pr int 134 ms, qrs int 100 ms, 418 ms   Echo-1. Left ventricular ejection fraction, by estimation, is 60 to 65%. The  left ventricle has normal function. The left ventricle has no regional  wall motion abnormalities. Left ventricular diastolic parameters are  consistent with Grade II diastolic  dysfunction (pseudonormalization).  2. Right ventricular systolic function is normal. The right ventricular  size is normal. There is mildly elevated pulmonary artery systolic  pressure. The estimated right ventricular systolic pressure is 60.6 mmHg.  3. Left atrial size was mildly dilated.  4. The mitral valve is normal in structure. Trivial mitral valve  regurgitation. No evidence of mitral stenosis.  5. Tricuspid valve regurgitation is mild to moderate.  6. The aortic valve is tricuspid. Aortic valve regurgitation is mild.  Mild aortic valve sclerosis is present, with no evidence of aortic valve  stenosis.  7. The inferior vena cava is dilated in size with <50% respiratory  variability, suggesting right atrial pressure of 15 mmHg.    Assessment and Plan: 1. Paroxysmal afib Low afib burden overall but very symptomatic when in afib  On low dose flecainide, could not tolerate higher doses of flecainide  She is not on daily BB as she has bradycardia at baseline and had a prior syncopal episode after taking metoprolol at night  Ideally she should be on rate control daily  to  oppose flecainide  Multaq, amiodarone, sotalol could all contribute to bradycardia so  not good options I see pt's options as tikosyn or ablation  I will refer to EP as she feels she would like to consider the later In the interim, she will continue flecainide 50 mg bid and  will rx very short acting 30 mg  diltiazem to try 1/2 to 1 tab as needed for afib episodes for HR's over 004 and if  systolic BP over 599. Hopefully being short acting will clear faster and not significantly lower HR or BP  2. CHA2DS2VASc Continue eliquis 5 mg bid for CHA2DS2VASc score of 3.  Appointment with EP requested   Geroge Baseman. Gift Rueckert, [facility] [address] [place], [place] [address] [phone]

## 2020-06-08 ENCOUNTER — Encounter (HOSPITAL_COMMUNITY): Payer: Self-pay | Admitting: Nurse Practitioner

## 2020-06-14 ENCOUNTER — Ambulatory Visit (INDEPENDENT_AMBULATORY_CARE_PROVIDER_SITE_OTHER): Payer: Medicare Other | Admitting: Cardiology

## 2020-06-14 ENCOUNTER — Encounter: Payer: Self-pay | Admitting: Cardiology

## 2020-06-14 ENCOUNTER — Other Ambulatory Visit: Payer: Self-pay

## 2020-06-14 VITALS — BP 120/60 | HR 55 | Ht 62.0 in | Wt 130.0 lb

## 2020-06-14 DIAGNOSIS — I10 Essential (primary) hypertension: Secondary | ICD-10-CM | POA: Diagnosis not present

## 2020-06-14 DIAGNOSIS — I480 Paroxysmal atrial fibrillation: Secondary | ICD-10-CM

## 2020-06-14 NOTE — Patient Instructions (Addendum)
Medication Instructions:  Your physician recommends that you continue on your current medications as directed. Please refer to the Current Medication list given to you today. *If you need a refill on your cardiac medications before your next appointment, please call your pharmacy*  Lab Work: None ordered. If you have labs (blood work) drawn today and your tests are completely normal, you will receive your results only by: Marland Kitchen MyChart Message (if you have MyChart) OR . A paper copy in the mail If you have any lab test that is abnormal or we need to change your treatment, we will call you to review the results.  Testing/Procedures: None ordered.  Follow-Up: At [facility], you and your health needs are our priority.  As part of our continuing mission to provide you with exceptional heart care, we have created designated Provider Care Teams.  These Care Teams include your primary Cardiologist (physician) and Advanced Practice Providers (APPs -  Physician Assistants and Nurse Practitioners) who all work together to provide you with the care you need, when you need it.  Your next appointment:   Your physician wants you to follow-up in: 3 months with Dr. Quentin Ore.    September 23, 2019 at 9:45 am at the [facility] office

## 2020-06-14 NOTE — Progress Notes (Signed)
Electrophysiology Office Note:    Date:  06/14/2020   ID:  Bula Cavalieri, DOB 15-Apr-1941, MRN [id number]  PCP:  Jonathon Jordan, MD  [facility] Cardiologist:  Skeet Latch, MD  [facility] Electrophysiologist:  None   Referring MD: Sherran Needs, NP   Chief Complaint: Atrial fibrillation  History of Present Illness:    Ashlee Flores is a 79 y.o. female who presents for an evaluation of atrial fibrillation at the request of Roderic Palau, NP. Their medical history includes hypertension, breast cancer, recent neck injury awaiting neurosurgical procedure.  She tells me that she has very symptomatic paroxysms of atrial fibrillation with rapid ventricular rates in the 120 to 130 bpm range.  No identifiable triggers.  She does well on Eliquis without any bleeding problems.  She was initially prescribed flecainide 100 mg p.o. twice daily in July 2021 but this was not tolerated so it was reduced to 50 mg twice daily.  Past Medical History:  Diagnosis Date  . 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 Diastolic dysfunction 05/17/4162  . Essential hypertension 06/04/2016  . Family history of breast cancer   . Family history of ovarian cancer   . Palpitations 06/04/2016  . Paroxysmal atrial fibrillation ([place]) 08/22/2016    Past Surgical History:  Procedure Laterality Date  . BREAST EXCISIONAL BIOPSY Right   . SALPINGOOPHORECTOMY     preventative, due to [place] of ovarian cancer    Current Medications: Current Meds  Medication Sig  . acetaminophen (TYLENOL) 500 MG tablet Take 500-1,000 mg by mouth every 6 (six) hours as needed for mild pain.   Marland Kitchen apixaban (ELIQUIS) 5 MG TABS tablet TAKE 1 TABLET (5 MG TOTAL) BY MOUTH 2 (TWO) TIMES DAILY.  Marland Kitchen diltiazem (CARDIZEM) 30 MG tablet Cardizem 30mg  -- take 1/2 to 1 tablet every 4 hours AS NEEDED for afib  heart rate >100 as long as top blood pressure >100.  . flecainide (TAMBOCOR) 50 MG tablet TAKE 1 TABLET (50 MG TOTAL) BY MOUTH  EVERY 12 (TWELVE) HOURS.  . hydrochlorothiazide (HYDRODIURIL) 25 MG tablet Take 1 tablet (25 mg total) by mouth daily.  Marland Kitchen lisinopril (PRINIVIL,ZESTRIL) 10 MG tablet Take 10 mg by mouth daily.   . potassium chloride SA (KLOR-CON) 20 MEQ tablet Take 1 tablet (20 mEq total) by mouth 2 (two) times daily.     Allergies:   Penicillins   Social History   Socioeconomic History  . Marital status: Married    Spouse name: Not on file  . Number of children: 4  . Years of education: Not on file  . Highest education level: Not on file  Occupational History  . Not on file  Tobacco Use  . Smoking status: Never Smoker  . Smokeless tobacco: Never Used  Substance and Sexual Activity  . Alcohol use: Yes  . Drug use: No  . Sexual activity: Not on file  Other Topics Concern  . Not on file  Social History Narrative  . Not on file   Social Determinants of Health   Financial Resource Strain:   . Difficulty of Paying Living Expenses: Not on file  Food Insecurity:   . Worried About Charity fundraiser in the Last Year: Not on file  . Ran Out of Food in the Last Year: Not on file  Transportation Needs:   . Lack of Transportation (Medical): Not on file  . Lack of Transportation (Non-Medical): Not on file  Physical Activity:   .  Days of Exercise per Week: Not on file  . Minutes of Exercise per Session: Not on file  Stress:   . Feeling of Stress : Not on file  Social Connections:   . Frequency of Communication with Friends and Family: Not on file  . Frequency of Social Gatherings with Friends and Family: Not on file  . Attends Religious Services: Not on file  . Active Member of Clubs or Organizations: Not on file  . Attends Archivist Meetings: Not on file  . Marital Status: Not on file     Family History: The patient's family history includes Bone cancer in her paternal uncle; Breast cancer in her cousin, maternal grandmother, and paternal aunt; Breast cancer (age of onset: 40) in  her maternal aunt; Breast cancer (age of onset: 55) in her mother; Heart attack in her father, maternal aunt, maternal grandfather, and maternal uncle; Hypertension in her mother; Melanoma (age of onset: 59) in her daughter; Ovarian cancer in her maternal grandmother; Ovarian cancer (age of onset: 81) in her sister; Prostate cancer (age of onset: 39) in her cousin.  ROS:   Please see the history of present illness.    All other systems reviewed and are negative.  EKGs/Labs/Other Studies Reviewed:    The following studies were reviewed today: Echo, ECG  March 30, 2020 echo personally reviewed Left ventricular function normal at 60%.  Right ventricular function normal.  Mild dilation of left atrium.  Mild to moderate tricuspid regurgitation.  EKG:  The ekg ordered today demonstrates sinus rhythm.  Recent Labs: 03/29/2020: ALT 20; Magnesium 1.6 06/01/2020: BUN 10; Creatinine, Ser 0.76; Hemoglobin 14.5; Platelets 212; Potassium 3.2; Sodium 138  Recent Lipid Panel    Component Value Date/Time   CHOL 189 08/31/2019 1030   TRIG 73 08/31/2019 1030   HDL 84 08/31/2019 1030   CHOLHDL 2.3 08/31/2019 1030   LDLCALC 92 08/31/2019 1030    Physical Exam:    VS:  BP 120/60   Pulse (!) 55   Ht 5\' 2"  (1.575 m)   Wt 130 lb (59 kg)   SpO2 98%   BMI 23.78 kg/m     Wt Readings from Last 3 Encounters:  06/14/20 130 lb (59 kg)  06/07/20 129 lb 9.6 oz (58.8 kg)  06/01/20 127 lb 9.6 oz (57.9 kg)     GEN:  Well nourished, well developed in no acute distress HEENT: Normal NECK: No JVD; No carotid bruits LYMPHATICS: No lymphadenopathy CARDIAC: RRR, no murmurs, rubs, gallops RESPIRATORY:  Clear to auscultation without rales, wheezing or rhonchi  ABDOMEN: Soft, non-tender, non-distended MUSCULOSKELETAL:  No edema; No deformity  SKIN: Warm and dry NEUROLOGIC:  Alert and oriented x 3 PSYCHIATRIC:  Normal affect   ASSESSMENT:    1. PAF (paroxysmal atrial fibrillation) ([place])   2. Essential  hypertension    PLAN:    In order of problems listed above:  1. Paroxysmal atrial fibrillation Continue apixaban for stroke prophylaxis Continue diltiazem and flecainide I discussed the management options for her atrial fibrillation including continued antiarrhythmic therapy with flecainide versus ablation.  Risks, benefits of ablation were discussed in detail with the patient and her husband who is with her today.  She has an upcoming neurosurgical procedure for which she will have to interrupt her anticoagulant.   She would like some time to think about ablation therapy and will reach out to [place] if she would like to schedule.    Risk, benefits, and alternatives to EP study  and radiofrequency ablation for afib were also discussed in detail today. These risks include but are not limited to stroke, bleeding, vascular damage, tamponade, perforation, damage to the esophagus, lungs, and other structures, pulmonary vein stenosis, worsening renal function, and death.   Medication Adjustments/Labs and Tests Ordered: Current medicines are reviewed at length with the patient today.  Concerns regarding medicines are outlined above.  Orders Placed This Encounter  Procedures  . EKG 12-Lead   No orders of the defined types were placed in this encounter.    Signed, Lars Mage, MD, [facility]  06/14/2020 5:05 PM    Electrophysiology [facility]

## 2020-06-24 DIAGNOSIS — Z23 Encounter for immunization: Secondary | ICD-10-CM | POA: Diagnosis not present

## 2020-06-27 ENCOUNTER — Telehealth: Payer: Self-pay | Admitting: Cardiology

## 2020-06-27 ENCOUNTER — Other Ambulatory Visit: Payer: Self-pay | Admitting: Neurosurgery

## 2020-06-27 NOTE — Telephone Encounter (Signed)
I will send the call to Dr. Quentin Ore for further advice if the pt has been cleared for their surgery.

## 2020-06-27 NOTE — Telephone Encounter (Signed)
New message   Pt wants to know about if she is going to be cleared for a procedure. She said she was supposed to see Dr. Quentin Ore before getting a decision.

## 2020-06-27 NOTE — Telephone Encounter (Signed)
Patient states that Dr. Quentin Ore wanted her to have a neck surgery prior to ablation. She says it has been a while since she saw him and he said that and that she still hasn't heard anything about getting the neck surgery done. Please call/advise.

## 2020-06-27 NOTE — Telephone Encounter (Signed)
Dr. Quentin Ore, Almyra Deforest [facility] saw this patient on 06/01/20 and cleared her for surgery. However, she now prefers to have clearance from you. OK to proceed with back/neck surgery?

## 2020-06-28 NOTE — Telephone Encounter (Signed)
Pt made aware that she can proceed with her surgery. Pt thanked me for the call

## 2020-06-28 NOTE — Telephone Encounter (Signed)
   Primary Cardiologist: Skeet Latch, MD  Chart reviewed as part of pre-operative protocol coverage.   Ashlee Flores was last seen on 06/01/20 by Almyra Deforest PAC.   Per PA Meng and Dr. Quentin Ore, patient is cleared for surgery without additional testing. She may hold anticoagulation for three days prior to surgery. Resume when safe to do so per surgeon.  Therefore, based on ACC/AHA guidelines, the patient would be at acceptable risk for the planned procedure without further cardiovascular testing.   I will route this recommendation to the requesting party via Epic fax function and remove from pre-op pool. Please call with questions.  Ashlee Flores [name], PA 06/28/2020, 9:35 AM

## 2020-06-30 ENCOUNTER — Other Ambulatory Visit (HOSPITAL_COMMUNITY): Payer: Self-pay | Admitting: Nurse Practitioner

## 2020-07-13 ENCOUNTER — Telehealth: Payer: Self-pay | Admitting: Cardiovascular Disease

## 2020-07-13 NOTE — Telephone Encounter (Signed)
Pt mentioned that she got to have back surgery and has to be off of Eliquis for 3 days prior to surgery and a few weeks after. Wants to know how that will affect her. Please call

## 2020-07-13 NOTE — Telephone Encounter (Signed)
Pt aware surgeon needs to fax cardiac clearance form to office if needing clearance prior to procedure on 07/22/20 ./cy

## 2020-07-15 NOTE — Telephone Encounter (Signed)
SHe is low risk for stroke while it is held.  Hold the Eliquis as the surgeons request.  No bridge needed.

## 2020-07-18 NOTE — Pre-Procedure Instructions (Signed)
[id number] [place], [place] [address] [place] [address] Phone: [phone] Fax: [phone]      Your procedure is scheduled on Friday, November 12th.  Report to [facility] Main Entrance "A" at 6:00 A.M., and check in at the Admitting office.  Call this number if you have problems the morning of surgery:  [phone]  Call [phone] if you have any questions prior to your surgery date Monday-Friday 8am-4pm    Remember:  Do not eat or drink after midnight the night before your surgery    Take these medicines the morning of surgery with A SIP OF WATER  flecainide (TAMBOCOR)  diltiazem (CARDIZEM) (if needed)  Follow your surgeon's instructions on when to stop Eliquis.  If no instructions were given by your surgeon then you will need to call the office to get those instructions.     As of today, STOP taking any Aspirin (unless otherwise instructed by your surgeon) Aleve, Naproxen, Ibuprofen, Motrin, Advil, Goody's, BC's, all herbal medications, fish oil, and all vitamins.                      Do not wear jewelry, make up, or nail polish            Do not wear lotions, powders, perfumes/colognes, or deodorant.            Do not shave 48 hours prior to surgery.  Men may shave face and neck.            Do not bring valuables to the hospital.            [facility] is not responsible for any belongings or valuables.  Do NOT Smoke (Tobacco/Vaping) or drink Alcohol 24 hours prior to your procedure If you use a CPAP at night, you may bring all equipment for your overnight stay.   Contacts, glasses, dentures or bridgework may not be worn into surgery.      For patients admitted to the hospital, discharge time will be determined by your treatment team.   Patients discharged the day of surgery will not be allowed to drive home, and someone needs to stay with them for 24 hours.    Special  instructions:   [place]- Preparing For Surgery  Before surgery, you can play an important role. Because skin is not sterile, your skin needs to be as free of germs as possible. You can reduce the number of germs on your skin by washing with CHG (chlorahexidine gluconate) Soap before surgery.  CHG is an antiseptic cleaner which kills germs and bonds with the skin to continue killing germs even after washing.    Oral Hygiene is also important to reduce your risk of infection.  Remember - BRUSH YOUR TEETH THE MORNING OF SURGERY WITH YOUR REGULAR TOOTHPASTE  Please do not use if you have an allergy to CHG or antibacterial soaps. If your skin becomes reddened/irritated stop using the CHG.  Do not shave (including legs and underarms) for at least 48 hours prior to first CHG shower. It is OK to shave your face.  Please follow these instructions carefully.   1. Shower the NIGHT BEFORE SURGERY and the MORNING OF SURGERY with CHG Soap.   2. If you chose to wash your hair, wash your hair first as usual with your normal shampoo.  3. After you shampoo, rinse your hair and body thoroughly  to remove the shampoo.  4. Use CHG as you would any other liquid soap. You can apply CHG directly to the skin and wash gently with a scrungie or a clean washcloth.   5. Apply the CHG Soap to your body ONLY FROM THE NECK DOWN.  Do not use on open wounds or open sores. Avoid contact with your eyes, ears, mouth and genitals (private parts). Wash Face and genitals (private parts)  with your normal soap.   6. Wash thoroughly, paying special attention to the area where your surgery will be performed.  7. Thoroughly rinse your body with warm water from the neck down.  8. DO NOT shower/wash with your normal soap after using and rinsing off the CHG Soap.  9. Pat yourself dry with a CLEAN TOWEL.  10. Wear CLEAN PAJAMAS to bed the night before surgery  11. Place CLEAN SHEETS on your bed the night of your first shower and  DO NOT SLEEP WITH PETS.   Day of Surgery: Wear Clean/Comfortable clothing the morning of surgery Do not apply any deodorants/lotions.   Remember to brush your teeth WITH YOUR REGULAR TOOTHPASTE.   Please read over the following fact sheets that you were given.

## 2020-07-19 ENCOUNTER — Other Ambulatory Visit: Payer: Self-pay

## 2020-07-19 ENCOUNTER — Encounter (HOSPITAL_COMMUNITY): Payer: Self-pay

## 2020-07-19 ENCOUNTER — Other Ambulatory Visit (HOSPITAL_COMMUNITY): Payer: Medicare Other

## 2020-07-19 ENCOUNTER — Encounter (HOSPITAL_COMMUNITY)
Admission: RE | Admit: 2020-07-19 | Discharge: 2020-07-19 | Disposition: A | Discharge status: Home / Self Care | Payer: Medicare Other | Source: Ambulatory Visit | Attending: Neurosurgery | Admitting: Neurosurgery

## 2020-07-19 DIAGNOSIS — Z01812 Encounter for preprocedural laboratory examination: Secondary | ICD-10-CM | POA: Diagnosis not present

## 2020-07-19 HISTORY — DX: Unspecified osteoarthritis, unspecified site: M19.90

## 2020-07-19 HISTORY — DX: Cardiac arrhythmia, unspecified: I49.9

## 2020-07-19 LAB — TYPE AND SCREEN
ABO/RH(D): B POS
Antibody Screen: NEGATIVE

## 2020-07-19 LAB — BASIC METABOLIC PANEL
Anion gap: 10 (ref 5–15)
BUN: 9 mg/dL (ref 8–23)
CO2: 26 mmol/L (ref 22–32)
Calcium: 10.7 mg/dL — ABNORMAL HIGH (ref 8.9–10.3)
Chloride: 101 mmol/L (ref 98–111)
Creatinine, Ser: 0.6 mg/dL (ref 0.44–1.00)
GFR, Estimated: >60 mL/min (ref >60)
Glucose, Bld: 73 mg/dL (ref 70–99)
Potassium: 3.5 mmol/L (ref 3.5–5.1)
Sodium: 137 mmol/L (ref 135–145)

## 2020-07-19 LAB — CBC
HCT: 42.2 % (ref 36.0–46.0)
Hemoglobin: 14.5 g/dL (ref 12.0–15.0)
MCH: 31.6 pg (ref 26.0–34.0)
MCHC: 34.4 g/dL (ref 30.0–36.0)
MCV: 91.9 fL (ref 80.0–100.0)
Platelets: 233 10*3/uL (ref 150–400)
RBC: 4.59 MIL/uL (ref 3.87–5.11)
RDW: 13 % (ref 11.5–15.5)
WBC: 5.7 10*3/uL (ref 4.0–10.5)
nRBC: 0 % (ref 0.0–0.2)

## 2020-07-19 LAB — SURGICAL PCR SCREEN
MRSA, PCR: NEGATIVE
Staphylococcus aureus: NEGATIVE

## 2020-07-19 NOTE — Telephone Encounter (Signed)
Left detailed message, ok per DPR  Never received surgical clearance request

## 2020-07-19 NOTE — Progress Notes (Signed)
PCP - Jonathon Jordan, MD Cardiologist - Skeet Latch, MD  PPM/ICD - Denies  Chest x-ray - 06/01/20 EKG - 06/14/20 Stress Test - 05/04/20 ECHO - 03/30/20 Cardiac Cath - Denies  Sleep Study - Denies  Patient denies being diabetic.  Blood Thinner Instructions: Per patient, last dose Eliquis 07/19/20 Aspirin Instructions: N/A  ERAS Protcol - N/A PRE-SURGERY Ensure or G2- N/A  COVID TEST- 07/21/20   Anesthesia review: Yes, cardiac hx.  Patient denies shortness of breath, fever, cough and chest pain at PAT appointment   All instructions explained to the patient, with a verbal understanding of the material. Patient agrees to go over the instructions while at home for a better understanding. Patient also instructed to self quarantine after being tested for COVID-19. The opportunity to ask questions was provided.

## 2020-07-20 ENCOUNTER — Other Ambulatory Visit (HOSPITAL_COMMUNITY): Payer: Medicare Other

## 2020-07-20 NOTE — Anesthesia Preprocedure Evaluation (Addendum)
Anesthesia Evaluation  Patient identified by MRN, date of birth, ID band Patient awake    Reviewed: Allergy & Precautions, NPO status , Patient's Chart, lab work & pertinent test results  History of Anesthesia Complications Negative for: history of anesthetic complications  Airway Mallampati: II  TM Distance: >3 FB Neck ROM: Limited    Dental  (+) Dental Advisory Given, Chipped,    Pulmonary neg pulmonary ROS,    Pulmonary exam normal        Cardiovascular hypertension, Pt. on medications Normal cardiovascular exam+ dysrhythmias Atrial Fibrillation    '21 Myoperfusion - Nuclear stress EF: 62%. There was no ST segment deviation noted during stress. The study is normal. This is a low risk study.  '21 TTE - EF 60 to 65%. Grade II diastolic dysfunction (pseudonormalization). Mildly elevated pulmonary artery systolic pressure. The estimated right ventricular systolic pressure is 32.4 mmHg. LA was mildly dilated. Trivial MR. Mild to moderate TR. Mild AI. Mild aortic valve sclerosis is present, with no evidence of aortic valve stenosis.   See PAT note re: cardiac clearance    Neuro/Psych negative psych ROS   GI/Hepatic negative GI ROS, Neg liver ROS,   Endo/Other  negative endocrine ROS  Renal/GU negative Renal ROS     Musculoskeletal  (+) Arthritis ,   Abdominal   Peds  Hematology  On eliquis    Anesthesia Other Findings Covid test negative   Reproductive/Obstetrics  Breast cancer                           Anesthesia Physical Anesthesia Plan  ASA: III  Anesthesia Plan: General   Post-op Pain Management:    Induction: Intravenous  PONV Risk Score and Plan: 4 or greater and Treatment may vary due to age or medical condition, Ondansetron and Propofol infusion  Airway Management Planned: Oral ETT and Video Laryngoscope Planned  Additional Equipment: None  Intra-op Plan:    Post-operative Plan: Extubation in OR  Informed Consent: I have reviewed the patients History and Physical, chart, labs and discussed the procedure including the risks, benefits and alternatives for the proposed anesthesia with the patient or authorized representative who has indicated his/her understanding and acceptance.     Dental advisory given  Plan Discussed with: CRNA and Anesthesiologist  Anesthesia Plan Comments:       Anesthesia Quick Evaluation

## 2020-07-20 NOTE — Progress Notes (Signed)
Anesthesia Chart Review:  Follows with cardiology for hx of paroxysmal afib on eliquis and flecainide, diastolic dysfunction, HTN. Preop clearance per telephone encounter 06/28/20, "Chart reviewed as part of pre-operative protocol coverage.   Ashlee Flores was last seen on 06/01/20 by Almyra Deforest PAC. Per PA Meng and Dr. Quentin Ore, patient is cleared for surgery without additional testing. She may hold anticoagulation for three days prior to surgery. Resume when safe to do so per surgeon. Therefore, based on ACC/AHA guidelines, the patient would be at acceptable risk for the planned procedure without further cardiovascular testing."  Preop labs reviewed, unremarkable.   EKG 06/14/20: Sinus bradycardia. Rate 54.   CHEST - 2 VIEW 06/01/20: COMPARISON:  03/29/2020  FINDINGS: The heart size and mediastinal contours are within normal limits. Both lungs are clear. The visualized skeletal structures are unremarkable.  IMPRESSION: No active cardiopulmonary disease.   Nuclear stress 05/04/20:  The left ventricular ejection fraction is normal (55-65%).  Nuclear stress EF: 62%.  There was no ST segment deviation noted during stress.  The study is normal.  This is a low risk study.  Event monitor 04/29/20:  Sinus bradycardia, normal sinue rhythm and sinus tachycardia. The average heart rate was 55bpm and ranged from 39 to 85bpm.  Rare PAC  TTE 03/30/20: 1. Left ventricular ejection fraction, by estimation, is 60 to 65%. The  left ventricle has normal function. The left ventricle has no regional  wall motion abnormalities. Left ventricular diastolic parameters are  consistent with Grade II diastolic  dysfunction (pseudonormalization).  2. Right ventricular systolic function is normal. The right ventricular  size is normal. There is mildly elevated pulmonary artery systolic  pressure. The estimated right ventricular systolic pressure is 82.4 mmHg.  3. Left atrial size was mildly  dilated.  4. The mitral valve is normal in structure. Trivial mitral valve  regurgitation. No evidence of mitral stenosis.  5. Tricuspid valve regurgitation is mild to moderate.  6. The aortic valve is tricuspid. Aortic valve regurgitation is mild.  Mild aortic valve sclerosis is present, with no evidence of aortic valve  stenosis.  7. The inferior vena cava is dilated in size with <50% respiratory  variability, suggesting right atrial pressure of 15 mmHg.    Wynonia Musty [facility] Short Stay Center/Anesthesiology Phone [phone] 07/20/2020 2:11 PM

## 2020-07-21 ENCOUNTER — Other Ambulatory Visit (HOSPITAL_COMMUNITY)
Admission: RE | Admit: 2020-07-21 | Discharge: 2020-07-21 | Disposition: A | Discharge status: Home / Self Care | Payer: Medicare Other | Source: Ambulatory Visit | Attending: Neurosurgery | Admitting: Neurosurgery

## 2020-07-21 DIAGNOSIS — Z01812 Encounter for preprocedural laboratory examination: Secondary | ICD-10-CM | POA: Insufficient documentation

## 2020-07-21 DIAGNOSIS — Z20822 Contact with and (suspected) exposure to covid-19: Secondary | ICD-10-CM | POA: Diagnosis not present

## 2020-07-21 LAB — SARS CORONAVIRUS 2 (TAT 6-24 HRS): SARS Coronavirus 2: NEGATIVE

## 2020-07-22 ENCOUNTER — Observation Stay (HOSPITAL_COMMUNITY)
Admission: RE | Admit: 2020-07-22 | Discharge: 2020-07-23 | Disposition: A | Discharge status: Home / Self Care | Payer: Medicare Other | Attending: Neurosurgery | Admitting: Neurosurgery

## 2020-07-22 ENCOUNTER — Encounter (HOSPITAL_COMMUNITY)
Admission: RE | Disposition: A | Discharge status: Home / Self Care | Payer: Self-pay | Source: Home / Self Care | Attending: Neurosurgery

## 2020-07-22 ENCOUNTER — Encounter (HOSPITAL_COMMUNITY): Payer: Self-pay | Admitting: Neurosurgery

## 2020-07-22 ENCOUNTER — Other Ambulatory Visit: Payer: Self-pay

## 2020-07-22 ENCOUNTER — Inpatient Hospital Stay (HOSPITAL_COMMUNITY): Payer: Medicare Other | Admitting: Physician Assistant

## 2020-07-22 ENCOUNTER — Inpatient Hospital Stay (HOSPITAL_COMMUNITY): Payer: Medicare Other

## 2020-07-22 ENCOUNTER — Inpatient Hospital Stay (HOSPITAL_COMMUNITY): Payer: Medicare Other | Admitting: Anesthesiology

## 2020-07-22 DIAGNOSIS — Z981 Arthrodesis status: Secondary | ICD-10-CM | POA: Diagnosis not present

## 2020-07-22 DIAGNOSIS — Z79899 Other long term (current) drug therapy: Secondary | ICD-10-CM | POA: Insufficient documentation

## 2020-07-22 DIAGNOSIS — G959 Disease of spinal cord, unspecified: Secondary | ICD-10-CM | POA: Diagnosis not present

## 2020-07-22 DIAGNOSIS — Z853 Personal history of malignant neoplasm of breast: Secondary | ICD-10-CM | POA: Insufficient documentation

## 2020-07-22 DIAGNOSIS — I10 Essential (primary) hypertension: Secondary | ICD-10-CM | POA: Diagnosis not present

## 2020-07-22 DIAGNOSIS — I480 Paroxysmal atrial fibrillation: Secondary | ICD-10-CM | POA: Insufficient documentation

## 2020-07-22 DIAGNOSIS — M4802 Spinal stenosis, cervical region: Secondary | ICD-10-CM | POA: Diagnosis not present

## 2020-07-22 DIAGNOSIS — Z7901 Long term (current) use of anticoagulants: Secondary | ICD-10-CM | POA: Insufficient documentation

## 2020-07-22 DIAGNOSIS — M4712 Other spondylosis with myelopathy, cervical region: Principal | ICD-10-CM | POA: Insufficient documentation

## 2020-07-22 DIAGNOSIS — M4326 Fusion of spine, lumbar region: Secondary | ICD-10-CM | POA: Diagnosis not present

## 2020-07-22 DIAGNOSIS — Z419 Encounter for procedure for purposes other than remedying health state, unspecified: Principal | ICD-10-CM

## 2020-07-22 DIAGNOSIS — G992 Myelopathy in diseases classified elsewhere: Secondary | ICD-10-CM | POA: Diagnosis not present

## 2020-07-22 DIAGNOSIS — R531 Weakness: Secondary | ICD-10-CM | POA: Diagnosis present

## 2020-07-22 HISTORY — PX: ANTERIOR CERVICAL DECOMP/DISCECTOMY FUSION: SHX1161

## 2020-07-22 LAB — ABO/RH: ABO/RH(D): B POS

## 2020-07-22 SURGERY — ANTERIOR CERVICAL DECOMPRESSION/DISCECTOMY FUSION 3 LEVELS
Anesthesia: General

## 2020-07-22 MED ORDER — EPHEDRINE SULFATE-NACL 50-0.9 MG/10ML-% IV SOSY
PREFILLED_SYRINGE | INTRAVENOUS | Status: DC | PRN
  Administered 2020-07-22: 10 mg via INTRAVENOUS
  Administered 2020-07-22: 5 mg via INTRAVENOUS

## 2020-07-22 MED ORDER — PHENYLEPHRINE 40 MCG/ML (10ML) SYRINGE FOR IV PUSH (FOR BLOOD PRESSURE SUPPORT)
PREFILLED_SYRINGE | INTRAVENOUS | Status: DC | PRN
  Administered 2020-07-22: 80 ug via INTRAVENOUS

## 2020-07-22 MED ORDER — ROCURONIUM BROMIDE 10 MG/ML (PF) SYRINGE
PREFILLED_SYRINGE | INTRAVENOUS | Status: DC | PRN
  Administered 2020-07-22: 40 mg via INTRAVENOUS
  Administered 2020-07-22: 10 mg via INTRAVENOUS
  Administered 2020-07-22: 40 mg via INTRAVENOUS

## 2020-07-22 MED ORDER — HYDROCODONE-ACETAMINOPHEN 5-325 MG PO TABS: 1.0000 | ORAL_TABLET | ORAL | Status: DC | PRN

## 2020-07-22 MED ORDER — ACETAMINOPHEN 10 MG/ML IV SOLN
INTRAVENOUS | Status: DC | PRN
  Administered 2020-07-22: 1000 mg via INTRAVENOUS

## 2020-07-22 MED ORDER — CHLORHEXIDINE GLUCONATE 0.12 % MT SOLN
15.0000 mL | Freq: Once | OROMUCOSAL | Status: AC
  Administered 2020-07-22: 15 mL via OROMUCOSAL
  Filled 2020-07-22: qty 15

## 2020-07-22 MED ORDER — SODIUM CHLORIDE 0.9% FLUSH
3.0000 mL | Freq: Two times a day (BID) | INTRAVENOUS | Status: DC
  Administered 2020-07-22: 3 mL via INTRAVENOUS

## 2020-07-22 MED ORDER — PROPOFOL 10 MG/ML IV BOLUS
INTRAVENOUS | Status: AC
  Filled 2020-07-22: qty 20

## 2020-07-22 MED ORDER — ORAL CARE MOUTH RINSE: 15.0000 mL | Freq: Once | OROMUCOSAL | Status: AC

## 2020-07-22 MED ORDER — ONDANSETRON HCL 4 MG/2ML IJ SOLN: 4.0000 mg | Freq: Once | INTRAMUSCULAR | Status: DC | PRN

## 2020-07-22 MED ORDER — ACETAMINOPHEN 325 MG PO TABS
650.0000 mg | ORAL_TABLET | ORAL | Status: DC | PRN
  Administered 2020-07-22: 650 mg via ORAL
  Filled 2020-07-22: qty 2

## 2020-07-22 MED ORDER — VITAMIN D 25 MCG (1000 UNIT) PO TABS
2000.0000 [IU] | ORAL_TABLET | Freq: Every day | ORAL | Status: DC
  Filled 2020-07-22: qty 2

## 2020-07-22 MED ORDER — CALCIUM CARBONATE-VITAMIN D 500-200 MG-UNIT PO TABS: 1.0000 | ORAL_TABLET | Freq: Every day | ORAL | Status: DC

## 2020-07-22 MED ORDER — 0.9 % SODIUM CHLORIDE (POUR BTL) OPTIME
TOPICAL | Status: DC | PRN
  Administered 2020-07-22: 1000 mL

## 2020-07-22 MED ORDER — THROMBIN 5000 UNITS EX SOLR
OROMUCOSAL | Status: DC | PRN
  Administered 2020-07-22: 5 mL

## 2020-07-22 MED ORDER — LISINOPRIL 10 MG PO TABS
10.0000 mg | ORAL_TABLET | Freq: Every day | ORAL | Status: DC
  Administered 2020-07-23: 10 mg via ORAL
  Filled 2020-07-22: qty 1

## 2020-07-22 MED ORDER — SCOPOLAMINE 1 MG/3DAYS TD PT72
MEDICATED_PATCH | TRANSDERMAL | Status: AC
  Filled 2020-07-22: qty 1

## 2020-07-22 MED ORDER — HYDROMORPHONE HCL 1 MG/ML IJ SOLN: 1.0000 mg | INTRAMUSCULAR | Status: DC | PRN

## 2020-07-22 MED ORDER — EPHEDRINE 5 MG/ML INJ
INTRAVENOUS | Status: AC
  Filled 2020-07-22: qty 10

## 2020-07-22 MED ORDER — FENTANYL CITRATE (PF) 100 MCG/2ML IJ SOLN
25.0000 ug | INTRAMUSCULAR | Status: DC | PRN
  Administered 2020-07-22: 25 ug via INTRAVENOUS
  Administered 2020-07-22: 50 ug via INTRAVENOUS
  Administered 2020-07-22: 25 ug via INTRAVENOUS

## 2020-07-22 MED ORDER — FENTANYL CITRATE (PF) 250 MCG/5ML IJ SOLN
INTRAMUSCULAR | Status: AC
  Filled 2020-07-22: qty 5

## 2020-07-22 MED ORDER — SUGAMMADEX SODIUM 200 MG/2ML IV SOLN
INTRAVENOUS | Status: DC | PRN
  Administered 2020-07-22: 150 mg via INTRAVENOUS

## 2020-07-22 MED ORDER — THROMBIN 20000 UNITS EX SOLR
CUTANEOUS | Status: DC | PRN
  Administered 2020-07-22: 20 mL via TOPICAL

## 2020-07-22 MED ORDER — CHLORHEXIDINE GLUCONATE CLOTH 2 % EX PADS: 6.0000 | MEDICATED_PAD | Freq: Once | CUTANEOUS | Status: DC

## 2020-07-22 MED ORDER — ONDANSETRON HCL 4 MG/2ML IJ SOLN
INTRAMUSCULAR | Status: DC | PRN
  Administered 2020-07-22: 4 mg via INTRAVENOUS

## 2020-07-22 MED ORDER — FENTANYL CITRATE (PF) 100 MCG/2ML IJ SOLN
INTRAMUSCULAR | Status: AC
  Filled 2020-07-22: qty 2

## 2020-07-22 MED ORDER — LACTATED RINGERS IV SOLN: INTRAVENOUS | Status: DC

## 2020-07-22 MED ORDER — HEMOSTATIC AGENTS (NO CHARGE) OPTIME
TOPICAL | Status: DC | PRN
  Administered 2020-07-22: 1 via TOPICAL

## 2020-07-22 MED ORDER — PROPOFOL 10 MG/ML IV BOLUS
INTRAVENOUS | Status: DC | PRN
  Administered 2020-07-22: 120 mg via INTRAVENOUS
  Administered 2020-07-22: 40 mg via INTRAVENOUS
  Administered 2020-07-22: 20 mg via INTRAVENOUS

## 2020-07-22 MED ORDER — ONDANSETRON HCL 4 MG/2ML IJ SOLN: 4.0000 mg | Freq: Four times a day (QID) | INTRAMUSCULAR | Status: DC | PRN

## 2020-07-22 MED ORDER — ONDANSETRON HCL 4 MG PO TABS: 4.0000 mg | ORAL_TABLET | Freq: Four times a day (QID) | ORAL | Status: DC | PRN

## 2020-07-22 MED ORDER — ACETAMINOPHEN 500 MG PO TABS
1000.0000 mg | ORAL_TABLET | Freq: Four times a day (QID) | ORAL | Status: DC | PRN
  Administered 2020-07-22 – 2020-07-23 (×2): 1000 mg via ORAL
  Filled 2020-07-22 (×2): qty 2

## 2020-07-22 MED ORDER — SODIUM CHLORIDE 0.9% FLUSH: 3.0000 mL | INTRAVENOUS | Status: DC | PRN

## 2020-07-22 MED ORDER — MENTHOL 3 MG MT LOZG
1.0000 | LOZENGE | OROMUCOSAL | Status: DC | PRN
  Filled 2020-07-22: qty 9

## 2020-07-22 MED ORDER — THROMBIN 5000 UNITS EX SOLR
CUTANEOUS | Status: AC
  Filled 2020-07-22: qty 5000

## 2020-07-22 MED ORDER — DEXAMETHASONE SODIUM PHOSPHATE 10 MG/ML IJ SOLN
INTRAMUSCULAR | Status: DC | PRN
  Administered 2020-07-22: 10 mg via INTRAVENOUS

## 2020-07-22 MED ORDER — OXYCODONE HCL 5 MG PO TABS: 5.0000 mg | ORAL_TABLET | Freq: Once | ORAL | Status: DC | PRN

## 2020-07-22 MED ORDER — CYCLOBENZAPRINE HCL 10 MG PO TABS
10.0000 mg | ORAL_TABLET | Freq: Three times a day (TID) | ORAL | Status: DC | PRN
  Administered 2020-07-22 – 2020-07-23 (×2): 10 mg via ORAL
  Filled 2020-07-22 (×2): qty 1

## 2020-07-22 MED ORDER — DILTIAZEM HCL 30 MG PO TABS
15.0000 mg | ORAL_TABLET | ORAL | Status: DC | PRN
  Filled 2020-07-22: qty 1

## 2020-07-22 MED ORDER — ACETAMINOPHEN 650 MG RE SUPP: 650.0000 mg | RECTAL | Status: DC | PRN

## 2020-07-22 MED ORDER — DEXAMETHASONE SODIUM PHOSPHATE 10 MG/ML IJ SOLN
INTRAMUSCULAR | Status: AC
  Filled 2020-07-22: qty 1

## 2020-07-22 MED ORDER — PHENOL 1.4 % MT LIQD
1.0000 | OROMUCOSAL | Status: DC | PRN
  Administered 2020-07-22: 1 via OROMUCOSAL
  Filled 2020-07-22: qty 177

## 2020-07-22 MED ORDER — ROCURONIUM BROMIDE 10 MG/ML (PF) SYRINGE
PREFILLED_SYRINGE | INTRAVENOUS | Status: AC
  Filled 2020-07-22: qty 10

## 2020-07-22 MED ORDER — OXYCODONE HCL 5 MG/5ML PO SOLN: 5.0000 mg | Freq: Once | ORAL | Status: DC | PRN

## 2020-07-22 MED ORDER — FLECAINIDE ACETATE 50 MG PO TABS
50.0000 mg | ORAL_TABLET | Freq: Two times a day (BID) | ORAL | Status: DC
  Administered 2020-07-22 – 2020-07-23 (×3): 50 mg via ORAL
  Filled 2020-07-22 (×3): qty 1

## 2020-07-22 MED ORDER — HYDROCODONE-ACETAMINOPHEN 10-325 MG PO TABS: 1.0000 | ORAL_TABLET | ORAL | Status: DC | PRN

## 2020-07-22 MED ORDER — POTASSIUM CHLORIDE CRYS ER 20 MEQ PO TBCR
20.0000 meq | EXTENDED_RELEASE_TABLET | Freq: Two times a day (BID) | ORAL | Status: DC
  Administered 2020-07-22 – 2020-07-23 (×2): 20 meq via ORAL
  Filled 2020-07-22 (×2): qty 1

## 2020-07-22 MED ORDER — LIDOCAINE 2% (20 MG/ML) 5 ML SYRINGE
INTRAMUSCULAR | Status: DC | PRN
  Administered 2020-07-22: 60 mg via INTRAVENOUS

## 2020-07-22 MED ORDER — HYDROCHLOROTHIAZIDE 25 MG PO TABS
25.0000 mg | ORAL_TABLET | Freq: Every day | ORAL | Status: DC
  Administered 2020-07-23: 25 mg via ORAL
  Filled 2020-07-22: qty 1

## 2020-07-22 MED ORDER — THROMBIN 20000 UNITS EX SOLR
CUTANEOUS | Status: AC
  Filled 2020-07-22: qty 20000

## 2020-07-22 MED ORDER — LACTATED RINGERS IV SOLN: INTRAVENOUS | Status: DC | PRN

## 2020-07-22 MED ORDER — PHENYLEPHRINE 40 MCG/ML (10ML) SYRINGE FOR IV PUSH (FOR BLOOD PRESSURE SUPPORT)
PREFILLED_SYRINGE | INTRAVENOUS | Status: AC
  Filled 2020-07-22: qty 10

## 2020-07-22 MED ORDER — VANCOMYCIN HCL IN DEXTROSE 1-5 GM/200ML-% IV SOLN
1000.0000 mg | INTRAVENOUS | Status: AC
  Administered 2020-07-22: 1000 mg via INTRAVENOUS
  Filled 2020-07-22: qty 200

## 2020-07-22 MED ORDER — ONDANSETRON HCL 4 MG/2ML IJ SOLN
INTRAMUSCULAR | Status: AC
  Filled 2020-07-22: qty 2

## 2020-07-22 MED ORDER — LIDOCAINE 2% (20 MG/ML) 5 ML SYRINGE
INTRAMUSCULAR | Status: AC
  Filled 2020-07-22: qty 5

## 2020-07-22 MED ORDER — SODIUM CHLORIDE 0.9 % IV SOLN: 250.0000 mL | INTRAVENOUS | Status: DC

## 2020-07-22 MED ORDER — ACETAMINOPHEN 10 MG/ML IV SOLN
INTRAVENOUS | Status: AC
  Filled 2020-07-22: qty 100

## 2020-07-22 MED ORDER — FENTANYL CITRATE (PF) 100 MCG/2ML IJ SOLN
INTRAMUSCULAR | Status: DC | PRN
  Administered 2020-07-22 (×2): 50 ug via INTRAVENOUS

## 2020-07-22 MED ORDER — VANCOMYCIN HCL 500 MG/100ML IV SOLN
500.0000 mg | Freq: Once | INTRAVENOUS | Status: AC
  Administered 2020-07-22: 500 mg via INTRAVENOUS
  Filled 2020-07-22: qty 100

## 2020-07-22 SURGICAL SUPPLY — 57 items
APL SKNCLS STERI-STRIP NONHPOA (GAUZE/BANDAGES/DRESSINGS) ×1
BAG DECANTER FOR FLEXI CONT (MISCELLANEOUS) ×3 IMPLANT
BAND INSRT 18 STRL LF DISP RB (MISCELLANEOUS) ×2
BAND RUBBER #18 3X1/16 STRL (MISCELLANEOUS) ×6 IMPLANT
BENZOIN TINCTURE PRP APPL 2/3 (GAUZE/BANDAGES/DRESSINGS) ×3 IMPLANT
BIT DRILL 13 (BIT) ×1 IMPLANT
BIT DRILL 13MM (BIT) ×1
BUR MATCHSTICK NEURO 3.0 LAGG (BURR) ×3 IMPLANT
CAGE PEEK 6X14X11 (Cage) ×9 IMPLANT
CANISTER SUCT 3000ML PPV (MISCELLANEOUS) ×3 IMPLANT
CARTRIDGE OIL MAESTRO DRILL (MISCELLANEOUS) ×1 IMPLANT
CLOSURE WOUND 1/2 X4 (GAUZE/BANDAGES/DRESSINGS) ×1
COVER WAND RF STERILE (DRAPES) ×3 IMPLANT
DIFFUSER DRILL AIR PNEUMATIC (MISCELLANEOUS) ×3 IMPLANT
DRAPE C-ARM 42X72 X-RAY (DRAPES) ×6 IMPLANT
DRAPE LAPAROTOMY 100X72 PEDS (DRAPES) ×3 IMPLANT
DRAPE MICROSCOPE LEICA (MISCELLANEOUS) ×3 IMPLANT
DURAPREP 6ML APPLICATOR 50/CS (WOUND CARE) ×3 IMPLANT
ELECT COATED BLADE 2.86 ST (ELECTRODE) ×3 IMPLANT
ELECT REM PT RETURN 9FT ADLT (ELECTROSURGICAL) ×3
ELECTRODE REM PT RTRN 9FT ADLT (ELECTROSURGICAL) ×1 IMPLANT
FLOSEAL 10ML (HEMOSTASIS) ×2 IMPLANT
GAUZE 4X4 16PLY RFD (DISPOSABLE) IMPLANT
GAUZE SPONGE 4X4 12PLY STRL (GAUZE/BANDAGES/DRESSINGS) ×3 IMPLANT
GLOVE ECLIPSE 9.0 STRL (GLOVE) ×3 IMPLANT
GLOVE EXAM NITRILE XL STR (GLOVE) IMPLANT
GOWN STRL REUS W/ TWL LRG LVL3 (GOWN DISPOSABLE) IMPLANT
GOWN STRL REUS W/ TWL XL LVL3 (GOWN DISPOSABLE) IMPLANT
GOWN STRL REUS W/TWL 2XL LVL3 (GOWN DISPOSABLE) IMPLANT
GOWN STRL REUS W/TWL LRG LVL3 (GOWN DISPOSABLE)
GOWN STRL REUS W/TWL XL LVL3 (GOWN DISPOSABLE)
HALTER HD/CHIN CERV TRACTION D (MISCELLANEOUS) ×3 IMPLANT
HEMOSTAT POWDER KIT SURGIFOAM (HEMOSTASIS) ×3 IMPLANT
KIT BASIN OR (CUSTOM PROCEDURE TRAY) ×3 IMPLANT
KIT TURNOVER KIT B (KITS) ×3 IMPLANT
NDL SPNL 20GX3.5 QUINCKE YW (NEEDLE) ×1 IMPLANT
NEEDLE SPNL 20GX3.5 QUINCKE YW (NEEDLE) ×3 IMPLANT
NS IRRIG 1000ML POUR BTL (IV SOLUTION) ×3 IMPLANT
OIL CARTRIDGE MAESTRO DRILL (MISCELLANEOUS) ×3
PACK LAMINECTOMY NEURO (CUSTOM PROCEDURE TRAY) ×3 IMPLANT
PAD ARMBOARD 7.5X6 YLW CONV (MISCELLANEOUS) ×9 IMPLANT
PLATE 3 57.5XLCK NS SPNE CVD (Plate) IMPLANT
PLATE 3 ATLANTIS TRANS (Plate) ×3 IMPLANT
SCREW ST FIX 4 ATL 3120213 (Screw) ×16 IMPLANT
SPACER SPNL 11X14X6XPEEK CVD (Cage) IMPLANT
SPCR SPNL 11X14X6XPEEK CVD (Cage) ×3 IMPLANT
SPONGE INTESTINAL PEANUT (DISPOSABLE) ×3 IMPLANT
SPONGE SURGIFOAM ABS GEL 100 (HEMOSTASIS) ×3 IMPLANT
STRIP CLOSURE SKIN 1/2X4 (GAUZE/BANDAGES/DRESSINGS) ×2 IMPLANT
SUT VIC AB 3-0 SH 8-18 (SUTURE) ×3 IMPLANT
SUT VIC AB 4-0 RB1 18 (SUTURE) ×3 IMPLANT
TAPE CLOTH 4X10 WHT NS (GAUZE/BANDAGES/DRESSINGS) ×3 IMPLANT
TAPE PAPER 3X10 WHT MICROPORE (GAUZE/BANDAGES/DRESSINGS) ×2 IMPLANT
TOWEL GREEN STERILE (TOWEL DISPOSABLE) ×3 IMPLANT
TOWEL GREEN STERILE FF (TOWEL DISPOSABLE) ×3 IMPLANT
TRAP SPECIMEN MUCUS 40CC (MISCELLANEOUS) ×3 IMPLANT
WATER STERILE IRR 1000ML POUR (IV SOLUTION) ×3 IMPLANT

## 2020-07-22 NOTE — Progress Notes (Signed)
Postop check.  Overall doing well status post 3 level anterior cervical decompression and fusion.  No upper extremity pain numbness or weakness.  Minimal neck discomfort.  Swallowing well.  Happy with progress.  Overall doing well.  Mobilize with probable discharge in morning.

## 2020-07-22 NOTE — Brief Op Note (Signed)
07/22/2020  10:27 AM  PATIENT:  Ashlee Flores  79 y.o. female  PRE-OPERATIVE DIAGNOSIS:  CERVICAL MYELOPATHY  POST-OPERATIVE DIAGNOSIS:  CERVICAL MYELOPATHY  PROCEDURE:  Procedure(s) with comments: Anterior Cervical Discectomy Fusion Cervical Four-Five, Cervical Five- Six, Cervical Six-Seven (N/A) - Anterior Cervical Discectomy Fusion Cervical Four-Five, Cervical Five- Six, Cervical Six-Seven  SURGEON:  Surgeon(s) and Role:    * Earnie Larsson, MD - Primary    * Ashok Pall, MD - Assisting  PHYSICIAN ASSISTANT:   ASSISTANTS:    ANESTHESIA:   general  EBL:  250 mL   BLOOD ADMINISTERED:none  DRAINS: none   LOCAL MEDICATIONS USED:  NONE  SPECIMEN:  No Specimen  DISPOSITION OF SPECIMEN:  N/A  COUNTS:  YES  TOURNIQUET:  * No tourniquets in log *  DICTATION: .Dragon Dictation  PLAN OF CARE: Admit to inpatient   PATIENT DISPOSITION:  PACU - hemodynamically stable.   Delay start of Pharmacological VTE agent (>24hrs) due to surgical blood loss or risk of bleeding: yes

## 2020-07-22 NOTE — Op Note (Signed)
Date of procedure: 07/22/2020  Date of dictation: Same  Service: Neurosurgery  Preoperative diagnosis: Cervical stenosis with myelopathy  Postoperative diagnosis: Same  Procedure Name: C4-5, C5-6, C6-7 anterior cervical discectomy with interbody fusion utilizing interbody cages, local harvested autograft, and anterior plate instrumentation  Surgeon:Ples Trudel A.Londan Coplen, M.D.  Asst. Surgeon: Christella Noa  Anesthesia: General  Indication: 79 year old female status post prior cardiac related syncopal episode with subsequent fall and incomplete spinal cord injury.  The patient has responded to immobilization and time but still has some lingering paresthesias and weakness in both upper extremities.  Work-up demonstrates evidence of severe stenosis at C4-5, C5-6 and C6-7.  Patient presents now for 3 level anterior cervical decompression and fusion in hopes of improving her symptoms.  Operative note: After induction of anesthesia, patient positioned supine with neck slightly extended held placed halter traction.  Patient's anterior cervical region prepped draped sterilely.  Incision made on the right side.  Dissection performed on the right.  Retractor placed.  Fluoroscopy used.  Levels confirmed.  The spaces incised.  Discectomy was performed using various instruments down to level of the posterior annulus.  Microscope was then brought to the field used throughout the remainder of the discectomy.  Remaining aspects of annulus and osteophytes removed using high-speed drill down to level of the posterior longitudinal ligament.  Posterior ligament was not elevated and resected.  Thecal sac was identified.  A wide central decompression was then performed undercutting the bodies of C4 and C5.  Decompression then proceeded each neural foramina.  Wide anterior foraminotomies performed on the course exiting C5 nerve roots bilaterally.  At this point a very thorough decompression had been achieved.  There was no evidence of  injury to the thecal sac and nerve roots.  Procedure then repeated at C5-6 and C6-7 again without complications.  [employer] anatomic peek cages were then packed with locally harvested autograft.  Each cage was then impacted into place at all 3 levels.  Each cage was recessed slightly from the anterior cortical margins.  Atlantis translational anterior cervical plate was then placed over the C4-C7 levels.  This then attached to fluoroscopic guidance using 13 mm fixed angle screws to each at all 4 levels.  All screws given final tightening found to be solidly within the bone.  Locking screws engaged all levels.  Final images reveal good position of the cages and the hardware at the proper upper level with normal alignment of spine.  Wound is then irrigated one final time.  Hemostasis was achieved with bipolar cautery.  Wounds were closed in layers of Vicryl sutures.  Steri-Strips and sterile dressing were applied.  No apparent complications.  Patient tolerated the procedure well and she returned to the recovery room postop.

## 2020-07-22 NOTE — H&P (Signed)
Ashlee Flores is an 79 y.o. female.   Chief Complaint: Weakness HPI: 79 year old female status post cardiac related syncope with subsequent fall and incomplete spinal cord injury.  The patient has made good improvement with time and activity restrictions.  Work-up is demonstrated evidence of marked cervical spondylosis with stenosis and cord compression.  Patient has residual numbness and tingling in both hands.  She has some loss of fine motor control and weakness in both hands.  Her gait is still somewhat unsteady.  Patient presents now for C4-5, C5-6 and C6-7 anterior cervical discectomy with interbody fusion and anterior plating in hopes of improving her symptoms.  Past Medical History:  Diagnosis Date  . Arthritis    NECK  . 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 Diastolic dysfunction 10/17/7822  . Dysrhythmia    PAF  . Essential hypertension 06/04/2016  . Family history of breast cancer   . Family history of ovarian cancer   . Palpitations 06/04/2016  . Paroxysmal atrial fibrillation ([place]) 08/22/2016    Past Surgical History:  Procedure Laterality Date  . BREAST EXCISIONAL BIOPSY Right   . BREAST SURGERY Right    LUMPECTOMY  . DILATION AND CURETTAGE OF UTERUS    . EYE SURGERY Left    CATARACT  . SALPINGOOPHORECTOMY     preventative, due to [place] of ovarian cancer    Family History  Problem Relation Age of Onset  . Breast cancer Mother 34  . Hypertension Mother   . Heart attack Father   . Ovarian cancer Sister 21  . Breast cancer Paternal Aunt        dx in her 42s  . Ovarian cancer Maternal Grandmother        died at 4  . Breast cancer Maternal Grandmother   . Melanoma Daughter 32       on leg  . Breast cancer Cousin        dx in her 5s  . Breast cancer Maternal Aunt 11  . Heart attack Maternal Uncle   . Bone cancer Paternal Uncle   . Heart attack Maternal Grandfather   . Heart attack Maternal Aunt   . Prostate cancer Cousin 92   Social History:   reports that she has never smoked. She has never used smokeless tobacco. She reports previous alcohol use. She reports that she does not use drugs.  Allergies:  Allergies  Allergen Reactions  . Penicillins Anaphylaxis    Medications Prior to Admission  Medication Sig Dispense Refill  . apixaban (ELIQUIS) 5 MG TABS tablet TAKE 1 TABLET (5 MG TOTAL) BY MOUTH 2 (TWO) TIMES DAILY. (Patient taking differently: Take 5 mg by mouth 2 (two) times daily. ) 180 tablet 3  . Calcium Carb-Cholecalciferol (CALCIUM 600/VITAMIN D3 PO) Take 1 tablet by mouth daily.    . Cholecalciferol (VITAMIN D) 50 MCG (2000 UT) CAPS Take 2,000 Units by mouth daily.    Marland Kitchen diltiazem (CARDIZEM) 30 MG tablet CARDIZEM 30MG  -- TAKE 1/2 TO 1 TABLET EVERY 4 HOURS AS NEEDED FOR AFIB HEART RATE >100 AS LONG AS TOP BLOOD PRESSURE >100. (Patient taking differently: Take 15-30 mg by mouth every 4 (four) hours as needed (afib heart rate > 100 as long as top blood pressure > 100). ) 45 tablet 1  . flecainide (TAMBOCOR) 50 MG tablet TAKE 1 TABLET (50 MG TOTAL) BY MOUTH EVERY 12 (TWELVE) HOURS. 60 tablet 10  . hydrochlorothiazide (HYDRODIURIL) 25 MG tablet Take 1  tablet (25 mg total) by mouth daily. 90 tablet 1  . lisinopril (PRINIVIL,ZESTRIL) 10 MG tablet Take 10 mg by mouth daily.     . potassium chloride SA (KLOR-CON) 20 MEQ tablet Take 1 tablet (20 mEq total) by mouth 2 (two) times daily. 180 tablet 3  . acetaminophen (TYLENOL) 500 MG tablet Take 500-1,000 mg by mouth every 6 (six) hours as needed for mild pain.  (Patient not taking: Reported on 07/14/2020)      Results for orders placed or performed during the hospital encounter of 07/22/20 (from the past 48 hour(s))  ABO/Rh     Status: None (Preliminary result)   Collection Time: 07/22/20  7:24 AM  Result Value Ref Range   ABO/RH(D) PENDING    No results found.  Pertinent items noted in HPI and remainder of comprehensive ROS otherwise negative.  Blood pressure (!) 149/74, pulse  (!) 59, temperature 97.8 F (36.6 C), temperature source Oral, resp. rate 18, SpO2 99 %.  Patient is awake and alert.  She is oriented and appropriate.  Speech is fluent.  Judgment insight are intact.  Cranial nerve function normal normal bilaterally.  Sensor examination with decrease sensation distally in both upper extremities.  Grip and intrinsic strength mildly weak and otherwise strength intact.  Deep tender versus hypoactive.  Hoffmann's responses in both hands.  Gait somewhat spastic peer examination head ears eyes nose throat is unremarkable chest and abdomen are benign.  Extremities are free from injury or deformity. Assessment/Plan Cervical stenosis with myelopathy.  Plan C4-5, C5-6, C6-7 anterior cervical segment interbody fusion utilize interbody cages, local harvested autograft, and anterior plate instrumentation.  Risks and benefits been explained.  Patient wishes to proceed.  Mallie Mussel A Dalma Panchal 07/22/2020, 8:00 AM

## 2020-07-22 NOTE — Transfer of Care (Signed)
Immediate Anesthesia Transfer of Care Note  Patient: Alzora Ha  Procedure(s) Performed: Anterior Cervical Discectomy Fusion Cervical Four-Five, Cervical Five- Six, Cervical Six-Seven (N/A )  Patient Location: PACU  Anesthesia Type:General  Level of Consciousness: oriented, drowsy and patient cooperative  Airway & Oxygen Therapy: Patient Spontanous Breathing and Patient connected to nasal cannula oxygen  Post-op Assessment: Report given to RN and Post -op Vital signs reviewed and stable  Post vital signs: Reviewed  Last Vitals:  Vitals Value Taken Time  BP 142/54 07/22/20 1043  Temp 36.7 C 07/22/20 1043  Pulse 69 07/22/20 1045  Resp 20 07/22/20 1045  SpO2 100 % 07/22/20 1045  Vitals shown include unvalidated device data.  Last Pain:  Vitals:   07/22/20 0731  TempSrc:   PainSc: 1       Patients Stated Pain Goal: 1 (55/97/41 [id number])  Complications: No complications documented.

## 2020-07-22 NOTE — Anesthesia Postprocedure Evaluation (Signed)
Anesthesia Post Note  Patient: Ashlee Flores  Procedure(s) Performed: Anterior Cervical Discectomy Fusion Cervical Four-Five, Cervical Five- Six, Cervical Six-Seven (N/A )     Patient location during evaluation: PACU Anesthesia Type: General Level of consciousness: awake and alert Pain management: pain level controlled Vital Signs Assessment: post-procedure vital signs reviewed and stable Respiratory status: spontaneous breathing, nonlabored ventilation and respiratory function stable Cardiovascular status: blood pressure returned to baseline and stable Postop Assessment: no apparent nausea or vomiting Anesthetic complications: no   No complications documented.  Last Vitals:  Vitals:   07/22/20 1130 07/22/20 1145  BP: (!) 127/59 124/65  Pulse: 62 63  Resp: (!) 21 (!) 22  Temp:    SpO2: 95% 96%    Last Pain:  Vitals:   07/22/20 1115  TempSrc:   PainSc: Ashlee Flores

## 2020-07-22 NOTE — Progress Notes (Signed)
Orthopedic Tech Progress Note Patient Details:  Ashlee Flores 12/11/1940 [id number]  Ortho Devices Type of Ortho Device: Soft collar Ortho Device/Splint Location: NECK Ortho Device/Splint Interventions: Adjustment   Post Interventions Patient Tolerated: Well Instructions Provided: Care of [place] 07/22/2020, 11:21 AM

## 2020-07-22 NOTE — Progress Notes (Signed)
Pharmacy Antibiotic Note  Ashlee Flores is a 79 y.o. female admitted on 07/22/2020 with  Cervical stenosis with myelopathy, s/p cervical decompression and fusion 11/12.  Pharmacy has been consulted for vancomycin dosing for surgical prophylaxis. Pre-op SCr 0.6 (from 11/9). Confirmed with RN - patient does not have a drain.  Pre-op vancomycin 1g IV dose give at 0744 this AM.  Plan: Vancomycin 500mg  IV x 1 to be given 12hrs post-op Monitor SCr Pharmacy will sign off consult and monitor peripherally    Temp (24hrs), Avg:98 F (36.7 C), Min:97.8 F (36.6 C), Max:98.1 F (36.7 C)  Recent Labs  Lab 07/19/20 0846  WBC 5.7  CREATININE 0.60    Estimated Creatinine Clearance: 45.8 mL/min (by C-G formula based on SCr of 0.6 mg/dL).    Allergies  Allergen Reactions  . Penicillins Anaphylaxis    Arturo Morton, PharmD, BCPS Please check AMION for all [place] contact numbers Clinical Pharmacist 07/22/2020 1:18 PM

## 2020-07-22 NOTE — Anesthesia Procedure Notes (Addendum)
Procedure Name: Intubation Date/Time: 07/22/2020 8:23 AM Performed by: Jenne Campus, CRNA Pre-anesthesia Checklist: Patient identified, Emergency Drugs available, Suction available and Patient being monitored Patient Re-evaluated:Patient Re-evaluated prior to induction Oxygen Delivery Method: Circle System Utilized Preoxygenation: Pre-oxygenation with 100% oxygen Induction Type: IV induction Ventilation: Mask ventilation without difficulty Laryngoscope Size: Glidescope and 3 Grade View: Grade I Tube type: Oral Tube size: 7.0 mm Number of attempts: 1 Airway Equipment and Method: Stylet and Oral airway Placement Confirmation: ETT inserted through vocal cords under direct vision,  positive ETCO2 and breath sounds checked- equal and bilateral Secured at: 21 cm Tube secured with: Tape Dental Injury: Teeth and Oropharynx as per pre-operative assessment

## 2020-07-23 DIAGNOSIS — Z7901 Long term (current) use of anticoagulants: Secondary | ICD-10-CM | POA: Diagnosis not present

## 2020-07-23 DIAGNOSIS — I10 Essential (primary) hypertension: Secondary | ICD-10-CM | POA: Diagnosis not present

## 2020-07-23 DIAGNOSIS — Z853 Personal history of malignant neoplasm of breast: Secondary | ICD-10-CM | POA: Diagnosis not present

## 2020-07-23 DIAGNOSIS — Z79899 Other long term (current) drug therapy: Secondary | ICD-10-CM | POA: Diagnosis not present

## 2020-07-23 DIAGNOSIS — M4712 Other spondylosis with myelopathy, cervical region: Secondary | ICD-10-CM | POA: Diagnosis not present

## 2020-07-23 DIAGNOSIS — I480 Paroxysmal atrial fibrillation: Secondary | ICD-10-CM | POA: Diagnosis not present

## 2020-07-23 MED ORDER — CYCLOBENZAPRINE HCL 10 MG PO TABS: 10.0000 mg | ORAL_TABLET | Freq: Three times a day (TID) | ORAL | 0 refills | Status: DC | PRN

## 2020-07-23 MED ORDER — MENTHOL 3 MG MT LOZG: 1.0000 | LOZENGE | OROMUCOSAL | 3 refills | Status: DC | PRN

## 2020-07-23 MED ORDER — TRAMADOL HCL 50 MG PO TABS
50.0000 mg | ORAL_TABLET | Freq: Four times a day (QID) | ORAL | 0 refills | Status: DC | PRN
Start: 2020-07-23 — End: 2020-09-22

## 2020-07-23 NOTE — Discharge Instructions (Signed)
Wound Care Keep incision area dry.  You may remove outer bandage after 2 days and shower.   If you shower prior cover incision with plastic wrap.  Do not put any creams, lotions, or ointments on incision. Leave steri-strips on neck.  They will fall off by themselves. Activity Walk each and every day, increasing distance each day. No lifting greater than 5 lbs.  Avoid excessive neck motion. No driving for 2 weeks; may ride as a passenger locally. Wear neck brace at all times except when showering. Diet Resume your normal diet.   Call Your Doctor If Any of These Occur Redness, drainage, or swelling at the wound.  Temperature greater than 101 degrees. Severe pain not relieved by pain medication. Increased difficulty swallowing.  Incision starts to come apart. Follow Up Appt Call  [phone])  for problems.  If you have any hardware placed in your spine, you will need an x-ray before your appointment.

## 2020-07-23 NOTE — Care Management Obs Status (Signed)
[place] NOTIFICATION   Patient Details  Name: Ashlee Flores MRN: [id number] Date of Birth: [birth year]   Medicare Observation Status Notification Given:  Yes    Carles Collet, RN 07/23/2020, 10:29 AM

## 2020-07-23 NOTE — Evaluation (Signed)
Occupational Therapy Evaluation and Discharge  Patient Details Name: Ashlee Flores MRN: [id number] DOB: 1940/11/02 Today's Date: 07/23/2020    History of Present Illness Pt is a 79 y/o female now s/p ACDF C4-5, 5-6, 6-7. PMHx includes HTN, breast CA, arthritis, Afib   Clinical Impression   This 79 y/o female presents with the above. PTA pt reports being independent with ADL and mobility tasks. OT eval completed today with no further acute OT needs. Pt performing room level mobility without AD and ADL tasks overall at supervision level. Educated both pt/pt's spouse re: cervical precautions, safety and compensatory techniques for completing ADL and mobility tasks with pt verbalizing and/or return demonstrating understanding. Pt requiring min cues for carryover of cervical precautions but with improvements noted as session progressed. She reports plans to return home with spouse assist PRN. All questions answered with no further acute OT needs identified at this time. Acute OT to sign off, thank you for this referral. Please re-consult should pt's needs change.     Follow Up Recommendations  No OT follow up;Supervision - Intermittent    Equipment Recommendations  None recommended by OT           Precautions / Restrictions Precautions Precautions: Cervical Precaution Booklet Issued: Yes (comment) Precaution Comments: issued and reviewed throughout session Restrictions Weight Bearing Restrictions: No      Mobility Bed Mobility Overal bed mobility: Needs Assistance Bed Mobility: Rolling;Sidelying to Sit;Sit to Sidelying Rolling: Supervision Sidelying to sit: Supervision     Sit to sidelying: Supervision General bed mobility comments: VCs for logroll technique, HOB elevated     Transfers Overall transfer level: Needs assistance Equipment used: None Transfers: Sit to/from Stand Sit to Stand: Supervision         General transfer comment: for balance and safety, no assist  needed. performed multiple sit<>stand throughout     Balance Overall balance assessment: No apparent balance deficits (not formally assessed)                                         ADL either performed or assessed with clinical judgement   ADL Overall ADL's : Needs assistance/impaired Eating/Feeding: Modified independent;Sitting   Grooming: Oral care;Supervision/safety;Standing Grooming Details (indicate cue type and reason): min cues for compensatory techniques  Upper Body Bathing: Supervision/ safety;Standing   Lower Body Bathing: Min guard;Sit to/from stand   Upper Body Dressing : Supervision/safety;Standing   Lower Body Dressing: Min guard;Sit to/from stand Lower Body Dressing Details (indicate cue type and reason): min cues for precautions, donning pants and underwear  Toilet Transfer: Supervision/safety;Ambulation   Toileting- Clothing Manipulation and Hygiene: Supervision/safety;Sit to/from stand       Functional mobility during ADLs: Supervision/safety General ADL Comments: min cues for adhering to cervical precautions throughout      Vision         Perception     Praxis      Pertinent Vitals/Pain Pain Assessment: Faces Faces Pain Scale: Hurts a little bit Pain Location: incisional Pain Descriptors / Indicators: Discomfort;Guarding Pain Intervention(s): Monitored during session;Repositioned     Hand Dominance Right   Extremity/Trunk Assessment Upper Extremity Assessment Upper Extremity Assessment: Overall WFL for tasks assessed (reports sensation much improved, fine motor WFL)   Lower Extremity Assessment Lower Extremity Assessment: Overall WFL for tasks assessed   Cervical / Trunk Assessment Cervical / Trunk Assessment: Other exceptions Cervical / Trunk Exceptions: s/p  cervical sx   Communication Communication Communication: No difficulties   Cognition Arousal/Alertness: Awake/alert Behavior During Therapy: WFL for tasks  assessed/performed Overall Cognitive Status: Within Functional Limits for tasks assessed                                     General Comments       Exercises     Shoulder Instructions      Home Living Family/patient expects to be discharged to:: Private residence Living Arrangements: Spouse/significant other Available Help at Discharge: Family Type of Home: House Home Access: Stairs to enter [employer] of Steps: 2 at garage, 4 at front Entrance Stairs-Rails: Left (at front, none at garage) Home Layout: Two level;Able to live on main level with bedroom/bathroom Alternate Level Stairs-Number of Steps: flight Alternate Level Stairs-Rails: Left Bathroom Shower/Tub: Occupational psychologist:  (comfort height)     Home Equipment: None          Prior Functioning/Environment Level of Independence: Independent                 OT Problem List: Decreased range of motion;Impaired UE functional use;Pain;Decreased knowledge of precautions      OT Treatment/Interventions:      OT Goals(Current goals can be found in the care plan section) Acute Rehab OT Goals Patient Stated Goal: home when able OT Goal Formulation: All assessment and education complete, DC therapy  OT Frequency:     Barriers to D/C:            Co-evaluation              AM-PAC OT "6 Clicks" Daily Activity     Outcome Measure Help from another person eating meals?: None Help from another person taking care of personal grooming?: A Little Help from another person toileting, which includes using toliet, bedpan, or urinal?: A Little Help from another person bathing (including washing, rinsing, drying)?: A Little Help from another person to put on and taking off regular upper body clothing?: A Little Help from another person to put on and taking off regular lower body clothing?: A Little 6 Click Score: 19   End of Session Equipment Utilized During Treatment:  Cervical collar Nurse Communication: Mobility status  Activity Tolerance: Patient tolerated treatment well Patient left: in bed;with call bell/phone within reach;with family/visitor present;with nursing/sitter in room (seated EOB)  OT Visit Diagnosis: Other abnormalities of gait and mobility (R26.89)                Time: 4193-7902 OT Time Calculation (min): 23 min Charges:  OT General Charges $OT Visit: 1 Visit OT Evaluation $OT Eval Low Complexity: 1 Low OT Treatments $Self Care/Home Management : 8-22 mins  Lou Cal, OT Acute Rehabilitation Services Pager [phone] Office [phone]    Raymondo Band 07/23/2020, 9:12 AM

## 2020-07-23 NOTE — Care Management CC44 (Signed)
Condition Code 44 Documentation Completed  Patient Details  Name: Vennesa Bastedo MRN: [id number] Date of Birth: June 29, 1941   Condition Code 44 given:  Yes Patient signature on Condition Code 44 notice:  Yes Documentation of 2 MD's agreement:  Yes Code 44 added to claim:  Yes    Carles Collet, RN 07/23/2020, 10:29 AM

## 2020-07-23 NOTE — Plan of Care (Signed)
Patient alert and oriented, mae's well, voiding adequate amount of urine, swallowing without difficulty, no c/o pain at time of discharge. Patient discharged home with family. Script and discharged instructions given to patient. Patient and family stated understanding of instructions given. Patient has an appointment with Dr. Pool  

## 2020-07-23 NOTE — Discharge Summary (Signed)
  Physician Discharge Summary  Patient ID: Ashlee Flores MRN: [id number] DOB/AGE: [birth year] 79 y.o.  Admit date: 07/22/2020 Discharge date: 07/23/2020  Admission Diagnoses:  Cervical myelopathy  Discharge Diagnoses:  Same Active Problems:   Cervical myelopathy [facility])   Discharged Condition: Stable  Hospital Course:  Ashlee Flores is a 79 y.o. female with history of breast cancer who underwent C4-5, C5-6, and C6-7 ACDF for cervical myelopathy on 07/22/2020.  Postoperatively, she was admitted to the spine unit where she was mobilized with the help of therapy.  The day of discharge, she had mild dysphonia and dysphagia but was tolerating a regular diet, ambulating without difficulty, and voiding without difficulty.  She was deemed ready for discharge home on 07/23/2020  Treatments: Surgery -C4-5, C5-6, and C6-7 ACDF  Discharge Exam: Blood pressure (!) 141/55, pulse 64, temperature 98.5 F (36.9 C), temperature source Oral, resp. rate (!) 8, SpO2 100 %. Awake, alert, oriented Speech fluent, appropriate CN grossly intact 5/5 BUE/BLE Wound c/d/i  Disposition: Discharge disposition: 01-Home or Self Care          Follow-up Information    Earnie Larsson, MD Follow up.   Specialty: Neurosurgery Contact information: [address] [place] [address] [place] [address] [phone]               Signed: Vallarie Mare 07/23/2020, 9:57 AM

## 2020-07-25 ENCOUNTER — Encounter (HOSPITAL_COMMUNITY): Payer: Self-pay | Admitting: Neurosurgery

## 2020-08-01 ENCOUNTER — Ambulatory Visit (INDEPENDENT_AMBULATORY_CARE_PROVIDER_SITE_OTHER): Payer: Medicare Other | Admitting: Cardiovascular Disease

## 2020-08-01 ENCOUNTER — Other Ambulatory Visit: Payer: Self-pay

## 2020-08-01 ENCOUNTER — Encounter: Payer: Self-pay | Admitting: Cardiovascular Disease

## 2020-08-01 VITALS — BP 132/74 | HR 70 | Ht 63.0 in | Wt 125.0 lb

## 2020-08-01 DIAGNOSIS — G959 Disease of spinal cord, unspecified: Secondary | ICD-10-CM | POA: Diagnosis not present

## 2020-08-01 DIAGNOSIS — I10 Essential (primary) hypertension: Secondary | ICD-10-CM | POA: Diagnosis not present

## 2020-08-01 DIAGNOSIS — I480 Paroxysmal atrial fibrillation: Secondary | ICD-10-CM | POA: Diagnosis not present

## 2020-08-01 NOTE — Patient Instructions (Signed)
Medication Instructions:  Your physician recommends that you continue on your current medications as directed. Please refer to the Current Medication list given to you today.  *If you need a refill on your cardiac medications before your next appointment, please call your pharmacy*  Lab Work: NONE  Testing/Procedures: NONE  Follow-Up: At [facility], you and your health needs are our priority.  As part of our continuing mission to provide you with exceptional heart care, we have created designated Provider Care Teams.  These Care Teams include your primary Cardiologist (physician) and Advanced Practice Providers (APPs -  Physician Assistants and Nurse Practitioners) who all work together to provide you with the care you need, when you need it.  We recommend signing up for the patient portal called "MyChart".  Sign up information is provided on this After Visit Summary.  MyChart is used to connect with patients for Virtual Visits (Telemedicine).  Patients are able to view lab/test results, encounter notes, upcoming appointments, etc.  Non-urgent messages can be sent to your provider as well.   To learn more about what you can do with MyChart, go to [email or web address].    Your next appointment:   6 month(s)  The format for your next appointment:   In Person  Provider:   You may see Tiffany Yznaga, MD or one of the following Advanced Practice Providers on your designated Care Team:    Luke Kilroy, PA-C  Callie Goodrich, PA-C  Jesse Cleaver, FNP   

## 2020-08-01 NOTE — Progress Notes (Signed)
Cardiology Office Note   Date:  08/01/2020   ID:  Ashlee Flores, DOB [birth year], MRN [id number]  PCP:  Jonathon Jordan, MD  Cardiologist:   Skeet Latch, MD   No chief complaint on file.   History of Present Illness: Ashlee Flores is a 79 y.o. female with paroxysmal atrial fibrillation, grade 2 diastolic dysfunction, and hypertension who presents for follow-up. Ashlee Flores saw Dr. Jonathon Jordan on 04/16/16. At that time she reported an episode of irregular heart rate that occurred while resting.  She wore a 30 day event monitor 07/2016 that showed atrial fibrillation with rates up to 160 bpm. Thyroid function and basic metabolic panel were normal.  She was started on Eliquis but takes metoprolol as needed due to baseline bradycardia. She had an echo 09/17/16 that revealed LVEF 55-60% with grade 2 diastolic dysfunction and mild aortic regurgitation.  PASP was mildly elevated at 44 mmHg.   Ashlee Flores has been doing well.  She had anterior cervical discectomy and fusion on 07/2020.  Surgery went well but she is disappointed that she continues to have shooting pains in bilateral arms.  She has no pain in her neck.  She hasn't had any atrial fibrillation since 05/2020.  She has occasional flutters for 1-2 beats more so when she is going up stairs.  She isn't drinking any more wine and has been taking potassium regularly.  She has been trying to maintain hydration and keeping her stress levels down.  She has been taking her potassium more regularly and thinks that this is helping as well.  She has been exercising by walking.  She has no exertional chest pain or shortness of breath.  She has no edema, orthopnea or PND.  She has not needed to use any diltiazem which she has as needed.  She saw Dr. Quentin Ore and was considered a candidate for ablation.  However given that she has not had any recurrent atrial fibrillation she wants to continue waiting for now.   Past Medical History:  Diagnosis Date    . Arthritis    NECK  . 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 Diastolic dysfunction 04/14/5363  . Dysrhythmia    PAF  . Essential hypertension 06/04/2016  . Family history of breast cancer   . Family history of ovarian cancer   . Palpitations 06/04/2016  . Paroxysmal atrial fibrillation ([place]) 08/22/2016    Past Surgical History:  Procedure Laterality Date  . ANTERIOR CERVICAL DECOMP/DISCECTOMY FUSION N/A 07/22/2020   Procedure: Anterior Cervical Discectomy Fusion Cervical Four-Five, Cervical Five- Six, Cervical Six-Seven;  Surgeon: Earnie Larsson, MD;  Location: [place];  Service: Neurosurgery;  Laterality: N/A;  Anterior Cervical Discectomy Fusion Cervical Four-Five, Cervical Five- Six, Cervical Six-Seven  . BREAST EXCISIONAL BIOPSY Right   . BREAST SURGERY Right    LUMPECTOMY  . DILATION AND CURETTAGE OF UTERUS    . EYE SURGERY Left    CATARACT  . SALPINGOOPHORECTOMY     preventative, due to [place] of ovarian cancer     Current Outpatient Medications  Medication Sig Dispense Refill  . acetaminophen (TYLENOL) 500 MG tablet Take 500-1,000 mg by mouth every 6 (six) hours as needed for mild pain.     Marland Kitchen apixaban (ELIQUIS) 5 MG TABS tablet TAKE 1 TABLET (5 MG TOTAL) BY MOUTH 2 (TWO) TIMES DAILY. (Patient taking differently: Take 5 mg by mouth 2 (two) times daily. ) 180 tablet 3  . Calcium Carb-Cholecalciferol (CALCIUM  600/VITAMIN D3 PO) Take 1 tablet by mouth daily.    . Cholecalciferol (VITAMIN D) 50 MCG (2000 UT) CAPS Take 2,000 Units by mouth daily.    Marland Kitchen diltiazem (CARDIZEM) 30 MG tablet CARDIZEM 30MG  -- TAKE 1/2 TO 1 TABLET EVERY 4 HOURS AS NEEDED FOR AFIB HEART RATE >100 AS LONG AS TOP BLOOD PRESSURE >100. (Patient taking differently: Take 15-30 mg by mouth every 4 (four) hours as needed (afib heart rate > 100 as long as top blood pressure > 100). ) 45 tablet 1  . flecainide (TAMBOCOR) 50 MG tablet TAKE 1 TABLET (50 MG TOTAL) BY MOUTH EVERY 12 (TWELVE) HOURS. 60 tablet 10  .  hydrochlorothiazide (HYDRODIURIL) 25 MG tablet Take 1 tablet (25 mg total) by mouth daily. 90 tablet 1  . lisinopril (PRINIVIL,ZESTRIL) 10 MG tablet Take 10 mg by mouth daily.     Marland Kitchen menthol-cetylpyridinium (CEPACOL) 3 MG lozenge Take 1 lozenge (3 mg total) by mouth as needed for sore throat. 60 tablet 3  . potassium chloride SA (KLOR-CON) 20 MEQ tablet Take 1 tablet (20 mEq total) by mouth 2 (two) times daily. 180 tablet 3  . cyclobenzaprine (FLEXERIL) 10 MG tablet Take 1 tablet (10 mg total) by mouth 3 (three) times daily as needed for muscle spasms. (Patient not taking: Reported on 08/01/2020) 30 tablet 0  . traMADol (ULTRAM) 50 MG tablet Take 1 tablet (50 mg total) by mouth every 6 (six) hours as needed for moderate pain or severe pain. (Patient not taking: Reported on 08/01/2020) 20 tablet 0   No current facility-administered medications for this visit.    Allergies:   Penicillins    Social History:  The patient  reports that she has never smoked. She has never used smokeless tobacco. She reports previous alcohol use. She reports that she does not use drugs.   Family History:  The patient's family history includes Bone cancer in her paternal uncle; Breast cancer in her cousin, maternal grandmother, and paternal aunt; Breast cancer (age of onset: 84) in her maternal aunt; Breast cancer (age of onset: 71) in her mother; Heart attack in her father, maternal aunt, maternal grandfather, and maternal uncle; Hypertension in her mother; Melanoma (age of onset: 92) in her daughter; Ovarian cancer in her maternal grandmother; Ovarian cancer (age of onset: 23) in her sister; Prostate cancer (age of onset: 20) in her cousin.    ROS:  Please see the history of present illness.   Otherwise, review of systems are positive for none.   All other systems are reviewed and negative.    PHYSICAL EXAM: VS:  BP 132/74   Pulse 70   Ht 5\' 3"  (1.6 m)   Wt 125 lb (56.7 kg)   SpO2 98%   BMI 22.14 kg/m  , BMI  Body mass index is 22.14 kg/m. GENERAL:  Well appearing HEENT: Pupils equal round and reactive, fundi not visualized, oral mucosa unremarkable NECK: Neck brace in place LUNGS:  Clear to auscultation bilaterally HEART:  RRR.  PMI not displaced or sustained,S1 and S2 within normal limits, no S3, no S4, no clicks, no rubs, no murmurs ABD:  Flat, positive bowel sounds normal in frequency in pitch, no bruits, no rebound, no guarding, no midline pulsatile mass, no hepatomegaly, no splenomegaly EXT:  2 plus pulses throughout, no edema, no cyanosis no clubbing SKIN:  No rashes no nodules NEURO:  Cranial nerves II through XII grossly intact, motor grossly intact throughout PSYCH:  Cognitively intact, oriented to person  place and time   EKG:  EKG is not ordered today. The ekg ordered 06/04/16 demonstrates sinus bradycardia rate 58 bpm.  10/12/16: Sinus bradycardia.  Rate 57 bpm.  03/26/17: Sinus bradycardia. Rate 54 bpm. 05/14/2018: Sinus bradycardia.  Rate 57 bpm. 08/27/19: Sinus rhythm.  Rate 62 bpm.  30 Day Event Monitor 08/22/16:  Quality: Fair.  Baseline artifact. Predominant rhythm: Sinus bradycardia  Max heart rate: 160 bpm Min heart rate: 45 bpm Atrial fibrillation was noted with rates up to 160 bpm.  Echo 09/17/16: Study Conclusions  - Left ventricle: The cavity size was normal. Wall thickness was   normal. Systolic function was normal. The estimated ejection   fraction was in the range of 55% to 60%. Wall motion was normal;   there were no regional wall motion abnormalities. Features are   consistent with a pseudonormal left ventricular filling pattern,   with concomitant abnormal relaxation and increased filling   pressure (grade 2 diastolic dysfunction). - Aortic valve: There was no stenosis. There was mild   regurgitation. - Mitral valve: There was trivial regurgitation. - Left atrium: The atrium was mildly dilated. - Right ventricle: The cavity size was normal. Systolic  function   was normal. - Tricuspid valve: Peak RV-RA gradient (S): 29 mm Hg. - Pulmonary arteries: PA peak pressure: 44 mm Hg (S). - Systemic veins: IVC measured 2.2 cm with < 50% respirophasic   variation, suggesting RA pressure 15 mmHg.  Impressions:  - Normal LV size with EF 55-60%. Moderate diastolic dysfunction.   Normal RV size and systolic function. Mild aortic insufficiency.   Mild pulmonary hypertension.   Recent Labs: 03/29/2020: ALT 20; Magnesium 1.6 07/19/2020: BUN 9; Creatinine, Ser 0.60; Hemoglobin 14.5; Platelets 233; Potassium 3.5; Sodium 137   04/16/16: WBC 4.6, hemoglobin 14.6, hematocrit 42, platelets 212  12/06/16: Sodium 136, potassium 3.2, BUN 15, creatinine 0.79 AST 17, ALT 17 WBC 5.8, hemoglobin 13.8, hematocrit 38.8, platelets 227  Lipid Panel    Component Value Date/Time   CHOL 189 08/31/2019 1030   TRIG 73 08/31/2019 1030   HDL 84 08/31/2019 1030   CHOLHDL 2.3 08/31/2019 1030   LDLCALC 92 08/31/2019 1030      Wt Readings from Last 3 Encounters:  08/01/20 125 lb (56.7 kg)  07/19/20 127 lb 1 oz (57.6 kg)  06/14/20 130 lb (59 kg)      ASSESSMENT AND PLAN:  # Paroxysmal atrial fibrillation:  Overall atrial fibrillation is well-controlled.  She did not tolerate metoprolol due to bradycardia.  Flecainide was not tolerated at higher doses.  Lately it seems to be well-controlled.  She has been seen by EP and is deemed to be a candidate for ablation.  However given that her symptoms are pretty well controlled she would like to continue with medical management for now.  Continue Eliquis and flecainide.  She has diltiazem to be taken as needed for paroxysms of atrial fibrillation that are persistent.  This patients CHA2DS2-VASc Score and unadjusted Ischemic Stroke Rate (% per year) is equal to 3.2 % stroke rate/year from a score of 3  Above score calculated as 1 point each if present [CHF, HTN, DM, Vascular=MI/PAD/Aortic Plaque, Age if 65-74, or  Female] Above score calculated as 2 points each if present [Age > 75, or Stroke/TIA/TE]  # Hypertension: Blood pressure is nearly at goal.  She is in the process of increasing her exercise.  Continue HCTZ and lisinopril.  She understands the importance of her potassium supplementation.  #  CV Disease Prevention: Continue daily exercise.  LDL was 90 on 11/2019.  Current medicines are reviewed at length with the patient today.  The patient does not have concerns regarding medicines.  The following changes have been made:  none  Labs/ tests ordered today include:   No orders of the defined types were placed in this encounter.    Disposition:   FU with [place] Skalicky C. Oval Linsey, MD, [facility] in 6 months.     Signed, Ceceilia Cephus C. Oval Linsey, MD, [facility]  08/01/2020 12:08 PM    [place]

## 2020-08-17 ENCOUNTER — Telehealth: Payer: Self-pay | Admitting: Cardiovascular Disease

## 2020-08-17 NOTE — Telephone Encounter (Signed)
*  STAT* If patient is at the pharmacy, call can be transferred to refill team.   1. Which medications need to be refilled? (please list name of each medication and dose if known) flecainide (TAMBOCOR) 50 MG tablet  2. Which pharmacy/location (including street and city if local pharmacy) is medication to be sent to? [id number] - [place], [place] - [address] AT [place]  3. Do they need a 30 day or 90 day supply? 90 day supply

## 2020-08-18 MED ORDER — FLECAINIDE ACETATE 50 MG PO TABS
50.0000 mg | ORAL_TABLET | Freq: Two times a day (BID) | ORAL | 3 refills | Status: DC
Start: 2020-08-18 — End: 2021-08-18

## 2020-08-31 DIAGNOSIS — I10 Essential (primary) hypertension: Secondary | ICD-10-CM | POA: Diagnosis not present

## 2020-08-31 DIAGNOSIS — G959 Disease of spinal cord, unspecified: Secondary | ICD-10-CM | POA: Diagnosis not present

## 2020-09-22 ENCOUNTER — Ambulatory Visit (INDEPENDENT_AMBULATORY_CARE_PROVIDER_SITE_OTHER): Payer: Medicare Other | Admitting: Cardiology

## 2020-09-22 ENCOUNTER — Other Ambulatory Visit: Payer: Self-pay

## 2020-09-22 ENCOUNTER — Encounter: Payer: Self-pay | Admitting: Cardiology

## 2020-09-22 VITALS — BP 148/72 | HR 54 | Ht 63.0 in | Wt 126.6 lb

## 2020-09-22 DIAGNOSIS — I10 Essential (primary) hypertension: Secondary | ICD-10-CM

## 2020-09-22 DIAGNOSIS — I480 Paroxysmal atrial fibrillation: Secondary | ICD-10-CM

## 2020-09-22 NOTE — Patient Instructions (Addendum)
Medication Instructions:  Your physician recommends that you continue on your current medications as directed. Please refer to the Current Medication list given to you today.  Labwork: None ordered.  Testing/Procedures: None ordered.  Follow-Up: Your physician wants you to follow-up in: 6 months with Dr. Lambert.   You will receive a reminder letter in the mail two months in advance. If you don't receive a letter, please call our office to schedule the follow-up appointment.   Any Other Special Instructions Will Be Listed Below (If Applicable).  If you need a refill on your cardiac medications before your next appointment, please call your pharmacy.   

## 2020-09-22 NOTE — Progress Notes (Signed)
Electrophysiology Office Follow up Visit Note:    Date:  09/22/2020   ID:  Ashlee Flores, DOB 04-21-1941, MRN [id number]  PCP:  Jonathon Jordan, MD  [facility] Cardiologist:  Skeet Latch, MD  [facility] Electrophysiologist:  Vickie Epley, MD    Interval History:    Ashlee Flores is a 80 y.o. female who presents for a follow up visit.  I last saw the patient on June 14, 2020.  At that appointment we discussed episodes of symptomatic atrial fibrillation that were being managed with flecainide and diltiazem.  She underwent a cervical discectomy on July 22, 2020 by Dr. Annette Stable.  She saw Dr. Oval Linsey in follow-up on August 01, 2020 and reported doing well from an atrial fibrillation perspective.  She not had recurrence of her atrial arrhythmias on her medical therapy.       Past Medical History:  Diagnosis Date  . Arthritis    NECK  . 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 Diastolic dysfunction 02/09/3761  . Dysrhythmia    PAF  . Essential hypertension 06/04/2016  . Family history of breast cancer   . Family history of ovarian cancer   . Palpitations 06/04/2016  . Paroxysmal atrial fibrillation ([place]) 08/22/2016    Past Surgical History:  Procedure Laterality Date  . ANTERIOR CERVICAL DECOMP/DISCECTOMY FUSION N/A 07/22/2020   Procedure: Anterior Cervical Discectomy Fusion Cervical Four-Five, Cervical Five- Six, Cervical Six-Seven;  Surgeon: Earnie Larsson, MD;  Location: [place];  Service: Neurosurgery;  Laterality: N/A;  Anterior Cervical Discectomy Fusion Cervical Four-Five, Cervical Five- Six, Cervical Six-Seven  . BREAST EXCISIONAL BIOPSY Right   . BREAST SURGERY Right    LUMPECTOMY  . DILATION AND CURETTAGE OF UTERUS    . EYE SURGERY Left    CATARACT  . SALPINGOOPHORECTOMY     preventative, due to [place] of ovarian cancer    Current Medications: Current Meds  Medication Sig  . acetaminophen (TYLENOL) 500 MG tablet Take 500-1,000 mg by mouth  every 6 (six) hours as needed for mild pain.   Marland Kitchen apixaban (ELIQUIS) 5 MG TABS tablet TAKE 1 TABLET (5 MG TOTAL) BY MOUTH 2 (TWO) TIMES DAILY.  . Calcium Carb-Cholecalciferol (CALCIUM 600/VITAMIN D3 PO) Take 1 tablet by mouth daily.  . Cholecalciferol (VITAMIN D) 50 MCG (2000 UT) CAPS Take 2,000 Units by mouth daily.  Marland Kitchen diltiazem (CARDIZEM) 30 MG tablet CARDIZEM 30MG  -- TAKE 1/2 TO 1 TABLET EVERY 4 HOURS AS NEEDED FOR AFIB HEART RATE >100 AS LONG AS TOP BLOOD PRESSURE >100. (Patient taking differently: Cardizem 30mg  -- take 1/2 to 1 tablet every 4 hours AS NEEDED for afib  heart rate >100 as long as top blood pressure >100.)  . flecainide (TAMBOCOR) 50 MG tablet Take 1 tablet (50 mg total) by mouth every 12 (twelve) hours.  . hydrochlorothiazide (HYDRODIURIL) 25 MG tablet Take 1 tablet (25 mg total) by mouth daily.  Marland Kitchen lisinopril (PRINIVIL,ZESTRIL) 10 MG tablet Take 10 mg by mouth daily.   Marland Kitchen menthol-cetylpyridinium (CEPACOL) 3 MG lozenge Take 1 lozenge (3 mg total) by mouth as needed for sore throat.  . potassium chloride SA (KLOR-CON) 20 MEQ tablet Take 1 tablet (20 mEq total) by mouth 2 (two) times daily.     Allergies:   Penicillins   Social History   Socioeconomic History  . Marital status: Married    Spouse name: Not on file  . Number of children: 4  . Years of education: Not  on file  . Highest education level: Not on file  Occupational History  . Not on file  Tobacco Use  . Smoking status: Never Smoker  . Smokeless tobacco: Never Used  Vaping Use  . Vaping Use: Never used  Substance and Sexual Activity  . Alcohol use: Not Currently  . Drug use: No  . Sexual activity: Not on file  Other Topics Concern  . Not on file  Social History Narrative  . Not on file   Social Determinants of Health   Financial Resource Strain: Not on file  Food Insecurity: Not on file  Transportation Needs: Not on file  Physical Activity: Not on file  Stress: Not on file  Social Connections: Not  on file     Family History: The patient's family history includes Bone cancer in her paternal uncle; Breast cancer in her cousin, maternal grandmother, and paternal aunt; Breast cancer (age of onset: 60) in her maternal aunt; Breast cancer (age of onset: 19) in her mother; Heart attack in her father, maternal aunt, maternal grandfather, and maternal uncle; Hypertension in her mother; Melanoma (age of onset: 81) in her daughter; Ovarian cancer in her maternal grandmother; Ovarian cancer (age of onset: 32) in her sister; Prostate cancer (age of onset: 16) in her cousin.  ROS:   Please see the history of present illness.    All other systems reviewed and are negative.  EKGs/Labs/Other Studies Reviewed:    The following studies were reviewed today:   EKG:  The ekg ordered today demonstrates sinus bradycardia.  PR interval 144 ms.  QTc 396 ms.  Recent Labs: 03/29/2020: ALT 20; Magnesium 1.6 07/19/2020: BUN 9; Creatinine, Ser 0.60; Hemoglobin 14.5; Platelets 233; Potassium 3.5; Sodium 137  Recent Lipid Panel    Component Value Date/Time   CHOL 189 08/31/2019 1030   TRIG 73 08/31/2019 1030   HDL 84 08/31/2019 1030   CHOLHDL 2.3 08/31/2019 1030   LDLCALC 92 08/31/2019 1030    Physical Exam:    VS:  BP (!) 148/72   Pulse (!) 54   Ht 5\' 3"  (1.6 m)   Wt 126 lb 9.6 oz (57.4 kg)   SpO2 98%   BMI 22.43 kg/m     Wt Readings from Last 3 Encounters:  09/22/20 126 lb 9.6 oz (57.4 kg)  08/01/20 125 lb (56.7 kg)  07/19/20 127 lb 1 oz (57.6 kg)     GEN:  Well nourished, well developed in no acute distress HEENT: Normal NECK: No JVD; No carotid bruits LYMPHATICS: No lymphadenopathy CARDIAC: RRR, no murmurs, rubs, gallops RESPIRATORY:  Clear to auscultation without rales, wheezing or rhonchi  ABDOMEN: Soft, non-tender, non-distended MUSCULOSKELETAL:  No edema; No deformity  SKIN: Warm and dry NEUROLOGIC:  Alert and oriented x 3 PSYCHIATRIC:  Normal affect   ASSESSMENT:    1. PAF  (paroxysmal atrial fibrillation) ([place])   2. Essential hypertension    PLAN:    In order of problems listed above:  1. Symptomatic paroxysmal atrial fibrillation Currently managed with flecainide and diltiazem.  Maintaining normal rhythm.  Feeling well.  At this point, would favor continuing the current regimen.  I will plan on seeing her back in about 6 months to see how she is doing on the flecainide.  2.  Hypertension Slightly above goal during today's visit.  The patient tells me she has some whitecoat hypertension.  I recommend she monitor her pressures at home several times per week and let us know if her  systolic blood pressures are consistently greater than 140 mmHg.    Medication Adjustments/Labs and Tests Ordered: Current medicines are reviewed at length with the patient today.  Concerns regarding medicines are outlined above.  Orders Placed This Encounter  Procedures  . EKG 12-Lead   No orders of the defined types were placed in this encounter.    Signed, Lars Mage, MD, [facility]  09/22/2020 4:59 PM    Electrophysiology [facility]

## 2020-09-29 DIAGNOSIS — G959 Disease of spinal cord, unspecified: Secondary | ICD-10-CM | POA: Diagnosis not present

## 2020-10-06 DIAGNOSIS — K219 Gastro-esophageal reflux disease without esophagitis: Secondary | ICD-10-CM | POA: Diagnosis not present

## 2020-10-10 ENCOUNTER — Other Ambulatory Visit: Payer: Self-pay | Admitting: Obstetrics & Gynecology

## 2020-10-10 DIAGNOSIS — Z1231 Encounter for screening mammogram for malignant neoplasm of breast: Secondary | ICD-10-CM

## 2020-10-11 ENCOUNTER — Ambulatory Visit
Admission: RE | Admit: 2020-10-11 | Discharge: 2020-10-11 | Disposition: A | Discharge status: Home / Self Care | Payer: Medicare Other | Source: Ambulatory Visit | Attending: Obstetrics & Gynecology | Admitting: Obstetrics & Gynecology

## 2020-10-11 ENCOUNTER — Other Ambulatory Visit: Payer: Self-pay

## 2020-10-11 DIAGNOSIS — Z1231 Encounter for screening mammogram for malignant neoplasm of breast: Secondary | ICD-10-CM | POA: Diagnosis not present

## 2020-11-01 DIAGNOSIS — E559 Vitamin D deficiency, unspecified: Secondary | ICD-10-CM | POA: Diagnosis not present

## 2020-11-01 DIAGNOSIS — F3341 Major depressive disorder, recurrent, in partial remission: Secondary | ICD-10-CM | POA: Diagnosis not present

## 2020-11-01 DIAGNOSIS — R7301 Impaired fasting glucose: Secondary | ICD-10-CM | POA: Diagnosis not present

## 2020-11-01 DIAGNOSIS — I480 Paroxysmal atrial fibrillation: Secondary | ICD-10-CM | POA: Diagnosis not present

## 2020-11-01 DIAGNOSIS — K760 Fatty (change of) liver, not elsewhere classified: Secondary | ICD-10-CM | POA: Diagnosis not present

## 2020-11-01 DIAGNOSIS — D692 Other nonthrombocytopenic purpura: Secondary | ICD-10-CM | POA: Diagnosis not present

## 2020-11-01 DIAGNOSIS — E876 Hypokalemia: Secondary | ICD-10-CM | POA: Diagnosis not present

## 2020-11-01 DIAGNOSIS — Z79899 Other long term (current) drug therapy: Secondary | ICD-10-CM | POA: Diagnosis not present

## 2020-11-01 DIAGNOSIS — Z0000 Encounter for general adult medical examination without abnormal findings: Secondary | ICD-10-CM | POA: Diagnosis not present

## 2020-11-01 DIAGNOSIS — I10 Essential (primary) hypertension: Secondary | ICD-10-CM | POA: Diagnosis not present

## 2020-11-01 DIAGNOSIS — K219 Gastro-esophageal reflux disease without esophagitis: Secondary | ICD-10-CM | POA: Diagnosis not present

## 2020-11-12 ENCOUNTER — Telehealth: Payer: Self-pay | Admitting: Cardiology

## 2020-11-12 NOTE — Telephone Encounter (Signed)
I received a call from Ms. Ezzell who has a history of A.fib currently on Flecainide 50mg  BID. She recently underwent spinal surgery and has been compliant with her Flecainide without A.fib episodes but today  She noticed her HR 130bpm and BP 162/135mmHg. She had previously prescrived Cardizem 30 mg that she took 1 dose of at 10PM. She is calling at 11:30 stating her BP is 125/96 but HR remains at 130bpm. Patient previously had a syncopal episode after Metoprolol dosage and while trying to ambulate to the restroom. In light of that event and her already dosing Cardizem, I have recommended adherance to caution and try to rest and allow medication to work. If she remains in A.fib w/ RVR in AM I would prefer to give a 150mg  Flecainide dose for pill in pocket effect. Patient is currently asymptomatic with A.fib w/ RVR. She endorsed understanding and will reach [place] in AM if she persists in A.fib.

## 2020-12-15 DIAGNOSIS — I10 Essential (primary) hypertension: Secondary | ICD-10-CM | POA: Diagnosis not present

## 2021-01-05 DIAGNOSIS — D225 Melanocytic nevi of trunk: Secondary | ICD-10-CM | POA: Diagnosis not present

## 2021-01-05 DIAGNOSIS — D2262 Melanocytic nevi of left upper limb, including shoulder: Secondary | ICD-10-CM | POA: Diagnosis not present

## 2021-01-05 DIAGNOSIS — D2261 Melanocytic nevi of right upper limb, including shoulder: Secondary | ICD-10-CM | POA: Diagnosis not present

## 2021-01-05 DIAGNOSIS — Z85828 Personal history of other malignant neoplasm of skin: Secondary | ICD-10-CM | POA: Diagnosis not present

## 2021-01-05 DIAGNOSIS — D1801 Hemangioma of skin and subcutaneous tissue: Secondary | ICD-10-CM | POA: Diagnosis not present

## 2021-01-05 DIAGNOSIS — L821 Other seborrheic keratosis: Secondary | ICD-10-CM | POA: Diagnosis not present

## 2021-01-05 DIAGNOSIS — L814 Other melanin hyperpigmentation: Secondary | ICD-10-CM | POA: Diagnosis not present

## 2021-04-27 DIAGNOSIS — I10 Essential (primary) hypertension: Secondary | ICD-10-CM | POA: Diagnosis not present

## 2021-04-27 DIAGNOSIS — M5136 Other intervertebral disc degeneration, lumbar region: Secondary | ICD-10-CM | POA: Diagnosis not present

## 2021-04-27 DIAGNOSIS — M5412 Radiculopathy, cervical region: Secondary | ICD-10-CM | POA: Diagnosis not present

## 2021-04-27 DIAGNOSIS — Z9889 Other specified postprocedural states: Secondary | ICD-10-CM | POA: Diagnosis not present

## 2021-05-26 ENCOUNTER — Other Ambulatory Visit: Payer: Self-pay | Admitting: Physician Assistant

## 2021-06-01 DIAGNOSIS — U071 COVID-19: Secondary | ICD-10-CM | POA: Diagnosis not present

## 2021-06-22 DIAGNOSIS — Z8619 Personal history of other infectious and parasitic diseases: Secondary | ICD-10-CM | POA: Diagnosis not present

## 2021-06-22 DIAGNOSIS — Z853 Personal history of malignant neoplasm of breast: Secondary | ICD-10-CM | POA: Diagnosis not present

## 2021-06-22 DIAGNOSIS — Z8041 Family history of malignant neoplasm of ovary: Secondary | ICD-10-CM | POA: Diagnosis not present

## 2021-06-22 DIAGNOSIS — Z01419 Encounter for gynecological examination (general) (routine) without abnormal findings: Secondary | ICD-10-CM | POA: Diagnosis not present

## 2021-07-03 DIAGNOSIS — Z23 Encounter for immunization: Secondary | ICD-10-CM | POA: Diagnosis not present

## 2021-08-17 DIAGNOSIS — I10 Essential (primary) hypertension: Secondary | ICD-10-CM | POA: Diagnosis not present

## 2021-08-17 DIAGNOSIS — Z6824 Body mass index (BMI) 24.0-24.9, adult: Secondary | ICD-10-CM | POA: Diagnosis not present

## 2021-08-17 DIAGNOSIS — M545 Low back pain, unspecified: Secondary | ICD-10-CM | POA: Diagnosis not present

## 2021-08-18 ENCOUNTER — Other Ambulatory Visit: Payer: Self-pay | Admitting: Cardiovascular Disease

## 2021-09-12 ENCOUNTER — Other Ambulatory Visit: Payer: Self-pay | Admitting: Family Medicine

## 2021-09-12 DIAGNOSIS — Z1231 Encounter for screening mammogram for malignant neoplasm of breast: Secondary | ICD-10-CM

## 2021-10-13 ENCOUNTER — Ambulatory Visit
Admission: RE | Admit: 2021-10-13 | Discharge: 2021-10-13 | Disposition: A | Discharge status: Home / Self Care | Payer: Medicare Other | Source: Ambulatory Visit | Attending: Family Medicine | Admitting: Family Medicine

## 2021-10-13 DIAGNOSIS — Z1231 Encounter for screening mammogram for malignant neoplasm of breast: Secondary | ICD-10-CM

## 2021-11-03 DIAGNOSIS — L609 Nail disorder, unspecified: Secondary | ICD-10-CM | POA: Diagnosis not present

## 2021-11-03 DIAGNOSIS — E2839 Other primary ovarian failure: Secondary | ICD-10-CM | POA: Diagnosis not present

## 2021-11-06 ENCOUNTER — Other Ambulatory Visit: Payer: Self-pay | Admitting: Family Medicine

## 2021-11-06 DIAGNOSIS — E2839 Other primary ovarian failure: Secondary | ICD-10-CM

## 2021-11-11 IMAGING — CR DG CHEST 2V
2 series · 2 of 2 positions shown · non-contrast
Comparison: 03/29/2020

CLINICAL DATA: Weakness, atrial fibrillation

EXAM:
CHEST - 2 VIEW

[chest pa]
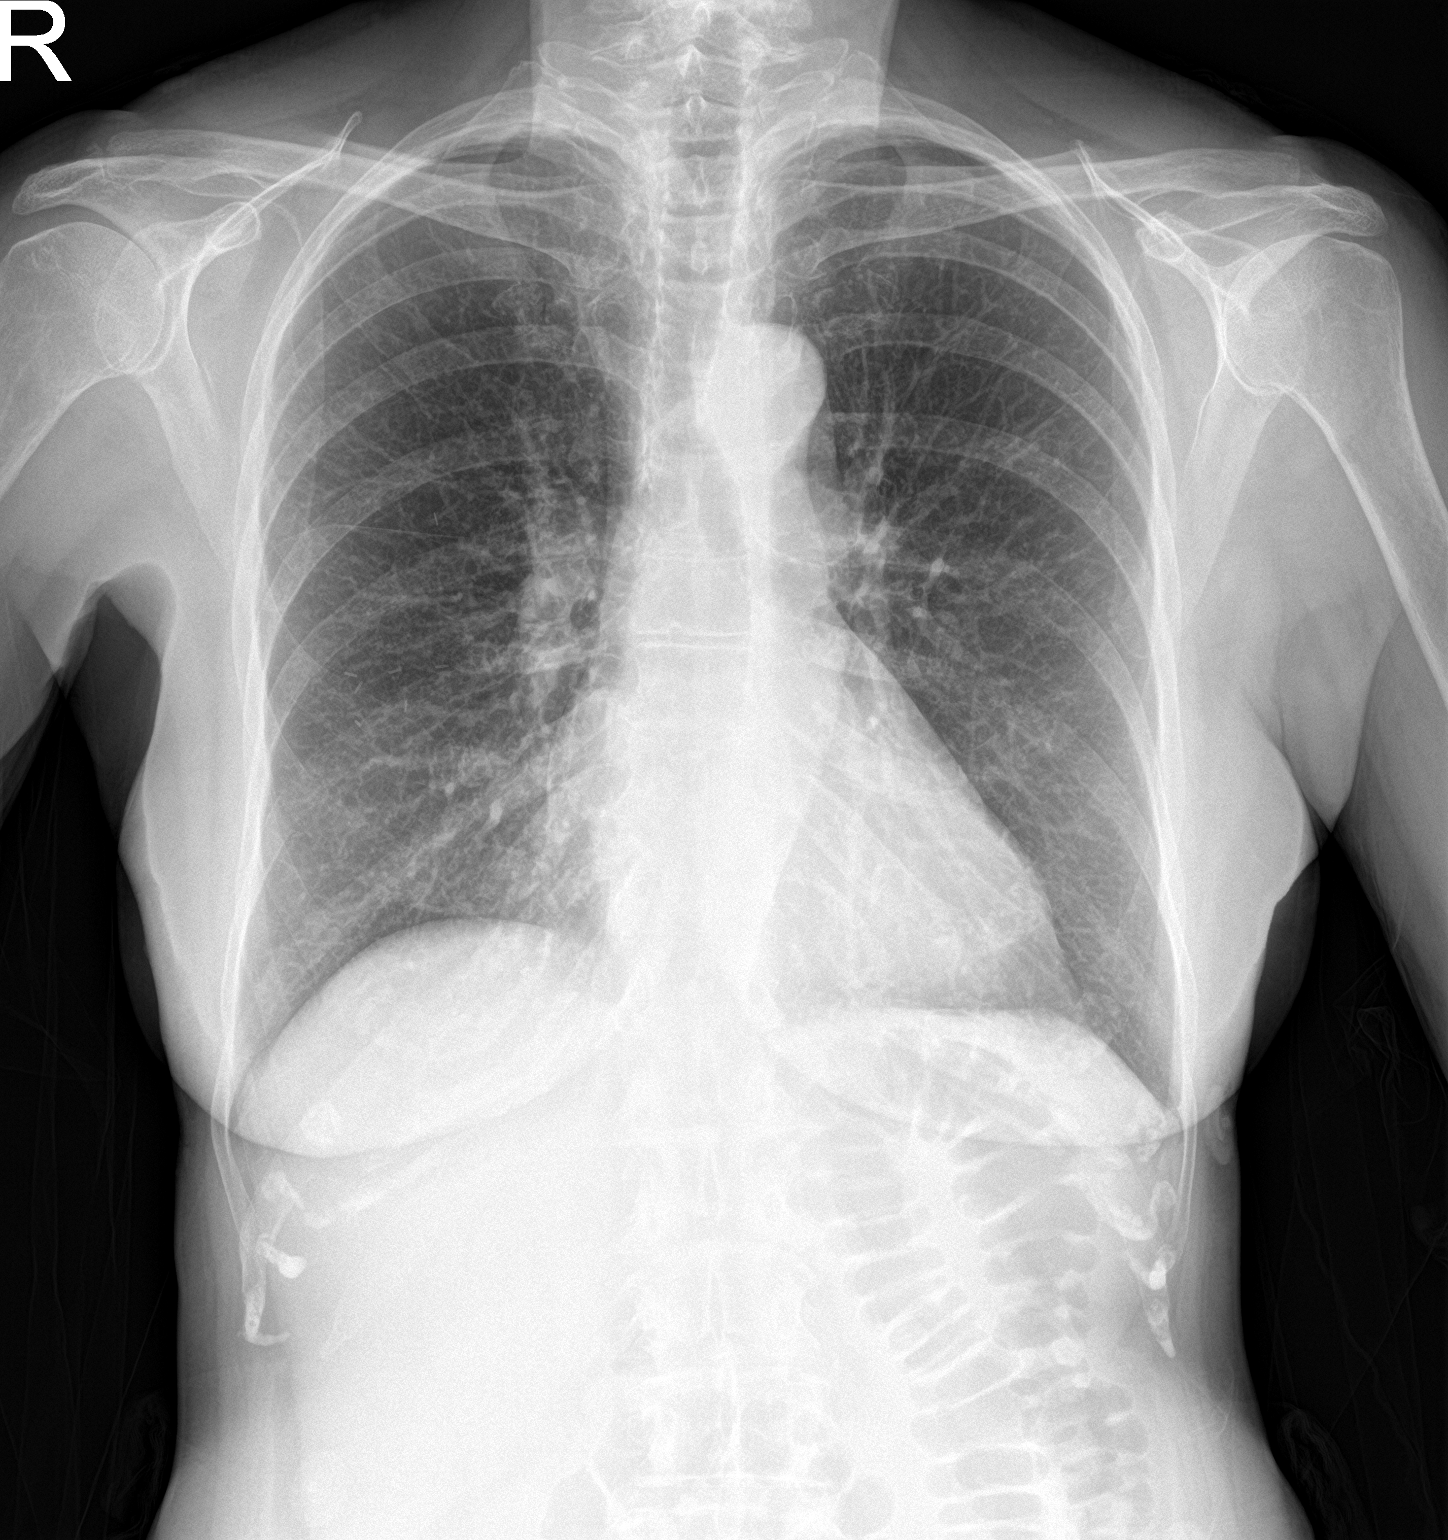

[chest lat]
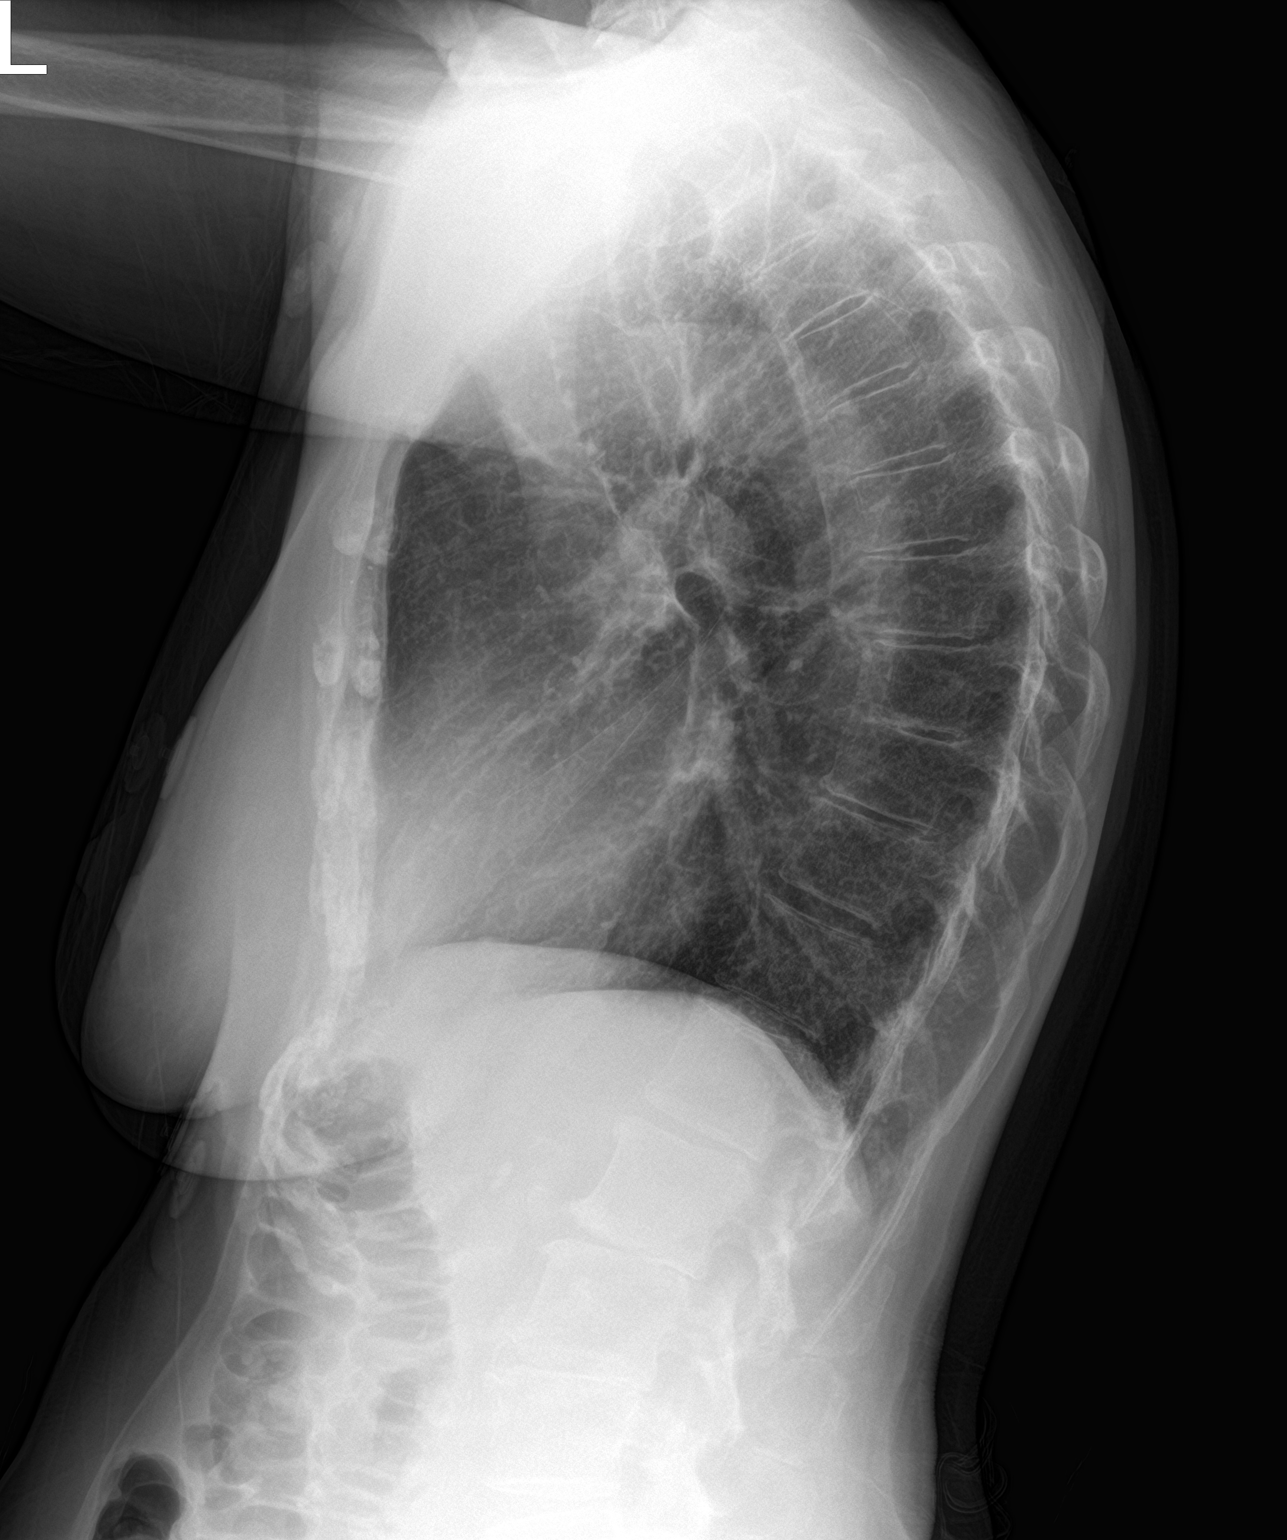

[2 of 2 positions shown; findings below may reference images not displayed]

FINDINGS: The heart size and mediastinal contours are within normal limits.
Both lungs are clear. The visualized skeletal structures are
unremarkable.
IMPRESSION: No active cardiopulmonary disease.

## 2021-11-14 ENCOUNTER — Other Ambulatory Visit: Payer: Self-pay | Admitting: Cardiology

## 2021-11-29 ENCOUNTER — Inpatient Hospital Stay: Payer: Medicare Other | Attending: Genetic Counselor | Admitting: Genetic Counselor

## 2021-11-29 ENCOUNTER — Inpatient Hospital Stay: Payer: Medicare Other

## 2021-11-29 ENCOUNTER — Encounter: Payer: Self-pay | Admitting: Genetic Counselor

## 2021-11-29 ENCOUNTER — Other Ambulatory Visit: Payer: Self-pay

## 2021-11-29 ENCOUNTER — Encounter: Payer: Medicare Other | Admitting: Genetic Counselor

## 2021-11-29 ENCOUNTER — Other Ambulatory Visit: Payer: Medicare Other

## 2021-11-29 DIAGNOSIS — Z803 Family history of malignant neoplasm of breast: Secondary | ICD-10-CM | POA: Diagnosis not present

## 2021-11-29 DIAGNOSIS — C50919 Malignant neoplasm of unspecified site of unspecified female breast: Secondary | ICD-10-CM

## 2021-11-29 DIAGNOSIS — Z8041 Family history of malignant neoplasm of ovary: Secondary | ICD-10-CM

## 2021-11-29 DIAGNOSIS — Z1379 Encounter for other screening for genetic and chromosomal anomalies: Secondary | ICD-10-CM

## 2021-11-29 DIAGNOSIS — Z853 Personal history of malignant neoplasm of breast: Secondary | ICD-10-CM | POA: Diagnosis not present

## 2021-11-29 LAB — GENETIC SCREENING ORDER

## 2021-11-29 NOTE — Progress Notes (Signed)
REFERRING PROVIDER: ?Jonathon Jordan, MD ?[address] ?[address] ?[place],  [place] [address] ? ?PRIMARY PROVIDER:  ?Jonathon Jordan, MD ? ?PRIMARY REASON FOR VISIT:  ?1. Malignant neoplasm of female breast, unspecified estrogen receptor status, unspecified laterality, unspecified site of breast ([place])   ?2. Family history of breast cancer   ?3. Family history of ovarian cancer   ?4. Genetic testing   ? ? ? ?HISTORY OF PRESENT ILLNESS:   ?Ashlee Flores, a 81 y.o. female, was seen for a [place] cancer genetics consultation at the request of Dr. Stephanie Acre due to a personal and family history of cancer.  Ashlee Flores presents to clinic today to discuss the possibility of a hereditary predisposition to cancer, genetic testing, and to further clarify her future cancer risks, as well as potential cancer risks for family members.  ? 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  The treatment plan included a lumpectomy.  The patient has had negative genetic testing in 2013. She is here for updated genetic testing.  ?  ? ?CANCER HISTORY:  ?Oncology History  ? No history exists.  ? ? ? ?RISK FACTORS:  ?Menarche was at age 15.  ?First live birth at age 46.  ?Ovaries intact: no.  ?Hysterectomy: yes.  ?Menopausal status: postmenopausal.  ? ?Past Medical History:  ?Diagnosis Date  ? Arthritis   ? NECK  ? Breast cancer ([place])   ? DCIS breast cancer at 49  ? Diastolic dysfunction 03/17/2422  ? Dysrhythmia   ? PAF  ? Essential hypertension 06/04/2016  ? Family history of breast cancer   ? Family history of ovarian cancer   ? Palpitations 06/04/2016  ? Paroxysmal atrial fibrillation ([place]) 08/22/2016  ? ? ?Past Surgical History:  ?Procedure Laterality Date  ? ANTERIOR CERVICAL DECOMP/DISCECTOMY FUSION N/A 07/22/2020  ? Procedure: Anterior Cervical Discectomy Fusion Cervical Four-Five, Cervical Five- Six, Cervical Six-Seven;  Surgeon: Earnie Larsson, MD;  Location: [place];  Service: Neurosurgery;   Laterality: N/A;  Anterior Cervical Discectomy Fusion Cervical Four-Five, Cervical Five- Six, Cervical Six-Seven  ? BREAST EXCISIONAL BIOPSY Right   ? BREAST SURGERY Right   ? LUMPECTOMY  ? DILATION AND CURETTAGE OF UTERUS    ? EYE SURGERY Left   ? CATARACT  ? SALPINGOOPHORECTOMY    ? preventative, due to FH of ovarian cancer  ? ? ?Social History  ? ?Socioeconomic History  ? Marital status: Married  ?  Spouse name: Not on file  ? Number of children: 4  ? Years of education: Not on file  ? Highest education level: Not on file  ?Occupational History  ? Not on file  ?Tobacco Use  ? Smoking status: Never  ? Smokeless tobacco: Never  ?Vaping Use  ? Vaping Use: Never used  ?Substance and Sexual Activity  ? Alcohol use: Not Currently  ? Drug use: No  ? Sexual activity: Not on file  ?Other Topics Concern  ? Not on file  ?Social History Narrative  ? Not on file  ? ?Social Determinants of Health  ? ?Financial Resource Strain: Not on file  ?Food Insecurity: Not on file  ?Transportation Needs: Not on file  ?Physical Activity: Not on file  ?Stress: Not on file  ?Social Connections: Not on file  ?  ? ?FAMILY HISTORY:  ?We obtained a detailed, 4-generation family history.  Significant diagnoses are listed below: ?Family History  ?Problem Relation Age of Onset  ? Breast cancer  Mother 33  ? Hypertension Mother   ? Heart attack Father   ? Ovarian cancer Sister 5  ?     RAD51C pos  ? Breast cancer Maternal Aunt 45  ? Heart attack Maternal Aunt   ? Heart attack Maternal Uncle   ? Breast cancer Paternal Aunt   ?     dx in her 23s  ? Bone cancer Paternal Uncle   ?     d. 69s  ? Ovarian cancer Maternal Grandmother   ?     died at 61  ? Breast cancer Maternal Grandmother   ? Heart attack Maternal Grandfather   ? Melanoma Daughter 37  ?     on leg  ? Breast cancer Cousin   ?     dx in her 26s  ? Prostate cancer Cousin 79  ? ? ? ? ?The patient has three sons and a daughter.  One son has a son who was diagnosed with ALL and AML at 49.  Her  daughter had a melanoma at 95.  The patient  has one sister who was diagnosed with stage IV ovarian cancer at 39 and survived it. 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  Both parents are deceased. ? ?The patient's mother died of breast cancer at 1.  She had two sisters and a brother.  One sister had breast cancer at 81.  The maternal grandmother had ovarian cancer.  She had a brother with a daughter who also had breast cancer. ? ?The patient's father died at 67.  He had three sisters and two brothers.  One brother had bone cancer and died in his 33's and a sister died in her 3's from breast cancer.  This sister had a daughter who had breast cancer and died in her 16's. ? ?Ashlee Flores is aware of previous family history of genetic testing for hereditary cancer risks. Patient's maternal ancestors are of [place] descent, and paternal ancestors are of [place] descent. There is no reported Ashkenazi Jewish ancestry. There is no known consanguinity. ? ?GENETIC COUNSELING ASSESSMENT: Ms. Kehoe is a 81 y.o. female with a personal and family history of cancer which is somewhat suggestive of a hereditary cancer syndrome and predisposition to cancer given the combination of cancer, ages of onset and known RAD51D mutation. We, therefore, discussed and recommended the following at today's visit.  ? ?DISCUSSION: We discussed that, in general, most cancer is not inherited in families, but instead is sporadic or familial. Sporadic cancers occur by chance and typically happen at older ages (>50 years) as this type of cancer is caused by genetic changes acquired during an individual?s lifetime. Some families have more cancers than would be expected by chance; however, the ages or types of cancer are not consistent with a known genetic mutation or known genetic mutations have been ruled out. This type of familial cancer is thought to be due to a combination of multiple genetic, environmental, hormonal, and lifestyle  factors. While this combination of factors likely increases the risk of cancer, the exact source of this risk is not currently identifiable or testable. ? ?We discussed that 5 - 10% of breast cancer is hereditary, with most cases associated with BRCA mutations.  There are other genes that can be associated with hereditary breast cancer syndromes.  These include ATM, CHEK2, and PALB2.  The patient has undergone genetic testing in 2013 that was negative.  She is here to update her testing.  We discussed that  the RAD51D seems to explain the cancer in her family, but that we can test additional genes through both RNA and DNA.  We discussed that testing is beneficial for several reasons including knowing how to follow individuals after completing their treatment, identifying whether potential treatment options such as PARP inhibitors would be beneficial, and understand if other family members could be at risk for cancer and allow them to undergo genetic testing.  ? ?We reviewed the characteristics, features and inheritance patterns of hereditary cancer syndromes. We also discussed genetic testing, including the appropriate family members to test, the process of testing, insurance coverage and turn-around-time for results. We discussed the implications of a negative, positive, carrier and/or variant of uncertain significant result. We recommended Ms. Elster pursue genetic testing for the Multi-cancer gene panel.  ? ?The Multi-Gene Panel offered by [facility] includes sequencing and/or deletion duplication testing of the following 84 genes: AIP, ALK, APC, ATM, AXIN2,BAP1,  BARD1, BLM, BMPR1A, BRCA1, BRCA2, BRIP1, CASR, CDC73, CDH1, CDK4, CDKN1B, CDKN1C, CDKN2A (p14ARF), CDKN2A (p16INK4a), CEBPA, CHEK2, CTNNA1, DICER1, DIS3L2, EGFR (c.2369C>T, p.Thr790Met variant only), EPCAM (Deletion/duplication testing only), FH, FLCN, GATA2, GPC3, GREM1 (Promoter region deletion/duplication testing only), HOXB13 (c.251G>A, p.Gly84Glu),  HRAS, KIT, MAX, MEN1, MET, MITF (c.952G>A, p.Glu318Lys variant only), MLH1, MSH2, MSH3, MSH6, MUTYH, NBN, NF1, NF2, NTHL1, PALB2, PDGFRA, PHOX2B, PMS2, POLD1, POLE, POT1, PRKAR1A, PTCH1, PTEN, RAD50, RAD51C, RAD5

## 2021-12-05 DIAGNOSIS — Z0000 Encounter for general adult medical examination without abnormal findings: Secondary | ICD-10-CM | POA: Diagnosis not present

## 2021-12-05 DIAGNOSIS — R7301 Impaired fasting glucose: Secondary | ICD-10-CM | POA: Diagnosis not present

## 2021-12-05 DIAGNOSIS — I10 Essential (primary) hypertension: Secondary | ICD-10-CM | POA: Diagnosis not present

## 2021-12-05 DIAGNOSIS — M5136 Other intervertebral disc degeneration, lumbar region: Secondary | ICD-10-CM | POA: Diagnosis not present

## 2021-12-05 DIAGNOSIS — D352 Benign neoplasm of pituitary gland: Secondary | ICD-10-CM | POA: Diagnosis not present

## 2021-12-05 DIAGNOSIS — I272 Pulmonary hypertension, unspecified: Secondary | ICD-10-CM | POA: Diagnosis not present

## 2021-12-05 DIAGNOSIS — Z79899 Other long term (current) drug therapy: Secondary | ICD-10-CM | POA: Diagnosis not present

## 2021-12-05 DIAGNOSIS — E559 Vitamin D deficiency, unspecified: Secondary | ICD-10-CM | POA: Diagnosis not present

## 2021-12-05 DIAGNOSIS — I480 Paroxysmal atrial fibrillation: Secondary | ICD-10-CM | POA: Diagnosis not present

## 2021-12-05 DIAGNOSIS — I519 Heart disease, unspecified: Secondary | ICD-10-CM | POA: Diagnosis not present

## 2021-12-05 DIAGNOSIS — F3341 Major depressive disorder, recurrent, in partial remission: Secondary | ICD-10-CM | POA: Diagnosis not present

## 2021-12-05 DIAGNOSIS — D692 Other nonthrombocytopenic purpura: Secondary | ICD-10-CM | POA: Diagnosis not present

## 2021-12-06 ENCOUNTER — Other Ambulatory Visit: Payer: Self-pay

## 2021-12-06 MED ORDER — POTASSIUM CHLORIDE ER 20 MEQ PO TBCR: 1.0000 | EXTENDED_RELEASE_TABLET | Freq: Two times a day (BID) | ORAL | 0 refills | Status: DC

## 2021-12-26 ENCOUNTER — Telehealth: Payer: Self-pay | Admitting: Genetic Counselor

## 2021-12-26 ENCOUNTER — Ambulatory Visit: Payer: Self-pay | Admitting: Genetic Counselor

## 2021-12-26 DIAGNOSIS — Z20822 Contact with and (suspected) exposure to covid-19: Secondary | ICD-10-CM | POA: Diagnosis not present

## 2021-12-26 DIAGNOSIS — Z1379 Encounter for other screening for genetic and chromosomal anomalies: Secondary | ICD-10-CM

## 2021-12-26 DIAGNOSIS — C50919 Malignant neoplasm of unspecified site of unspecified female breast: Secondary | ICD-10-CM

## 2021-12-26 NOTE — Progress Notes (Signed)
HPI:  Ashlee Flores was previously seen in the [place] clinic due to a personal and family history of breast cancer and concerns regarding a hereditary predisposition to cancer. Please refer to our prior cancer genetics clinic note for more information regarding our discussion, assessment and recommendations, at the time. Ashlee Flores recent genetic test results were disclosed to her, as were recommendations warranted by these results. These results and recommendations are discussed in more detail below. ? ?CANCER HISTORY:  ?Oncology History  ?Breast cancer ([place])  ? Initial Diagnosis  ? Breast cancer [facility]) ?  ?12/25/2021 Genetic Testing  ? Negative genetic testing on the Multicancer +RNA Panel.  CHEK2, EGFR and PALB2 VUS were identified. The report date is December 25, 2021. ? ?The Multi-Gene Panel offered by [facility] includes sequencing and/or deletion duplication testing of the following 84 genes: AIP, ALK, APC, ATM, AXIN2,BAP1,  BARD1, BLM, BMPR1A, BRCA1, BRCA2, BRIP1, CASR, CDC73, CDH1, CDK4, CDKN1B, CDKN1C, CDKN2A (p14ARF), CDKN2A (p16INK4a), CEBPA, CHEK2, CTNNA1, DICER1, DIS3L2, EGFR (c.2369C>T, p.Thr790Met variant only), EPCAM (Deletion/duplication testing only), FH, FLCN, GATA2, GPC3, GREM1 (Promoter region deletion/duplication testing only), HOXB13 (c.251G>A, p.Gly84Glu), HRAS, KIT, MAX, MEN1, MET, MITF (c.952G>A, p.Glu318Lys variant only), MLH1, MSH2, MSH3, MSH6, MUTYH, NBN, NF1, NF2, NTHL1, PALB2, PDGFRA, PHOX2B, PMS2, POLD1, POLE, POT1, PRKAR1A, PTCH1, PTEN, RAD50, RAD51C, RAD51D, RB1, RECQL4, RET, RUNX1, SDHAF2, SDHA (sequence changes only), SDHB, SDHC, SDHD, SMAD4, SMARCA4, SMARCB1, SMARCE1, STK11, SUFU, TERC, TERT, TMEM127, TP53, TSC1, TSC2, VHL, WRN and WT1.    ? ?BART testing through [facility] was Negative.  CHEK2 and PALB2 VUS found on OvaNext Panel test.  The OvaNext gene panel offered by [facility] and includes sequencing and rearrangement analysis for the following 21  genes: ATM, BARD1, BRCA1, BRCA2, BRIP1, CDH1, CHEK2, EPCAM, MLH1, MRE11A, MSH2, MSH6, MUTYH, NBN, PALB2, PMS2, PTEN, RAD50, RAD51C, STK11, and TP53. The report date is: June 16, 2012. ? ?RAD51D targeted testing through [facility] is negative.  The report date is 01/21/2015. ?  ? ? ?FAMILY HISTORY:  ?We obtained a detailed, 4-generation family history.  Significant diagnoses are listed below: ?Family History  ?Problem Relation Age of Onset  ? Breast cancer Mother 8  ? Hypertension Mother   ? Heart attack Father   ? Ovarian cancer Sister 30  ?     RAD51C pos  ? Breast cancer Maternal Aunt 13  ? Heart attack Maternal Aunt   ? Heart attack Maternal Uncle   ? Breast cancer Paternal Aunt   ?     dx in her 50s  ? Bone cancer Paternal Uncle   ?     d. 67s  ? Ovarian cancer Maternal Grandmother   ?     died at 18  ? Breast cancer Maternal Grandmother   ? Heart attack Maternal Grandfather   ? Melanoma Daughter 75  ?     on leg  ? Breast cancer Cousin   ?     dx in her 80s  ? Prostate cancer Cousin 51  ? ? ?  ?The patient has three sons and a daughter.  One son has a son who was diagnosed with ALL and AML at 53.  Her daughter had a melanoma at 73.  The patient  has one sister who was diagnosed with stage IV ovarian cancer at 23 and survived it. 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  Both parents are deceased. ?  ?The patient's mother died of breast cancer at 32.  She had two sisters and a brother.  One sister had breast cancer at 3.  The maternal grandmother had ovarian cancer.  She had a brother with a daughter who also had breast cancer. ?  ?The patient's father died at 62.  He had three sisters and two brothers.  One brother had bone cancer and died in his 24's and a sister died in her 77's from breast cancer.  This sister had a daughter who had breast cancer and died in her 76's. ?  ?Ashlee Flores is aware of previous family history of genetic testing for hereditary cancer risks. Patient's maternal ancestors  are of [place] descent, and paternal ancestors are of [place] descent. There is no reported Ashkenazi Jewish ancestry. There is no known consanguinity ? ?GENETIC TEST RESULTS: Genetic testing reported out on December 25, 2021 through the CancerNext-Expanded+RNAinsight cancer panel found no pathogenic mutations. The CancerNext-Expanded gene panel offered by [facility] and includes sequencing and rearrangement analysis for the following 77 genes: AIP, ALK, APC*, ATM*, AXIN2, BAP1, BARD1, BLM, BMPR1A, BRCA1*, BRCA2*, BRIP1*, CDC73, CDH1*, CDK4, CDKN1B, CDKN2A, CHEK2*, CTNNA1, DICER1, FANCC, FH, FLCN, GALNT12, KIF1B, LZTR1, MAX, MEN1, MET, MLH1*, MSH2*, MSH3, MSH6*, MUTYH*, NBN, NF1*, NF2, NTHL1, PALB2*, PHOX2B, PMS2*, POT1, PRKAR1A, PTCH1, PTEN*, RAD51C*, RAD51D*, RB1, RECQL, RET, SDHA, SDHAF2, SDHB, SDHC, SDHD, SMAD4, SMARCA4, SMARCB1, SMARCE1, STK11, SUFU, TMEM127, TP53*, TSC1, TSC2, VHL and XRCC2 (sequencing and deletion/duplication); EGFR, EGLN1, HOXB13, KIT, MITF, PDGFRA, POLD1, and POLE (sequencing only); EPCAM and GREM1 (deletion/duplication only). DNA and RNA analyses performed for * genes. The test report has been scanned into EPIC and is located under the Molecular Pathology section of the Results Review tab.  A portion of the result report is included below for reference.  ? ? ? ?We discussed with Ashlee Flores that because current genetic testing is not perfect, it is possible there may be a gene mutation in one of these genes that current testing cannot detect, but that chance is small.  We also discussed, that there could be another gene that has not yet been discovered, or that we have not yet tested, that is responsible for the cancer diagnoses in the family. It is also possible there is a hereditary cause for the cancer in the family that Ashlee Flores did not inherit and therefore was not identified in her testing.  Therefore, it is important to remain in touch with cancer genetics in the future so that  we can continue to offer Ashlee Flores the most up to date genetic testing.  ? ?Genetic testing did identify three Variants of uncertain significance (VUS) - one in the CHEK2 gene called c.400G>C, a second in the EGFR gene called c.1668A>G, and a third in the PALB2 gene called c.3296C>G.  At this time, it is unknown if these variants are associated with increased cancer risk or if they are normal findings, but most variants such as these get reclassified to being inconsequential. They should not be used to make medical management decisions. With time, we suspect the lab will determine the significance of these variants, if any. If we do learn more about them, we will try to contact Ashlee Flores to discuss it further. However, it is important to stay in touch with [place] periodically and keep the address and phone number up to date. ? ?ADDITIONAL GENETIC TESTING: We discussed with Ashlee Flores that her genetic testing was fairly extensive.  If there are genes identified to increase cancer risk that can be analyzed in the  future, we would be happy to discuss and coordinate this testing at that time.   ? ?CANCER SCREENING RECOMMENDATIONS: Ashlee Flores test result is considered negative (normal).  This means that we have not identified a hereditary cause for her personal and family history of breast cancer at this time. Most cancers happen by chance and this negative test suggests that her cancer may fall into this category.  Ashlee Flores sister was found to have a RAD51C pathogenic mutation.  This mutation does explain the ovarian cancer found in this sister. ? ?While reassuring, this does not definitively rule out a hereditary predisposition to cancer. It is still possible that there could be genetic mutations that are undetectable by current technology. There could be genetic mutations in genes that have not been tested or identified to increase cancer risk.  Therefore, it is recommended she continue to follow the cancer  management and screening guidelines provided by her oncology and primary healthcare provider.  ? ?An individual's cancer risk and medical management are not determined by genetic test results alone. Over

## 2021-12-26 NOTE — Telephone Encounter (Signed)
LM on VM that results are back and to please call. 

## 2021-12-26 NOTE — Telephone Encounter (Signed)
Revealed negative genetic testing.  Discussed that we do not know why she has breast cancer or why there is cancer in the family. It could be due to a different gene that we are not testing, or maybe our current technology may not be able to pick something up.  It will be important for her to keep in contact with genetics to keep up with whether additional testing may be needed. ? ?Three VUSs were identified - one each in CHEK2, EGFR and PALB2.  These will not change medical management. ? ? ?

## 2021-12-28 ENCOUNTER — Other Ambulatory Visit: Payer: Self-pay | Admitting: Cardiology

## 2021-12-28 MED ORDER — POTASSIUM CHLORIDE ER 20 MEQ PO TBCR: 1.0000 | EXTENDED_RELEASE_TABLET | Freq: Two times a day (BID) | ORAL | 1 refills | Status: DC

## 2022-01-03 DIAGNOSIS — D2272 Melanocytic nevi of left lower limb, including hip: Secondary | ICD-10-CM | POA: Diagnosis not present

## 2022-01-03 DIAGNOSIS — D2271 Melanocytic nevi of right lower limb, including hip: Secondary | ICD-10-CM | POA: Diagnosis not present

## 2022-01-03 DIAGNOSIS — L821 Other seborrheic keratosis: Secondary | ICD-10-CM | POA: Diagnosis not present

## 2022-01-03 DIAGNOSIS — L601 Onycholysis: Secondary | ICD-10-CM | POA: Diagnosis not present

## 2022-01-03 DIAGNOSIS — Z85828 Personal history of other malignant neoplasm of skin: Secondary | ICD-10-CM | POA: Diagnosis not present

## 2022-01-03 DIAGNOSIS — D225 Melanocytic nevi of trunk: Secondary | ICD-10-CM | POA: Diagnosis not present

## 2022-01-17 ENCOUNTER — Telehealth: Payer: Self-pay | Admitting: Cardiovascular Disease

## 2022-01-17 DIAGNOSIS — I480 Paroxysmal atrial fibrillation: Secondary | ICD-10-CM

## 2022-01-17 NOTE — Telephone Encounter (Signed)
RN returned call to patient. Patient states that she has been treated for A. Fib by Dr. Oval Linsey in the past, as well as Dr. Quentin Ore. Patient states she has not seen Dr. Oval Linsey in a long time and has an appointment with Dr. Quentin Ore at the end of the month. ? ?Heart rate was in the 140s and watch indicated A. Fib, patient got up and went to the restroom and took rescue dose of Dilt at 3am. Woke up at 6:30 still with high rate of 135 and showing in A. Fib.  ? ?Patient states she has an upcoming in June out of the country. Patient is requesting to be seen sooner than her [place] appointment. Patient is requesting to wear a monitor before trip.  ? ?RN informed her that any medical decisions will be up to Dr. Quentin Ore. Patient does endorse that she spoke with someone about an earlier appointment yesterday and was told that she is on the wait list.  ? ?Patient endorses recent anxiety about upcoming travel and worrying about what to do if she has A. Fib on her trip. Patient also endorses that she ate way out of her normal yesterday and her entire system felt off.  ? ?Routing to Dr. Quentin Ore and Saddie Benders for further advisement.  ?

## 2022-01-17 NOTE — Telephone Encounter (Signed)
Patient c/o Palpitations:  High priority if patient c/o lightheadedness, shortness of breath, or chest pain ? ?How long have you had palpitations/irregular HR/ Afib? A-fib last night at midnight to 7:30am Are you having the symptoms now? no ? ?Are you currently experiencing lightheadedness, SOB or CP? no ? ?Do you have a history of afib (atrial fibrillation) or irregular heart rhythm? yes ? ?Have you checked your BP or HR? (document readings if available): 125/90  ? ?Are you experiencing any other symptoms? Her HR was 133, 135, right now it's 114.  She states she did take diltiazem at 3am this morning. No other symptoms besides the rapid HR.    ?

## 2022-01-19 ENCOUNTER — Ambulatory Visit (INDEPENDENT_AMBULATORY_CARE_PROVIDER_SITE_OTHER): Payer: Medicare Other

## 2022-01-19 DIAGNOSIS — I480 Paroxysmal atrial fibrillation: Secondary | ICD-10-CM

## 2022-01-19 NOTE — Progress Notes (Unsigned)
Enrolled patient for a 14 day Zio XT  monitor to be mailed to patients home  °

## 2022-01-19 NOTE — Telephone Encounter (Signed)
Discussed plan with the patient. Verbalized understanding and agreement.  ?

## 2022-01-19 NOTE — Telephone Encounter (Signed)
Spoke with patient clarifying appt showing on MyChart today. Advised that it is a heart monitor that is being mailed out. While on the phone pt was disconnected. Called patient back and she said she was just spoke with someone who also explained about the heart monitor.  ? ?

## 2022-01-25 ENCOUNTER — Other Ambulatory Visit: Payer: Self-pay | Admitting: Cardiology

## 2022-02-06 ENCOUNTER — Ambulatory Visit: Payer: Medicare Other | Admitting: Cardiology

## 2022-02-13 DIAGNOSIS — I480 Paroxysmal atrial fibrillation: Secondary | ICD-10-CM | POA: Diagnosis not present

## 2022-02-14 ENCOUNTER — Ambulatory Visit (INDEPENDENT_AMBULATORY_CARE_PROVIDER_SITE_OTHER): Payer: Medicare Other | Admitting: Cardiology

## 2022-02-14 ENCOUNTER — Encounter: Payer: Self-pay | Admitting: Cardiology

## 2022-02-14 VITALS — BP 104/60 | HR 61 | Ht 64.0 in | Wt 131.0 lb

## 2022-02-14 DIAGNOSIS — Z79899 Other long term (current) drug therapy: Secondary | ICD-10-CM | POA: Diagnosis not present

## 2022-02-14 DIAGNOSIS — I10 Essential (primary) hypertension: Secondary | ICD-10-CM | POA: Diagnosis not present

## 2022-02-14 DIAGNOSIS — I480 Paroxysmal atrial fibrillation: Secondary | ICD-10-CM

## 2022-02-14 MED ORDER — PANTOPRAZOLE SODIUM 40 MG PO TBEC: 40.0000 mg | DELAYED_RELEASE_TABLET | Freq: Every day | ORAL | 3 refills | Status: DC

## 2022-02-14 MED ORDER — POTASSIUM CHLORIDE ER 20 MEQ PO TBCR: 20.0000 meq | EXTENDED_RELEASE_TABLET | Freq: Two times a day (BID) | ORAL | 3 refills | Status: DC

## 2022-02-14 NOTE — Patient Instructions (Addendum)
Medication Instructions:  Start Protonix 40 mg once daily Your physician recommends that you continue on your current medications as directed. Please refer to the Current Medication list given to you today. *If you need a refill on your cardiac medications before your next appointment, please call your pharmacy*  Lab Work: None. If you have labs (blood work) drawn today and your tests are completely normal, you will receive your results only by: [place] (if you have MyChart) OR A paper copy in the mail If you have any lab test that is abnormal or we need to change your treatment, we will call you to review the results.  Testing/Procedures: None.  Follow-Up: At [facility], you and your health needs are our priority.  As part of our continuing mission to provide you with exceptional heart care, we have created designated Provider Care Teams.  These Care Teams include your primary Cardiologist (physician) and Advanced Practice Providers (APPs -  Physician Assistants and Nurse Practitioners) who all work together to provide you with the care you need, when you need it.  Your physician wants you to follow-up in: 6 months with one of the following Advanced Practice Providers on your designated Care Team:    Tommye Standard, [place] Legrand Como "Jonni Sanger" [place], [place]   We recommend signing up for the patient portal called "MyChart".  Sign up information is provided on this After Visit Summary.  MyChart is used to connect with patients for Virtual Visits (Telemedicine).  Patients are able to view lab/test results, encounter notes, upcoming appointments, etc.  Non-urgent messages can be sent to your provider as well.   To learn more about what you can do with MyChart, go to [email or web address].    Any Other Special Instructions Will Be Listed Below (If Applicable).

## 2022-02-14 NOTE — Progress Notes (Signed)
Electrophysiology Office Follow up Visit Note:    Date:  02/14/2022   ID:  Ashlee Flores, DOB [birth year], MRN [id number]  PCP:  Jonathon Jordan, MD  [facility] Cardiologist:  Skeet Latch, MD  [facility] Electrophysiologist:  Vickie Epley, MD    Interval History:    Ashlee Flores is a 81 y.o. female who presents for a follow up visit. They were last seen in clinic September 22, 2020 for symptomatic paroxysmal atrial fibrillation on flecainide and diltiazem.  At the time of my last appointment she reported good control of her atrial fibrillation, maintaining normal rhythm.  In early May the patient reached out and reported episodes of symptomatic atrial fibrillation with rapid ventricular rates up to the 140s.  A heart monitor was ordered which showed atrial fibrillation with ventricular rates between 90 and 166 bpm that corresponded to symptoms.  Today she is doing very well.  She is very pleased with the results of the flecainide and Cardizem.     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28   Diastolic dysfunction 11/12/3612   Dysrhythmia    PAF   Essential hypertension 06/04/2016   Family history of breast cancer    Family history of ovarian cancer    Palpitations 06/04/2016   Paroxysmal atrial fibrillation ([place]) 08/22/2016    Past Surgical History:  Procedure Laterality Date   ANTERIOR CERVICAL DECOMP/DISCECTOMY FUSION N/A 07/22/2020   Procedure: Anterior Cervical Discectomy Fusion Cervical Four-Five, Cervical Five- Six, Cervical Six-Seven;  Surgeon: Earnie Larsson, MD;  Location: [place];  Service: Neurosurgery;  Laterality: N/A;  Anterior Cervical Discectomy Fusion Cervical Four-Five, Cervical Five- Six, Cervical Six-Seven   BREAST EXCISIONAL BIOPSY Right    BREAST SURGERY Right    LUMPECTOMY   DILATION AND CURETTAGE OF UTERUS     EYE SURGERY Left    CATARACT   SALPINGOOPHORECTOMY      preventative, due to FH of ovarian cancer    Current Medications: Current Meds  Medication Sig   acetaminophen (TYLENOL) 500 MG tablet Take 500-1,000 mg by mouth every 6 (six) hours as needed for mild pain.    apixaban (ELIQUIS) 5 MG TABS tablet TAKE 1 TABLET (5 MG TOTAL) BY MOUTH 2 (TWO) TIMES DAILY.   Calcium Carb-Cholecalciferol (CALCIUM 600/VITAMIN D3 PO) Take 1 tablet by mouth daily.   diltiazem (CARDIZEM) 30 MG tablet CARDIZEM '30MG'$  -- TAKE 1/2 TO 1 TABLET EVERY 4 HOURS AS NEEDED FOR AFIB HEART RATE >100 AS LONG AS TOP BLOOD PRESSURE >100. (Patient taking differently: Cardizem '30mg'$  -- take 1/2 to 1 tablet every 4 hours AS NEEDED for afib  heart rate >100 as long as top blood pressure >100.)   flecainide (TAMBOCOR) 50 MG tablet TAKE 1 TABLET BY MOUTH EVERY 12 HOURS   hydrochlorothiazide (HYDRODIURIL) 25 MG tablet Take 1 tablet (25 mg total) by mouth daily.   lisinopril (PRINIVIL,ZESTRIL) 10 MG tablet Take 10 mg by mouth daily.    pantoprazole (PROTONIX) 40 MG tablet Take 1 tablet (40 mg total) by mouth daily.   [DISCONTINUED] Potassium Chloride ER 20 MEQ TBCR Take 20 mEq by mouth in the morning and at bedtime.     Allergies:   Penicillins   Social History   Socioeconomic History   Marital status: Married    Spouse name: Not on file   Number of children: 4   Years of education: Not  on file   Highest education level: Not on file  Occupational History   Not on file  Tobacco Use   Smoking status: Never   Smokeless tobacco: Never  Vaping Use   Vaping Use: Never used  Substance and Sexual Activity   Alcohol use: Not Currently   Drug use: No   Sexual activity: Not on file  Other Topics Concern   Not on file  Social History Narrative   Not on file   Social Determinants of Health   Financial Resource Strain: Not on file  Food Insecurity: Not on file  Transportation Needs: Not on file  Physical Activity: Not on file  Stress: Not on file  Social Connections: Not on file      Family History: The patient's family history includes Bone cancer in her paternal uncle; Breast cancer in her cousin, maternal grandmother, and paternal aunt; Breast cancer (age of onset: 12) in her maternal aunt; Breast cancer (age of onset: 47) in her mother; Heart attack in her father, maternal aunt, maternal grandfather, and maternal uncle; Hypertension in her mother; Melanoma (age of onset: 8) in her daughter; Ovarian cancer in her maternal grandmother; Ovarian cancer (age of onset: 76) in her sister; Prostate cancer (age of onset: 52) in her cousin.  ROS:   Please see the history of present illness.    All other systems reviewed and are negative.  EKGs/Labs/Other Studies Reviewed:    The following studies were reviewed today:  Jan 19, 2021 ZIO monitor personally reviewed Heart rate 39-1 67, average 56 bpm Less than 1% A-fib burden with ventricular rates ranging from 90 to 166 bpm and correlating to symptom triggered events Rare ventricular ectopy  May 04, 2020 SPECT Low risk study  EKG:  The ekg ordered today demonstrates sinus rhythm.  PR interval 146 ms.  QRS duration 110 ms.  Recent Labs: No results found for requested labs within last 8760 hours.  Recent Lipid Panel    Component Value Date/Time   CHOL 189 08/31/2019 1030   TRIG 73 08/31/2019 1030   HDL 84 08/31/2019 1030   CHOLHDL 2.3 08/31/2019 1030   LDLCALC 92 08/31/2019 1030    Physical Exam:    VS:  BP 104/60   Pulse 61   Ht '5\' 4"'$  (1.626 m)   Wt 131 lb (59.4 kg)   SpO2 96%   BMI 22.49 kg/m     Wt Readings from Last 3 Encounters:  02/14/22 131 lb (59.4 kg)  09/22/20 126 lb 9.6 oz (57.4 kg)  08/01/20 125 lb (56.7 kg)     GEN:  Well nourished, well developed in no acute distress HEENT: Normal NECK: No JVD; No carotid bruits LYMPHATICS: No lymphadenopathy CARDIAC: RRR, no murmurs, rubs, gallops RESPIRATORY:  Clear to auscultation without rales, wheezing or rhonchi  ABDOMEN: Soft, non-tender,  non-distended MUSCULOSKELETAL:  No edema; No deformity  SKIN: Warm and dry NEUROLOGIC:  Alert and oriented x 3 PSYCHIATRIC:  Normal affect        ASSESSMENT:    1. PAF (paroxysmal atrial fibrillation) ([place])   2. Essential hypertension   3. Encounter for long-term (current) use of high-risk medication    PLAN:    In order of problems listed above:  #Paroxysmal atrial fibrillation Very low burden of atrial fibrillation.  Continue flecainide 50 mg by mouth twice daily and Cardizem. I will add a very low-dose Toprol-XL at night to mitigate the risk of flecainide associated flutters. She will continue Eliquis for stroke  prophylaxis.  #Hypertension Controlled Continue current medication regimen  Follow-up with an APP in 6 months or sooner as needed.   Medication Adjustments/Labs and Tests Ordered: Current medicines are reviewed at length with the patient today.  Concerns regarding medicines are outlined above.  Orders Placed This Encounter  Procedures   EKG 12-Lead   Meds ordered this encounter  Medications   Potassium Chloride ER 20 MEQ TBCR    Sig: Take 20 mEq by mouth in the morning and at bedtime.    Dispense:  180 tablet    Refill:  3   pantoprazole (PROTONIX) 40 MG tablet    Sig: Take 1 tablet (40 mg total) by mouth daily.    Dispense:  90 tablet    Refill:  3     Signed, Lars Mage, MD, [facility], [facility] 02/14/2022 9:26 PM    Electrophysiology [facility]

## 2022-02-15 ENCOUNTER — Telehealth: Payer: Self-pay | Admitting: *Deleted

## 2022-02-15 MED ORDER — METOPROLOL SUCCINATE ER 25 MG PO TB24: 25.0000 mg | ORAL_TABLET | Freq: Every day | ORAL | 3 refills | Status: DC

## 2022-02-15 NOTE — Telephone Encounter (Signed)
-----   Message from Vickie Epley, MD sent at 02/14/2022  9:24 PM EDT ----- Tawni Pummel, can you add Toprol-XL 12.5 mg by mouth once daily at night for this patient?  She will continue to have diltiazem available for as needed dosing.  The metoprolol is to help reduce the chances of having a flecainide associated atrial flutter.  Can you let her know? Thanks, Lysbeth Galas

## 2022-02-15 NOTE — Telephone Encounter (Signed)
Spoke to patient and notified about medication change.  Patient verbalized understanding and agreement.

## 2022-02-16 NOTE — Telephone Encounter (Signed)
Pt c/o medication issue:  1. Name of Medication: Toprol-XL 12.5 mg   2. How are you currently taking this medication (dosage and times per day)?   3. Are you having a reaction (difficulty breathing--STAT)?   4. What is your medication issue? Pt called back and states that she has questions in regards to this medication again. She states she was told this is for heart flutters, but this is what she has questions about, because she didn't realize she was having heart flutters. She says she needs more info on this before she starts taking it. Please advise.

## 2022-02-16 NOTE — Telephone Encounter (Signed)
I spoke with patient and explained to her Toprol works along with current medications to help keep her heart rate under control.

## 2022-02-19 MED ORDER — METOPROLOL SUCCINATE ER 25 MG PO TB24: 12.5000 mg | ORAL_TABLET | Freq: Every day | ORAL | 3 refills | Status: DC

## 2022-02-19 NOTE — Telephone Encounter (Signed)
Pt is calling in requesting call back in regards to this medication. She also added a message this morning requested call back.

## 2022-02-19 NOTE — Telephone Encounter (Signed)
Patient wanted to note that she has not started the Metoprolol Succinate yet because she feels like this medication is unsafe for her to take.  She states in July of 2021 she was on a "rescue metoprolol, that she took and it caused her to pass out and fall requiring surgery."  Of note it was metoprolol tartrate 12.5 mg BID PRN) on the AVS form it said not to take until follow up with cardiology. Then at [place] patient reported not taking.   Will forward to MD for advisement.  Patient verbalized understanding.

## 2022-02-19 NOTE — Addendum Note (Signed)
Addended by: Darrell Jewel on: 02/19/2022 10:53 AM   Modules accepted: Orders

## 2022-02-21 NOTE — Telephone Encounter (Signed)
Pt states she was never called back in regards to this medication and would like a call back.

## 2022-02-22 ENCOUNTER — Ambulatory Visit: Payer: Medicare Other | Admitting: Cardiology

## 2022-02-23 NOTE — Telephone Encounter (Signed)
Patient verbalized understanding and agreement. She does not feel like to needs an appt with APP at this time.

## 2022-03-27 ENCOUNTER — Telehealth: Payer: Self-pay | Admitting: Cardiology

## 2022-03-27 NOTE — Telephone Encounter (Signed)
Called patient and advised that this was discussed with RN on 02/15/22. Confirmed patient has still not started the beta blocker and continues to take flecainide.  Made appt with APP Oda Kilts on 7/21 8:20 am for further education.  Patient verbalized agreement with read back.

## 2022-03-27 NOTE — Telephone Encounter (Signed)
  Per MyChart scheduling message:  I saw Dr. Veva Holes and I thought all was well and that night he wrote a prescription to the pharmacy. It is a med I took as a rescue med and I had a bad reaction ( passed out and fell) and I don't understand why I would take it every day. So I need to understand this.

## 2022-03-30 ENCOUNTER — Ambulatory Visit (INDEPENDENT_AMBULATORY_CARE_PROVIDER_SITE_OTHER): Payer: Medicare Other | Admitting: Student

## 2022-03-30 ENCOUNTER — Encounter: Payer: Self-pay | Admitting: Student

## 2022-03-30 VITALS — BP 138/72 | HR 65 | Ht 63.0 in | Wt 127.3 lb

## 2022-03-30 DIAGNOSIS — D6869 Other thrombophilia: Secondary | ICD-10-CM | POA: Diagnosis not present

## 2022-03-30 DIAGNOSIS — I10 Essential (primary) hypertension: Secondary | ICD-10-CM | POA: Diagnosis not present

## 2022-03-30 DIAGNOSIS — I480 Paroxysmal atrial fibrillation: Secondary | ICD-10-CM

## 2022-03-30 NOTE — Patient Instructions (Signed)
Medication Instructions:  Your physician recommends that you continue on your current medications as directed. Please refer to the Current Medication list given to you today.  *If you need a refill on your cardiac medications before your next appointment, please call your pharmacy*   Lab Work: None If you have labs (blood work) drawn today and your tests are completely normal, you will receive your results only by: [place] (if you have MyChart) OR A paper copy in the mail If you have any lab test that is abnormal or we need to change your treatment, we will call you to review the results.   Follow-Up: At [facility], you and your health needs are our priority.  As part of our continuing mission to provide you with exceptional heart care, we have created designated Provider Care Teams.  These Care Teams include your primary Cardiologist (physician) and Advanced Practice Providers (APPs -  Physician Assistants and Nurse Practitioners) who all work together to provide you with the care you need, when you need it.   Your next appointment:   06/22/2022

## 2022-03-30 NOTE — Progress Notes (Signed)
PCP:  Jonathon Jordan, MD Primary Cardiologist: Skeet Latch, MD Electrophysiologist: Vickie Epley, MD   Ashlee Flores is a 81 y.o. female seen today for Vickie Epley, MD for routine electrophysiology followup.  Since last being seen in our clinic the patient reports doing OK.  She has not started the toprol as she was concerned about side effects. Similarly, she has not started the protonix because they were going on a trip and didn't want to start a new one out of the country.  She was mostly concerned about the toprol because previously she took a prn dose of lopressor prior to a syncopal episode she had. This episode occurred when getting out of bed from sleep, after having taken the prn lopressor for AF earlier in the evening. She denies any further syncope.   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30   Diastolic dysfunction 02/16/4853   Dysrhythmia    PAF   Essential hypertension 06/04/2016   Family history of breast cancer    Family history of ovarian cancer    Palpitations 06/04/2016   Paroxysmal atrial fibrillation ([place]) 08/22/2016   Past Surgical History:  Procedure Laterality Date   ANTERIOR CERVICAL DECOMP/DISCECTOMY FUSION N/A 07/22/2020   Procedure: Anterior Cervical Discectomy Fusion Cervical Four-Five, Cervical Five- Six, Cervical Six-Seven;  Surgeon: Earnie Larsson, MD;  Location: [place];  Service: Neurosurgery;  Laterality: N/A;  Anterior Cervical Discectomy Fusion Cervical Four-Five, Cervical Five- Six, Cervical Six-Seven   BREAST EXCISIONAL BIOPSY Right    BREAST SURGERY Right    LUMPECTOMY   DILATION AND CURETTAGE OF UTERUS     EYE SURGERY Left    CATARACT   SALPINGOOPHORECTOMY     preventative, due to FH of ovarian cancer    Current Outpatient Medications  Medication Sig Dispense Refill   acetaminophen (TYLENOL) 500 MG tablet Take 500-1,000 mg by mouth every 6 (six) hours as needed  for mild pain.      apixaban (ELIQUIS) 5 MG TABS tablet TAKE 1 TABLET (5 MG TOTAL) BY MOUTH 2 (TWO) TIMES DAILY. 180 tablet 3   Calcium Carb-Cholecalciferol (CALCIUM 600/VITAMIN D3 PO) Take 1 tablet by mouth daily.     diltiazem (CARDIZEM) 30 MG tablet CARDIZEM '30MG'$  -- TAKE 1/2 TO 1 TABLET EVERY 4 HOURS AS NEEDED FOR AFIB HEART RATE >100 AS LONG AS TOP BLOOD PRESSURE >100. (Patient taking differently: Cardizem '30mg'$  -- take 1/2 to 1 tablet every 4 hours AS NEEDED for afib  heart rate >100 as long as top blood pressure >100.) 45 tablet 1   flecainide (TAMBOCOR) 50 MG tablet TAKE 1 TABLET BY MOUTH EVERY 12 HOURS 180 tablet 3   hydrochlorothiazide (HYDRODIURIL) 25 MG tablet Take 1 tablet (25 mg total) by mouth daily. 90 tablet 1   lisinopril (PRINIVIL,ZESTRIL) 10 MG tablet Take 10 mg by mouth daily.      menthol-cetylpyridinium (CEPACOL) 3 MG lozenge Take 1 lozenge (3 mg total) by mouth as needed for sore throat. 60 tablet 3   metoprolol succinate (TOPROL XL) 25 MG 24 hr tablet Take 0.5 tablets (12.5 mg total) by mouth at bedtime. 45 tablet 3   pantoprazole (PROTONIX) 40 MG tablet Take 1 tablet (40 mg total) by mouth daily. 90 tablet 3   Potassium Chloride ER 20 MEQ TBCR Take 20 mEq by mouth in the morning and at bedtime. 180 tablet 3  No current facility-administered medications for this visit.    Allergies  Allergen Reactions   Penicillins Anaphylaxis    Social History   Socioeconomic History   Marital status: Married    Spouse name: Not on file   Number of children: 4   Years of education: Not on file   Highest education level: Not on file  Occupational History   Not on file  Tobacco Use   Smoking status: Never   Smokeless tobacco: Never  Vaping Use   Vaping Use: Never used  Substance and Sexual Activity   Alcohol use: Not Currently   Drug use: No   Sexual activity: Not on file  Other Topics Concern   Not on file  Social History Narrative   Not on file   Social  Determinants of Health   Financial Resource Strain: Not on file  Food Insecurity: Not on file  Transportation Needs: Not on file  Physical Activity: Not on file  Stress: Not on file  Social Connections: Not on file  Intimate Partner Violence: Not on file     Review of Systems: All other systems reviewed and are otherwise negative except as noted above.  Physical Exam: There were no vitals filed for this visit.  GEN- The patient is well appearing, alert and oriented x 3 today.   HEENT: normocephalic, atraumatic; sclera clear, conjunctiva pink; hearing intact; oropharynx clear; neck supple, no JVP Lymph- no cervical lymphadenopathy Lungs- Clear to ausculation bilaterally, normal work of breathing.  No wheezes, rales, rhonchi Heart- Regular rate and rhythm, no murmurs, rubs or gallops, PMI not laterally displaced GI- soft, non-tender, non-distended, bowel sounds present, no hepatosplenomegaly Extremities- no clubbing, cyanosis, or edema; DP/PT/radial pulses 2+ bilaterally MS- no significant deformity or atrophy Skin- warm and dry, no rash or lesion Psych- euthymic mood, full affect Neuro- strength and sensation are intact  EKG is not ordered.   Additional studies reviewed include: Previous EP office notes.   Assessment and Plan:  Paroxysmal atrial fibrillation Overall has had low burden of AF Continue flecainide 50 mg by mouth twice daily and prn cardizem. Long discussion regarding low dose toprol. Pt is agreeable. Will follow HRs closely. If intolerable, will likely need to consider tikosyn vs amiodarone She will continue Eliquis for stroke prophylaxis.  2. HTN Stable on current regimen   Follow up with EP APP in 3 months   Shirley Friar, [place]  03/30/22 8:31 AM

## 2022-04-20 ENCOUNTER — Ambulatory Visit
Admission: RE | Admit: 2022-04-20 | Discharge: 2022-04-20 | Disposition: A | Discharge status: Home / Self Care | Payer: Medicare Other | Source: Ambulatory Visit | Attending: Family Medicine | Admitting: Family Medicine

## 2022-04-20 DIAGNOSIS — E2839 Other primary ovarian failure: Secondary | ICD-10-CM

## 2022-04-20 DIAGNOSIS — M85832 Other specified disorders of bone density and structure, left forearm: Secondary | ICD-10-CM | POA: Diagnosis not present

## 2022-04-20 DIAGNOSIS — M810 Age-related osteoporosis without current pathological fracture: Secondary | ICD-10-CM | POA: Diagnosis not present

## 2022-05-17 ENCOUNTER — Telehealth: Payer: Self-pay | Admitting: Cardiovascular Disease

## 2022-05-17 MED ORDER — FLECAINIDE ACETATE 50 MG PO TABS: 50.0000 mg | ORAL_TABLET | Freq: Two times a day (BID) | ORAL | 3 refills | Status: DC

## 2022-05-17 NOTE — Telephone Encounter (Signed)
*  STAT* If patient is at the pharmacy, call can be transferred to refill team.   1. Which medications need to be refilled? (please list name of each medication and dose if known) flecainide (TAMBOCOR) 50 MG tablet   2. Which pharmacy/location (including street and city if local pharmacy) is medication to be sent to?   [address], [place] [address] [phone]   3. Do they need a 30 day or 90 day supply? [place]

## 2022-05-17 NOTE — Telephone Encounter (Signed)
Rx request sent to pharmacy.  

## 2022-06-11 DIAGNOSIS — Z23 Encounter for immunization: Secondary | ICD-10-CM | POA: Diagnosis not present

## 2022-06-22 ENCOUNTER — Ambulatory Visit: Payer: Medicare Other | Admitting: Student

## 2022-07-01 NOTE — Progress Notes (Deleted)
PCP:  Jonathon Jordan, MD Primary Cardiologist: Skeet Latch, MD Electrophysiologist: Vickie Epley, MD   Ashlee Flores is a 81 y.o. female seen today for Vickie Epley, MD for routine electrophysiology followup. Since last being seen in our clinic the patient reports doing ***.  she denies chest pain, palpitations, dyspnea, PND, orthopnea, nausea, vomiting, dizziness, syncope, edema, weight gain, or early satiety.   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 11   Diastolic dysfunction 10/18/4130   Dysrhythmia    PAF   Essential hypertension 06/04/2016   Family history of breast cancer    Family history of ovarian cancer    Palpitations 06/04/2016   Paroxysmal atrial fibrillation ([place]) 08/22/2016   Past Surgical History:  Procedure Laterality Date   ANTERIOR CERVICAL DECOMP/DISCECTOMY FUSION N/A 07/22/2020   Procedure: Anterior Cervical Discectomy Fusion Cervical Four-Five, Cervical Five- Six, Cervical Six-Seven;  Surgeon: Earnie Larsson, MD;  Location: [place];  Service: Neurosurgery;  Laterality: N/A;  Anterior Cervical Discectomy Fusion Cervical Four-Five, Cervical Five- Six, Cervical Six-Seven   BREAST EXCISIONAL BIOPSY Right    BREAST SURGERY Right    LUMPECTOMY   DILATION AND CURETTAGE OF UTERUS     EYE SURGERY Left    CATARACT   SALPINGOOPHORECTOMY     preventative, due to FH of ovarian cancer    Current Outpatient Medications  Medication Sig Dispense Refill   acetaminophen (TYLENOL) 500 MG tablet Take 500-1,000 mg by mouth every 6 (six) hours as needed for mild pain.      apixaban (ELIQUIS) 5 MG TABS tablet TAKE 1 TABLET (5 MG TOTAL) BY MOUTH 2 (TWO) TIMES DAILY. 180 tablet 3   Calcium Carb-Cholecalciferol (CALCIUM 600/VITAMIN D3 PO) Take 1 tablet by mouth daily.     diltiazem (CARDIZEM) 30 MG tablet CARDIZEM '30MG'$  -- TAKE 1/2 TO 1 TABLET EVERY 4 HOURS AS NEEDED FOR AFIB HEART RATE >100 AS LONG AS  TOP BLOOD PRESSURE >100. (Patient taking differently: Cardizem '30mg'$  -- take 1/2 to 1 tablet every 4 hours AS NEEDED for afib  heart rate >100 as long as top blood pressure >100.) 45 tablet 1   flecainide (TAMBOCOR) 50 MG tablet Take 1 tablet (50 mg total) by mouth every 12 (twelve) hours. 180 tablet 3   hydrochlorothiazide (HYDRODIURIL) 25 MG tablet Take 1 tablet (25 mg total) by mouth daily. 90 tablet 1   lisinopril (PRINIVIL,ZESTRIL) 10 MG tablet Take 10 mg by mouth daily.      metoprolol succinate (TOPROL XL) 25 MG 24 hr tablet Take 0.5 tablets (12.5 mg total) by mouth at bedtime. 45 tablet 3   pantoprazole (PROTONIX) 40 MG tablet Take 1 tablet (40 mg total) by mouth daily. 90 tablet 3   Potassium Chloride ER 20 MEQ TBCR Take 20 mEq by mouth in the morning and at bedtime. 180 tablet 3   No current facility-administered medications for this visit.    Allergies  Allergen Reactions   Penicillins Anaphylaxis    Social History   Socioeconomic History   Marital status: Married    Spouse name: Not on file   Number of children: 4   Years of education: Not on file   Highest education level: Not on file  Occupational History   Not on file  Tobacco Use   Smoking status: Never   Smokeless tobacco: Never  Vaping Use   Vaping Use: Never used  Substance and Sexual Activity   Alcohol use: Not Currently   Drug use: No   Sexual activity: Not on file  Other Topics Concern   Not on file  Social History Narrative   Not on file   Social Determinants of Health   Financial Resource Strain: Not on file  Food Insecurity: Not on file  Transportation Needs: Not on file  Physical Activity: Not on file  Stress: Not on file  Social Connections: Not on file  Intimate Partner Violence: Not on file     Review of Systems: All other systems reviewed and are otherwise negative except as noted above.  Physical Exam: There were no vitals filed for this visit.  GEN- The patient is well  appearing, alert and oriented x 3 today.   HEENT: normocephalic, atraumatic; sclera clear, conjunctiva pink; hearing intact; oropharynx clear; neck supple, no JVP Lymph- no cervical lymphadenopathy Lungs- Clear to ausculation bilaterally, normal work of breathing.  No wheezes, rales, rhonchi Heart- {Blank single:19197::"Regular","Irregularly irregular"} rate and rhythm, no murmurs, rubs or gallops, PMI not laterally displaced GI- soft, non-tender, non-distended, bowel sounds present, no hepatosplenomegaly Extremities- {EDEMA YQMGN:00370} peripheral edema. no clubbing or cyanosis; DP/PT/radial pulses 2+ bilaterally MS- no significant deformity or atrophy Skin- warm and dry, no rash or lesion Psych- euthymic mood, full affect Neuro- strength and sensation are intact  EKG is ordered. Personal review of EKG from today shows ***  Additional studies reviewed include: Previous EP office notes. ***  Assessment and Plan:  1. Paroxysmal atrial fibrillation  Overall burden has been low Continue flecainide 50 mg BID Continue prn cardizem Continue Eliquis for stroke prevention  2. HTN Stable on current regimen   Follow up with {EPMDS:28135} in {EPFOLLOW UP:28173}  Shirley Friar, PA-C  07/01/22 2:08 PM

## 2022-07-04 ENCOUNTER — Ambulatory Visit: Payer: Medicare Other | Admitting: Student

## 2022-07-04 DIAGNOSIS — I480 Paroxysmal atrial fibrillation: Secondary | ICD-10-CM

## 2022-07-04 DIAGNOSIS — I10 Essential (primary) hypertension: Secondary | ICD-10-CM

## 2022-07-12 ENCOUNTER — Encounter: Payer: Self-pay | Admitting: Genetic Counselor

## 2022-07-31 NOTE — Progress Notes (Unsigned)
PCP:  Jonathon Jordan, MD Primary Cardiologist: Skeet Latch, MD Electrophysiologist: Vickie Epley, MD   Ashlee Flores is a 81 y.o. female seen today for Vickie Epley, MD for routine electrophysiology followup. Since last being seen in our clinic the patient reports doing well overall.  she denies chest pain, palpitations, dyspnea, PND, orthopnea, nausea, vomiting, dizziness, syncope, edema, weight gain, or early satiety.   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 15   Diastolic dysfunction 04/17/4165   Dysrhythmia    PAF   Essential hypertension 06/04/2016   Family history of breast cancer    Family history of ovarian cancer    Palpitations 06/04/2016   Paroxysmal atrial fibrillation ([place]) 08/22/2016   Past Surgical History:  Procedure Laterality Date   ANTERIOR CERVICAL DECOMP/DISCECTOMY FUSION N/A 07/22/2020   Procedure: Anterior Cervical Discectomy Fusion Cervical Four-Five, Cervical Five- Six, Cervical Six-Seven;  Surgeon: Earnie Larsson, MD;  Location: [facility] [place];  Service: Neurosurgery;  Laterality: N/A;  Anterior Cervical Discectomy Fusion Cervical Four-Five, Cervical Five- Six, Cervical Six-Seven   BREAST EXCISIONAL BIOPSY Right    BREAST SURGERY Right    LUMPECTOMY   DILATION AND CURETTAGE OF UTERUS     EYE SURGERY Left    CATARACT   SALPINGOOPHORECTOMY     preventative, due to FH of ovarian cancer    Current Outpatient Medications  Medication Sig Dispense Refill   acetaminophen (TYLENOL) 500 MG tablet Take 500-1,000 mg by mouth every 6 (six) hours as needed for mild pain.      apixaban (ELIQUIS) 5 MG TABS tablet TAKE 1 TABLET (5 MG TOTAL) BY MOUTH 2 (TWO) TIMES DAILY. 180 tablet 3   Calcium Carb-Cholecalciferol (CALCIUM 600/VITAMIN D3 PO) Take 1 tablet by mouth daily.     diltiazem (CARDIZEM) 30 MG tablet CARDIZEM '30MG'$  -- TAKE 1/2 TO 1 TABLET EVERY 4 HOURS AS NEEDED FOR AFIB HEART RATE >100 AS  LONG AS TOP BLOOD PRESSURE >100. (Patient taking differently: Cardizem '30mg'$  -- take 1/2 to 1 tablet every 4 hours AS NEEDED for afib  heart rate >100 as long as top blood pressure >100.) 45 tablet 1   flecainide (TAMBOCOR) 50 MG tablet Take 1 tablet (50 mg total) by mouth every 12 (twelve) hours. 180 tablet 3   hydrochlorothiazide (HYDRODIURIL) 25 MG tablet Take 1 tablet (25 mg total) by mouth daily. 90 tablet 1   lisinopril (PRINIVIL,ZESTRIL) 10 MG tablet Take 10 mg by mouth daily.      metoprolol succinate (TOPROL XL) 25 MG 24 hr tablet Take 0.5 tablets (12.5 mg total) by mouth at bedtime. 45 tablet 3   pantoprazole (PROTONIX) 40 MG tablet Take 1 tablet (40 mg total) by mouth daily. (Patient taking differently: Take 40 mg by mouth as needed.) 90 tablet 3   Potassium Chloride ER 20 MEQ TBCR Take 20 mEq by mouth in the morning and at bedtime. 180 tablet 3   No current facility-administered medications for this visit.    Allergies  Allergen Reactions   Penicillins Anaphylaxis    Social History   Socioeconomic History   Marital status: Married    Spouse name: Not on file   Number of children: 4   Years of education: Not on file   Highest education level: Not on file  Occupational History   Not on file  Tobacco Use   Smoking status: Never   Smokeless  tobacco: Never  Vaping Use   Vaping Use: Never used  Substance and Sexual Activity   Alcohol use: Not Currently   Drug use: No   Sexual activity: Not on file  Other Topics Concern   Not on file  Social History Narrative   Not on file   Social Determinants of Health   Financial Resource Strain: Not on file  Food Insecurity: Not on file  Transportation Needs: Not on file  Physical Activity: Not on file  Stress: Not on file  Social Connections: Not on file  Intimate Partner Violence: Not on file     Review of Systems: All other systems reviewed and are otherwise negative except as noted above.  Physical Exam: Vitals:    08/01/22 0933  BP: 132/72  Pulse: (!) 49  SpO2: 92%  Weight: 129 lb (58.5 kg)  Height: '5\' 3"'$  (1.6 m)    GEN- The patient is well appearing, alert and oriented x 3 today.   HEENT: normocephalic, atraumatic; sclera clear, conjunctiva pink; hearing intact; oropharynx clear; neck supple, no JVP Lymph- no cervical lymphadenopathy Lungs- Clear to ausculation bilaterally, normal work of breathing.  No wheezes, rales, rhonchi Heart- Regular rate and rhythm, no murmurs, rubs or gallops, PMI not laterally displaced GI- soft, non-tender, non-distended, bowel sounds present, no hepatosplenomegaly Extremities- No peripheral edema. no clubbing or cyanosis; DP/PT/radial pulses 2+ bilaterally MS- no significant deformity or atrophy Skin- warm and dry, no rash or lesion Psych- euthymic mood, full affect Neuro- strength and sensation are intact  EKG is ordered. Personal review of EKG from today shows sinus brady at 49 bpm.  Additional studies reviewed include: Previous EP notes.   Assessment and Plan:  1. Paroxysmal atrial fibrillation  Overall low burden, <1% by prior monitor. EKG today shows NSR Continue low dose toprol for now. If intolerable, will need to consider alternate AAD vs pacing  2. HTN Stable on current regimen   Follow up with Dr. Quentin Ore in 6 months  Shirley Friar, PA-C  08/01/22 9:46 AM

## 2022-08-01 ENCOUNTER — Encounter: Payer: Self-pay | Admitting: Student

## 2022-08-01 ENCOUNTER — Ambulatory Visit: Payer: Medicare Other | Attending: Student | Admitting: Student

## 2022-08-01 VITALS — BP 132/72 | HR 49 | Ht 63.0 in | Wt 129.0 lb

## 2022-08-01 DIAGNOSIS — I10 Essential (primary) hypertension: Secondary | ICD-10-CM | POA: Insufficient documentation

## 2022-08-01 DIAGNOSIS — I480 Paroxysmal atrial fibrillation: Secondary | ICD-10-CM | POA: Insufficient documentation

## 2022-08-01 NOTE — Patient Instructions (Signed)
Medication Instructions:  Your physician recommends that you continue on your current medications as directed. Please refer to the Current Medication list given to you today.  *If you need a refill on your cardiac medications before your next appointment, please call your pharmacy*   Lab Work: None If you have labs (blood work) drawn today and your tests are completely normal, you will receive your results only by: [place] (if you have MyChart) OR A paper copy in the mail If you have any lab test that is abnormal or we need to change your treatment, we will call you to review the results.   Follow-Up: At [facility], you and your health needs are our priority.  As part of our continuing mission to provide you with exceptional heart care, we have created designated Provider Care Teams.  These Care Teams include your primary Cardiologist (physician) and Advanced Practice Providers (APPs -  Physician Assistants and Nurse Practitioners) who all work together to provide you with the care you need, when you need it.  Your next appointment:   6 month(s)  The format for your next appointment:   In Person  Provider:   Lars Mage, MD     Important Information About Sugar

## 2022-09-03 ENCOUNTER — Other Ambulatory Visit: Payer: Self-pay | Admitting: Cardiology

## 2022-09-07 ENCOUNTER — Encounter (HOSPITAL_BASED_OUTPATIENT_CLINIC_OR_DEPARTMENT_OTHER): Payer: Self-pay | Admitting: Family

## 2022-09-07 ENCOUNTER — Ambulatory Visit (INDEPENDENT_AMBULATORY_CARE_PROVIDER_SITE_OTHER): Payer: Medicare Other | Admitting: Family

## 2022-09-07 VITALS — BP 102/62 | HR 65 | Ht 62.0 in | Wt 128.1 lb

## 2022-09-07 DIAGNOSIS — I952 Hypotension due to drugs: Secondary | ICD-10-CM | POA: Diagnosis not present

## 2022-09-07 DIAGNOSIS — D6859 Other primary thrombophilia: Secondary | ICD-10-CM

## 2022-09-07 DIAGNOSIS — I10 Essential (primary) hypertension: Secondary | ICD-10-CM | POA: Diagnosis not present

## 2022-09-07 DIAGNOSIS — I480 Paroxysmal atrial fibrillation: Secondary | ICD-10-CM | POA: Diagnosis not present

## 2022-09-07 MED ORDER — APIXABAN 2.5 MG PO TABS: 2.5000 mg | ORAL_TABLET | Freq: Two times a day (BID) | ORAL | 3 refills | Status: DC

## 2022-09-07 NOTE — Patient Instructions (Addendum)
Medication Instructions:  Continue your current medications.   If you have to take an as needed Diltiazem, before your next dose of Hydrochlorothiazide and Lisinopril check your blood pressure.   If systolic blood pressure (top number) is more than 120, take both Hydrochlorothiazide and Lisinopril.  If systolic blood pressure (top number) is less than 120, take just LIsiopril.   If systolic blood pressure (top number) is less than 100, do not take Hydrochlorothiazide or Lisinopril.   *If you need a refill on your cardiac medications before your next appointment, please call your pharmacy*   Lab Work: Your physician recommends that you return for lab work Monday or Tuesday of next week for BMP, magnesium, CBC, TSH  Please return for Lab work. You may come to the...   [facility] [address] [place], [place], [place] [address] Open: 8am-1pm, then 2pm-4:30pm   If you have labs (blood work) drawn today and your tests are completely normal, you will receive your results only by: [place] (if you have MyChart) OR A paper copy in the mail If you have any lab test that is abnormal or we need to change your treatment, we will call you to review the results.   Testing/Procedures: Your EKG today showed normal sinus rhythm with a rate of 65 bpm   Follow-Up: At [facility], you and your health needs are our priority.  As part of our continuing mission to provide you with exceptional heart care, we have created designated Provider Care Teams.  These Care Teams include your primary Cardiologist (physician) and Advanced Practice Providers (APPs -  Physician Assistants and Nurse Practitioners) who all work together to provide you with the care you need, when you need it.  We recommend signing up for the patient portal called "MyChart".  Sign up information is provided on this After Visit Summary.  MyChart is used to connect with  patients for Virtual Visits (Telemedicine).  Patients are able to view lab/test results, encounter notes, upcoming appointments, etc.  Non-urgent messages can be sent to your provider as well.   To learn more about what you can do with MyChart, go to [email or web address].    Your next appointment:   6-8 month(s)  The format for your next appointment:   In Person  Provider:   Skeet Latch, MD    Other Instructions  To prevent palpitations: Make sure you are adequately hydrated.  Avoid and/or limit caffeine containing beverages like soda or tea. Exercise regularly.  Manage stress well. Some over the counter medications can cause palpitations such as Benadryl, AdvilPM, TylenolPM. Regular Advil or Tylenol do not cause palpitations.

## 2022-09-07 NOTE — Progress Notes (Signed)
Office Visit    Patient Name: Ashlee Flores Date of Encounter: 09/07/2022  PCP:  Jonathon Jordan, [place]  Cardiologist:  Skeet Latch, MD  Advanced Practice Provider:  No care team member to display Electrophysiologist:  Vickie Epley, MD      Chief Complaint    Ashlee Flores is a 81 y.o. female presents today for episode of atrial fibrillation.   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43   Diastolic dysfunction 02/13/629   Dysrhythmia    PAF   Essential hypertension 06/04/2016   Family history of breast cancer    Family history of ovarian cancer    Palpitations 06/04/2016   Paroxysmal atrial fibrillation ([place]) 08/22/2016   Past Surgical History:  Procedure Laterality Date   ANTERIOR CERVICAL DECOMP/DISCECTOMY FUSION N/A 07/22/2020   Procedure: Anterior Cervical Discectomy Fusion Cervical Four-Five, Cervical Five- Six, Cervical Six-Seven;  Surgeon: Earnie Larsson, MD;  Location: [facility];  Service: Neurosurgery;  Laterality: N/A;  Anterior Cervical Discectomy Fusion Cervical Four-Five, Cervical Five- Six, Cervical Six-Seven   BREAST EXCISIONAL BIOPSY Right    BREAST SURGERY Right    LUMPECTOMY   DILATION AND CURETTAGE OF UTERUS     EYE SURGERY Left    CATARACT   SALPINGOOPHORECTOMY     preventative, due to [place] of ovarian cancer    Allergies  Allergies  Allergen Reactions   Penicillins Anaphylaxis    History of Present Illness    Ashlee Flores is a 81 y.o. female with a hx of paroxysmal atrial fibrillation, hypertension last seen 08/01/22.  Prior monitor June 2023 showed paroxysmal atrial fibrillation with less than 1% burden.  She follows with Dr. Quentin Ore and EP team and has been maintained on flecainide and Toprol.  She was last seen 08/01/2022 by Oda Kilts, PA maintaining sinus rhythm recommend to continue low-dose Toprol and  flecainide.  Her blood pressure was well-controlled.  She presents today independently. Had episode of atrial fibrillation last night with heart rate in the 130s.  Symptomatic with tachycardia as well as some left arm discomfort.  She took a PRN diltiazem around 5 AM. No new stressors. Avoids caffeine. Stays well hydrated.  No proarrhythmic agents.  She notes after she took the diltiazem the atrial fibrillation resolved shortly thereafter.  She does feel a bit weak, fatigued today which we discussed is likely related to her low blood pressure which is likely from taking her morning hydrochlorothiazide, lisinopril in addition to as needed diltiazem.  Reports no exertional dyspnea, chest pain, edema, orthopnea, PND.  EKGs/Labs/Other Studies Reviewed:   The following studies were reviewed today: Monitor 02/14/22 HR 39 - 167 bpm, average 56 bpm. 9 SVT, longest 7 beats <1% burden of AF, average rate 123 bpm. Longest episode of AF 2 hours 16 minutes Symptom triggered events correspond to AF. Rare supraventricular and ventricular ectopy.   Echo 03/2020  1. Left ventricular ejection fraction, by estimation, is 60 to 65%. The  left ventricle has normal function. The left ventricle has no regional  wall motion abnormalities. Left ventricular diastolic parameters are  consistent with Grade II diastolic  dysfunction (pseudonormalization).   2. Right ventricular systolic function is normal. The right ventricular  size is normal. There is mildly elevated pulmonary artery systolic  pressure. The estimated right ventricular systolic pressure is 42.9  mmHg.   3. Left atrial size was mildly dilated.   4. The mitral valve is normal in structure. Trivial mitral valve  regurgitation. No evidence of mitral stenosis.   5. Tricuspid valve regurgitation is mild to moderate.   6. The aortic valve is tricuspid. Aortic valve regurgitation is mild.  Mild aortic valve sclerosis is present, with no evidence of aortic  valve  stenosis.   7. The inferior vena cava is dilated in size with <50% respiratory  variability, suggesting right atrial pressure of 15 mmHg.    Myoview 04/2020 The left ventricular ejection fraction is normal (55-65%). Nuclear stress EF: 62%. There was no ST segment deviation noted during stress. The study is normal. This is a low risk study.  EKG:  EKG is ordered today.  The ekg ordered today demonstrates NSR 65 bpm with no acute ST/T wave change.  Recent Labs: No results found for requested labs within last 365 days.  Recent Lipid Panel    Component Value Date/Time   CHOL 189 08/31/2019 1030   TRIG 73 08/31/2019 1030   HDL 84 08/31/2019 1030   CHOLHDL 2.3 08/31/2019 1030   LDLCALC 92 08/31/2019 1030    Risk Assessment/Calculations:   CHA2DS2-VASc Score = 4   This indicates a 4.8% annual risk of stroke. The patient's score is based upon: CHF History: 0 HTN History: 1 Diabetes History: 0 Stroke History: 0 Vascular Disease History: 0 Age Score: 2 Gender Score: 1     Home Medications   Current Meds  Medication Sig   acetaminophen (TYLENOL) 500 MG tablet Take 500-1,000 mg by mouth every 6 (six) hours as needed for mild pain.    apixaban (ELIQUIS) 5 MG TABS tablet TAKE 1 TABLET (5 MG TOTAL) BY MOUTH 2 (TWO) TIMES DAILY.   Calcium Carb-Cholecalciferol (CALCIUM 600/VITAMIN D3 PO) Take 1 tablet by mouth daily.   diltiazem (CARDIZEM) 30 MG tablet CARDIZEM '30MG'$  -- TAKE 1/2 TO 1 TABLET EVERY 4 HOURS AS NEEDED FOR AFIB HEART RATE >100 AS LONG AS TOP BLOOD PRESSURE >100. (Patient taking differently: Cardizem '30mg'$  -- take 1/2 to 1 tablet every 4 hours AS NEEDED for afib  heart rate >100 as long as top blood pressure >100.)   flecainide (TAMBOCOR) 50 MG tablet Take 1 tablet (50 mg total) by mouth every 12 (twelve) hours.   hydrochlorothiazide (HYDRODIURIL) 25 MG tablet Take 1 tablet (25 mg total) by mouth daily.   lisinopril (PRINIVIL,ZESTRIL) 10 MG tablet Take 10 mg by mouth  daily.    metoprolol succinate (TOPROL XL) 25 MG 24 hr tablet Take 0.5 tablets (12.5 mg total) by mouth at bedtime.   pantoprazole (PROTONIX) 40 MG tablet Take 1 tablet (40 mg total) by mouth daily. (Patient taking differently: Take 40 mg by mouth as needed.)   Potassium Chloride ER 20 MEQ TBCR Take 20 mEq by mouth at bedtime.     Review of Systems      All other systems reviewed and are otherwise negative except as noted above.  Physical Exam    VS:  BP 102/62 (BP Location: Left Arm)   Pulse 65   Ht '5\' 2"'$  (1.575 m)   Wt 128 lb 1.6 oz (58.1 kg)   SpO2 96%   BMI 23.43 kg/m  , BMI Body mass index is 23.43 kg/m.  Wt Readings from Last 3 Encounters:  09/07/22 128 lb 1.6 oz (58.1 kg)  08/01/22 129 lb (58.5 kg)  03/30/22 127 lb 4.8 oz (57.7 kg)  GEN: Well nourished, well developed, in no acute distress. HEENT: normal. Neck: Supple, no JVD, carotid bruits, or masses. Cardiac: RRR, no murmurs, rubs, or gallops. No clubbing, cyanosis, edema.  Radials/PT 2+ and equal bilaterally.  Respiratory:  Respirations regular and unlabored, clear to auscultation bilaterally. GI: Soft, nontender, nondistended. MS: No deformity or atrophy. Skin: Warm and dry, no rash. Neuro:  Strength and sensation are intact. Psych: Normal affect.  Assessment & Plan    HTN -She is hypotensive today with initial BP 88/56 which improved to 102/62 after eating and drinking something.  Anticipate hypotension related to her PRN diltiazem.   Discussed that if she has to take diltiazem to check her blood pressure before her next dose of lisinopril and hydrochlorothiazide. If systolic less than 124 hold hydrochlorothiazide  If systolic less than 580 hold lisinopril and hydrochlorothiazide.   She is agreeable to monitor her blood pressure at home and will contact us for recurrent hypotension.  She understandably does not feel up for labs today so we will have her do labs Tuesday for BMP, magnesium, CBC, TSH  PAF /  Hypercoagulable state - Episode early this morning of PAF relieved by PRN Diltiazem. Discussed that left arm "heaviness" likely related to tachycardia. Reports no chest pain. This is her first episode since the spring. Monitor 02/2022 <1% burden.  Continue Toprol 12.'5mg'$  QD, Flecainide '50mg'$  BID, PRN Diltiazem.  If she has increased frequency in episodes, discuss alternate AAD with EP.  Labs as above. CHA2DS2-VASc Score = 4 [CHF History: 0, HTN History: 1, Diabetes History: 0, Stroke History: 0, Vascular Disease History: 0, Age Score: 2, Gender Score: 1].  Therefore, the patient's annual risk of stroke is 4.8 %.    No hematuria, melena. Reduce Eliquis to 2.'5mg'$  BID due to age, body habitus. NOted after clinic visit and she was called and verbalized understaoding.       Disposition: Follow up as scheduled with Dr. Quentin Ore in 6-9 month(s) with Skeet Latch, MD or APP.  Signed, Loel Dubonnet, NP 09/07/2022, 1:13 PM [place]

## 2022-09-11 DIAGNOSIS — I480 Paroxysmal atrial fibrillation: Secondary | ICD-10-CM | POA: Diagnosis not present

## 2022-09-11 DIAGNOSIS — I952 Hypotension due to drugs: Secondary | ICD-10-CM | POA: Diagnosis not present

## 2022-09-12 LAB — BASIC METABOLIC PANEL
BUN/Creatinine Ratio: 17 (ref 12–28)
BUN: 12 mg/dL (ref 8–27)
CO2: 24 mmol/L (ref 20–29)
Calcium: 10 mg/dL (ref 8.7–10.3)
Chloride: 97 mmol/L (ref 96–106)
Creatinine, Ser: 0.7 mg/dL (ref 0.57–1.00)
Glucose: 110 mg/dL — ABNORMAL HIGH (ref 70–99)
Potassium: 4.2 mmol/L (ref 3.5–5.2)
Sodium: 137 mmol/L (ref 134–144)
eGFR: 87 mL/min/{1.73_m2} (ref >59)

## 2022-09-12 LAB — CBC
Hematocrit: 42.7 % (ref 34.0–46.6)
Hemoglobin: 14.5 g/dL (ref 11.1–15.9)
MCH: 30.9 pg (ref 26.6–33.0)
MCHC: 34 g/dL (ref 31.5–35.7)
MCV: 91 fL (ref 79–97)
Platelets: 211 10*3/uL (ref 150–450)
RBC: 4.7 x10E6/uL (ref 3.77–5.28)
RDW: 13.3 % (ref 11.7–15.4)
WBC: 6.2 10*3/uL (ref 3.4–10.8)

## 2022-09-12 LAB — TSH: TSH: 2.1 u[IU]/mL (ref 0.450–4.500)

## 2022-09-12 LAB — MAGNESIUM: Magnesium: 1.9 mg/dL (ref 1.6–2.3)

## 2022-10-18 ENCOUNTER — Encounter (HOSPITAL_COMMUNITY): Payer: Self-pay | Admitting: *Deleted

## 2022-11-15 ENCOUNTER — Other Ambulatory Visit: Payer: Self-pay | Admitting: Family Medicine

## 2022-11-15 DIAGNOSIS — Z1231 Encounter for screening mammogram for malignant neoplasm of breast: Secondary | ICD-10-CM

## 2022-12-12 ENCOUNTER — Telehealth: Payer: Self-pay | Admitting: Family

## 2022-12-12 DIAGNOSIS — M5136 Other intervertebral disc degeneration, lumbar region: Secondary | ICD-10-CM | POA: Diagnosis not present

## 2022-12-12 DIAGNOSIS — F3340 Major depressive disorder, recurrent, in remission, unspecified: Secondary | ICD-10-CM | POA: Diagnosis not present

## 2022-12-12 DIAGNOSIS — D6869 Other thrombophilia: Secondary | ICD-10-CM | POA: Diagnosis not present

## 2022-12-12 DIAGNOSIS — I519 Heart disease, unspecified: Secondary | ICD-10-CM | POA: Diagnosis not present

## 2022-12-12 DIAGNOSIS — I272 Pulmonary hypertension, unspecified: Secondary | ICD-10-CM | POA: Diagnosis not present

## 2022-12-12 DIAGNOSIS — E559 Vitamin D deficiency, unspecified: Secondary | ICD-10-CM | POA: Diagnosis not present

## 2022-12-12 DIAGNOSIS — Z79899 Other long term (current) drug therapy: Secondary | ICD-10-CM | POA: Diagnosis not present

## 2022-12-12 DIAGNOSIS — R7301 Impaired fasting glucose: Secondary | ICD-10-CM | POA: Diagnosis not present

## 2022-12-12 DIAGNOSIS — I480 Paroxysmal atrial fibrillation: Secondary | ICD-10-CM | POA: Diagnosis not present

## 2022-12-12 DIAGNOSIS — Z9889 Other specified postprocedural states: Secondary | ICD-10-CM | POA: Diagnosis not present

## 2022-12-12 DIAGNOSIS — I10 Essential (primary) hypertension: Secondary | ICD-10-CM | POA: Diagnosis not present

## 2022-12-12 DIAGNOSIS — Z0000 Encounter for general adult medical examination without abnormal findings: Secondary | ICD-10-CM | POA: Diagnosis not present

## 2022-12-12 NOTE — Telephone Encounter (Signed)
Spoke with patient and relayed advice/info per [facility] NP. She verbalized understanding.

## 2022-12-12 NOTE — Telephone Encounter (Signed)
Pt c/o medication issue:  1. Name of Medication:   apixaban (ELIQUIS) 2.5 MG TABS tablet    2. How are you currently taking this medication (dosage and times per day)?  Take 1 tablet (2.5 mg total) by mouth 2 (two) times daily.  3. Are you having a reaction (difficulty breathing--STAT)? No  4. What is your medication issue? Pt states that she was given Samples of medication but the samples are for 5 MG. She would like to know if she is able to cut in half? Pt also wants to know why her dosage was changed from 5 MG to the 2.5 MG that she's on now? She would like a callback regarding this matter. Please advise

## 2022-12-12 NOTE — Telephone Encounter (Signed)
Per last office visit note, dose changed after visit and patient made aware. Okay to split tablets or arrange for patient to get new samples?

## 2022-12-12 NOTE — Telephone Encounter (Signed)
We use lower dose of Eliquis when people are both 82 years old and have a weight of less than 60kg (133 lbs). The 5mg  tablet would be too strong for her and could increase risk of bleeding.   Recommend against splitting pills in half as is difficult to get exactly a half tablet to ensure she has the right dose of Eliquis. Can provide samples of 2.5mg  tablets if needed and dispose of 5mg  tablets.   Loel Dubonnet, NP

## 2022-12-28 ENCOUNTER — Ambulatory Visit
Admission: RE | Admit: 2022-12-28 | Discharge: 2022-12-28 | Disposition: A | Discharge status: Home / Self Care | Payer: Medicare Other | Source: Ambulatory Visit | Attending: Family Medicine | Admitting: Family Medicine

## 2022-12-28 DIAGNOSIS — Z1231 Encounter for screening mammogram for malignant neoplasm of breast: Secondary | ICD-10-CM

## 2023-01-21 NOTE — Progress Notes (Unsigned)
  Electrophysiology Office Follow up Visit Note:    Date:  01/21/2023   ID:  Kitrina Schubel, DOB 1940-12-13, MRN [id number]  PCP:  Mila Palmer, MD  [facility] Cardiologist:  Chilton Si, MD  [facility] Electrophysiologist:  Lanier Prude, MD    Interval History:    Ashlee Flores is a 82 y.o. female who presents for a follow up visit.   Last seen February 14, 2022 for paroxysmal atrial fibrillation.  At the time of my last appointment with her she was on flecainide 50 mg by mouth twice daily in addition to Cardizem.  She took Eliquis for stroke prophylaxis. She saw Mardelle Matte in follow-up on August 01, 2022.  At that appointment she was doing well without significant burden of atrial fibrillation.      Past medical, surgical, social and family history were reviewed.  ROS:   Please see the history of present illness.    All other systems reviewed and are negative.  EKGs/Labs/Other Studies Reviewed:    The following studies were reviewed today:    EKG:  The ekg ordered today demonstrates ***   Physical Exam:    VS:  There were no vitals taken for this visit.    Wt Readings from Last 3 Encounters:  09/07/22 128 lb 1.6 oz (58.1 kg)  08/01/22 129 lb (58.5 kg)  03/30/22 127 lb 4.8 oz (57.7 kg)     GEN: *** Well nourished, well developed in no acute distress CARDIAC: ***RRR, no murmurs, rubs, gallops RESPIRATORY:  Clear to auscultation without rales, wheezing or rhonchi       ASSESSMENT:    1. PAF (paroxysmal atrial fibrillation) (HCC)   2. Primary hypertension   3. Encounter for long-term (current) use of high-risk medication    PLAN:    In order of problems listed above:   #Paroxysmal atrial fibrillation #High risk drug monitoring-flecainide Low burden on flecainide and metoprolol EKG today stable for continued flecainide use***  #Hypertension *** goal today.  Recommend checking blood pressures 1-2 times per week at home and recording the  values.  Recommend bringing these recordings to the primary care physician.       Total time spent with patient today *** minutes. This includes reviewing records, evaluating the patient and coordinating care.    Signed, Steffanie Dunn, MD, [facility], [facility] 01/21/2023 9:48 AM    Electrophysiology [facility]

## 2023-01-22 ENCOUNTER — Encounter: Payer: Self-pay | Admitting: Cardiology

## 2023-01-22 ENCOUNTER — Ambulatory Visit: Payer: Medicare Other | Attending: Cardiology | Admitting: Cardiology

## 2023-01-22 VITALS — BP 118/68 | HR 54 | Ht 63.0 in | Wt 130.2 lb

## 2023-01-22 DIAGNOSIS — I10 Essential (primary) hypertension: Secondary | ICD-10-CM | POA: Insufficient documentation

## 2023-01-22 DIAGNOSIS — I480 Paroxysmal atrial fibrillation: Secondary | ICD-10-CM | POA: Diagnosis not present

## 2023-01-22 DIAGNOSIS — Z79899 Other long term (current) drug therapy: Secondary | ICD-10-CM | POA: Diagnosis not present

## 2023-01-22 NOTE — Patient Instructions (Signed)
Medication Instructions:  Your physician recommends that you continue on your current medications as directed. Please refer to the Current Medication list given to you today.  *If you need a refill on your cardiac medications before your next appointment, please call your pharmacy*  Follow-Up: At [facility], you and your health needs are our priority.  As part of our continuing mission to provide you with exceptional heart care, we have created designated Provider Care Teams.  These Care Teams include your primary Cardiologist (physician) and Advanced Practice Providers (APPs -  Physician Assistants and Nurse Practitioners) who all work together to provide you with the care you need, when you need it.  Your next appointment:   6 month(s)  Provider:   Francis Dowse, PA-C [facility]im[facility] Needle "[facility]" [place], [place]

## 2023-01-22 NOTE — Progress Notes (Signed)
Electrophysiology Office Follow up Visit Note:    Date:  01/22/2023   ID:  Ashlee Flores, DOB 1941/06/22, MRN [id number]  PCP:  Mila Palmer, MD  [facility] Cardiologist:  Chilton Si, MD  [facility] Electrophysiologist:  Lanier Prude, MD    Interval History:    Ashlee Flores is a 82 y.o. female who presents for a follow up visit.   Last seen February 14, 2022 for paroxysmal atrial fibrillation.  At the time of my last appointment with her she was on flecainide 50 mg by mouth twice daily in addition to Cardizem.  She took Eliquis for stroke prophylaxis. She saw Mardelle Matte in follow-up on August 01, 2022.  At that appointment she was doing well without significant burden of atrial fibrillation.  Today, she reports having one episode of atrial fibrillation in the interim. This randomly occurred one day without any clear triggers. While getting dinner ready she suddenly felt like she was going out of rhythm. This episode lasted for about 4 hours.  Her blood pressure was initially elevated to 148/64, which she states is high for her. On manual recheck, her BP improved to 118/68  She is currently taking her calcium twice a day. She is compliant with her metoprolol at night.  Last Summer she walked for miles every day during a trip to [place]. She didn't experience any significant cardiovascular symptoms during that trip.  She denies any palpitations, chest pain, shortness of breath, or peripheral edema. No lightheadedness, headaches, syncope, orthopnea, or PND.     Past medical, surgical, social and family history were reviewed.  ROS:   Please see the history of present illness.    All other systems reviewed and are negative.  EKGs/Labs/Other Studies Reviewed:    The following studies were reviewed today:    EKG:  The ekg ordered today demonstrates sinus, PR 160 ms, QRS duration 110 ms   Physical Exam:    VS:  BP 118/68 (BP Location: Right Arm, Patient Position:  Sitting, Cuff Size: Normal)   Pulse (!) 54   Ht 5\' 3"  (1.6 m)   Wt 130 lb 3.2 oz (59.1 kg)   SpO2 98%   BMI 23.06 kg/m     Wt Readings from Last 3 Encounters:  01/22/23 130 lb 3.2 oz (59.1 kg)  09/07/22 128 lb 1.6 oz (58.1 kg)  08/01/22 129 lb (58.5 kg)     GEN: Well nourished, well developed in no acute distress CARDIAC: RRR, no murmurs, rubs, gallops RESPIRATORY:  Clear to auscultation without rales, wheezing or rhonchi       ASSESSMENT:    1. PAF (paroxysmal atrial fibrillation) (HCC)   2. Primary hypertension   3. Encounter for long-term (current) use of high-risk medication    PLAN:    In order of problems listed above:   #Paroxysmal atrial fibrillation #High risk drug monitoring-flecainide Low burden on flecainide and metoprolol EKG today stable for continued flecainide use  #Hypertension Recheck at goal today.  Recommend checking blood pressures 1-2 times per week at home and recording the values.  Recommend bringing these recordings to the primary care physician.  Follow-up in 6 months with APP.  I,Mathew Stumpf,acting as a Neurosurgeon for Lanier Prude, MD.,have documented all relevant documentation on the behalf of Lanier Prude, MD,as directed by  Lanier Prude, MD while in the presence of Lanier Prude, MD.  I, Lanier Prude, MD, have reviewed all documentation for this visit. The documentation on 01/22/23  for the exam, diagnosis, procedures, and orders are all accurate and complete.   Signed, Steffanie Dunn, MD, [facility], [facility] 01/22/2023 8:56 AM    Electrophysiology [facility]

## 2023-02-27 ENCOUNTER — Other Ambulatory Visit: Payer: Self-pay | Admitting: Cardiology

## 2023-03-25 IMAGING — MG MM DIGITAL SCREENING BILAT W/ TOMO AND CAD
6 of 10 series · 6 of 30 positions shown · non-contrast
Comparison: Previous exam(s).

CLINICAL DATA: Screening.

EXAM:
DIGITAL SCREENING BILATERAL MAMMOGRAM WITH TOMOSYNTHESIS AND CAD
TECHNIQUE: Bilateral screening digital craniocaudal and mediolateral oblique
mammograms were obtained. Bilateral screening digital breast
tomosynthesis was performed. The images were evaluated with
computer-aided detection.

[L CC synth-2D]
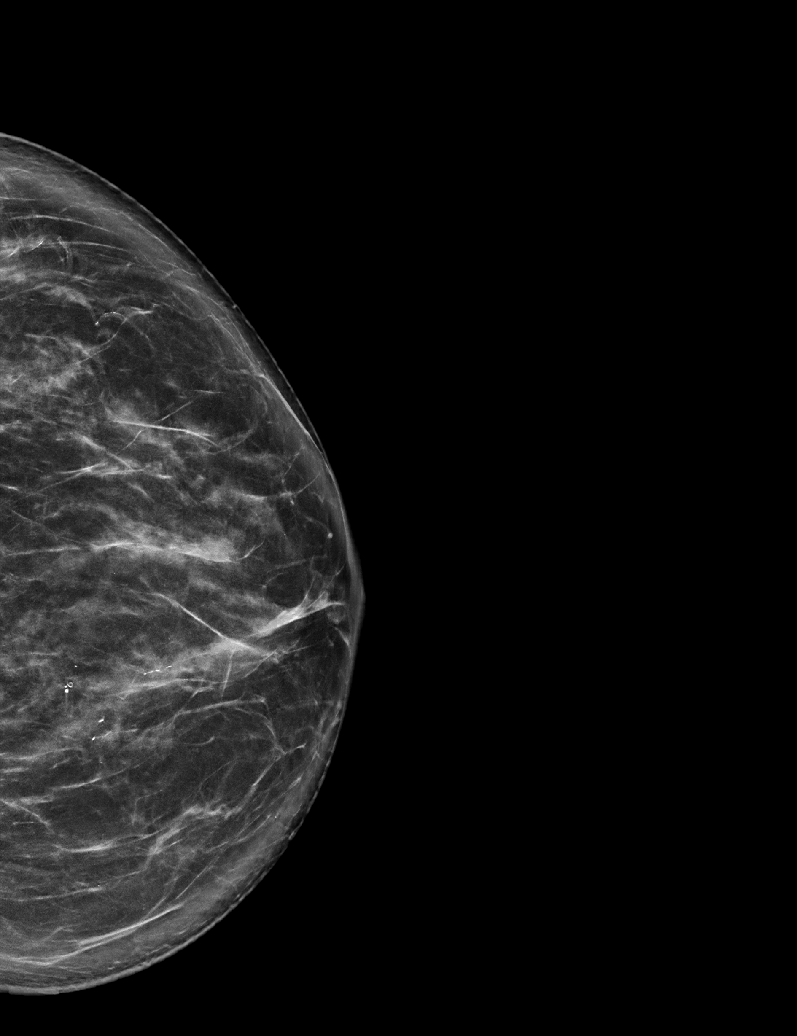

[L MLO synth-2D (1 of 2)]
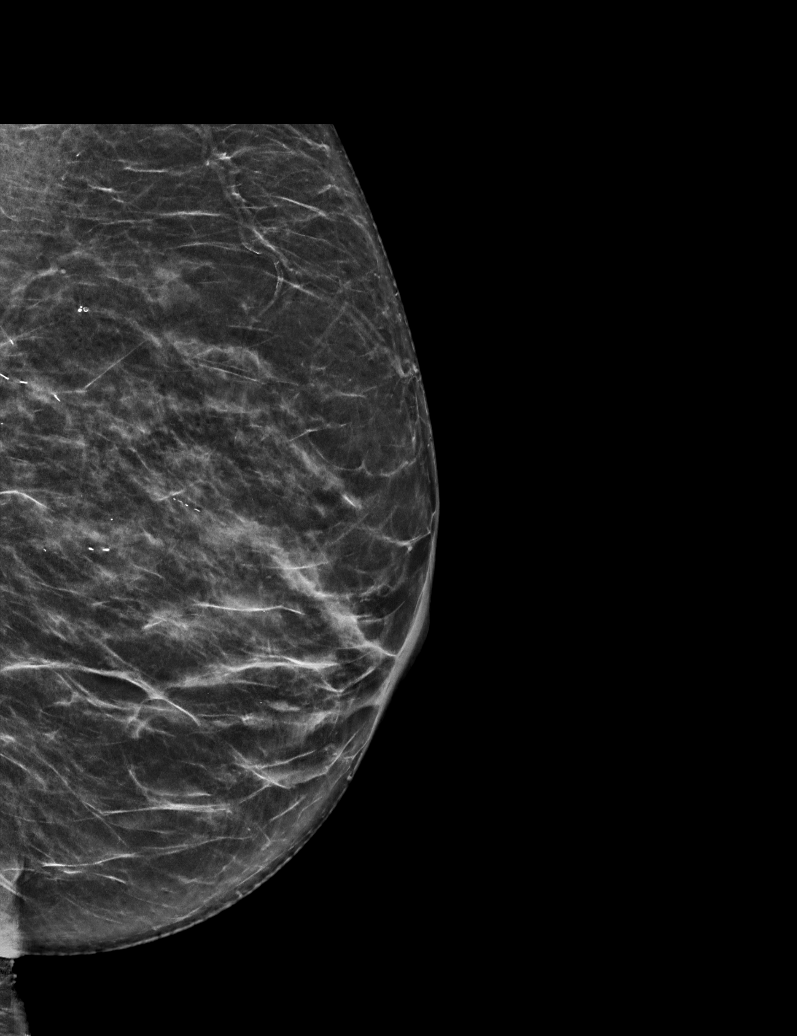

[L MLO synth-2D (2 of 2)]
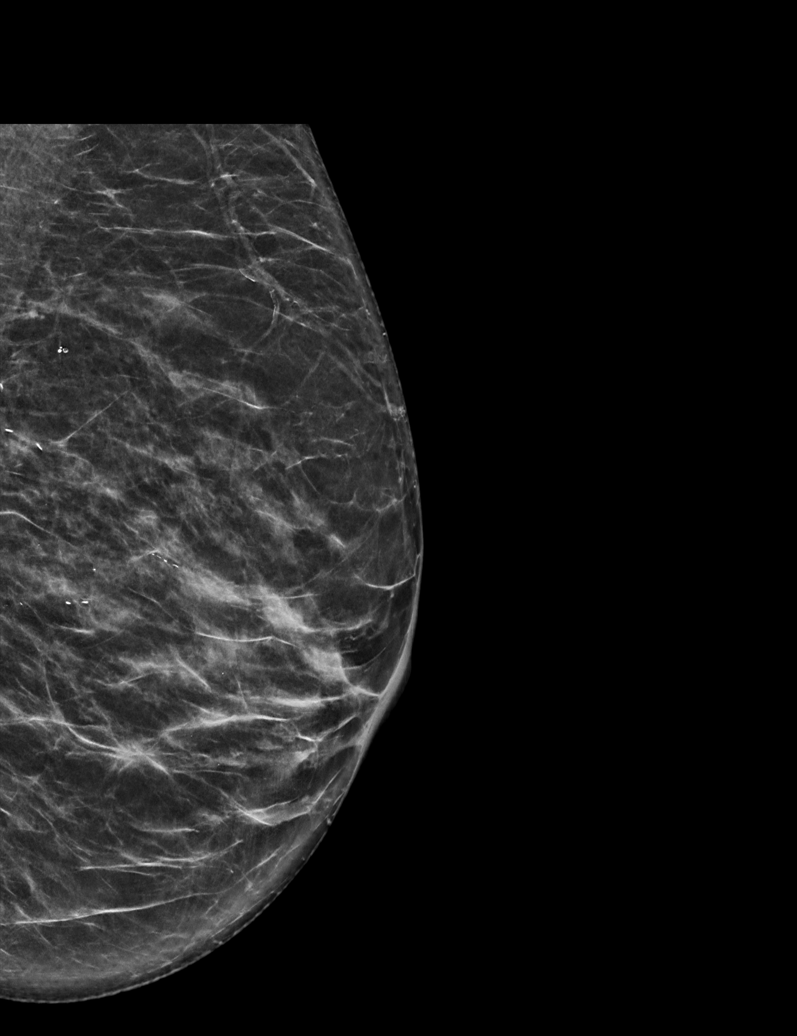

[R MLO synth-2D]
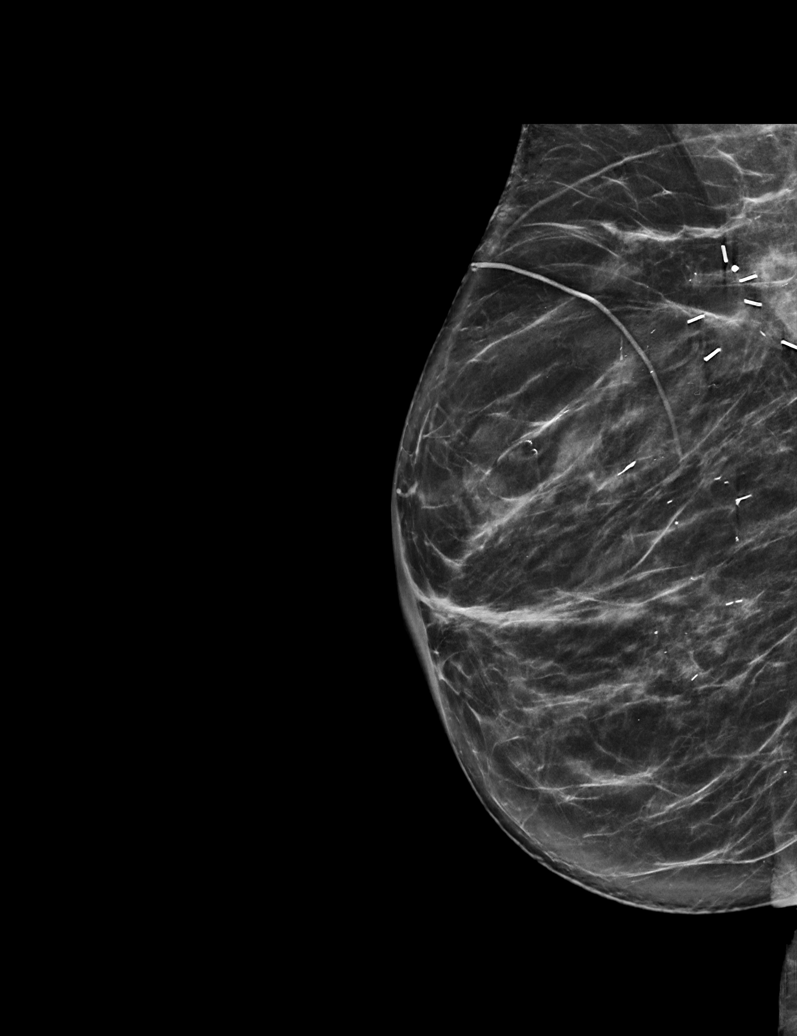

[R CC synth-2D]
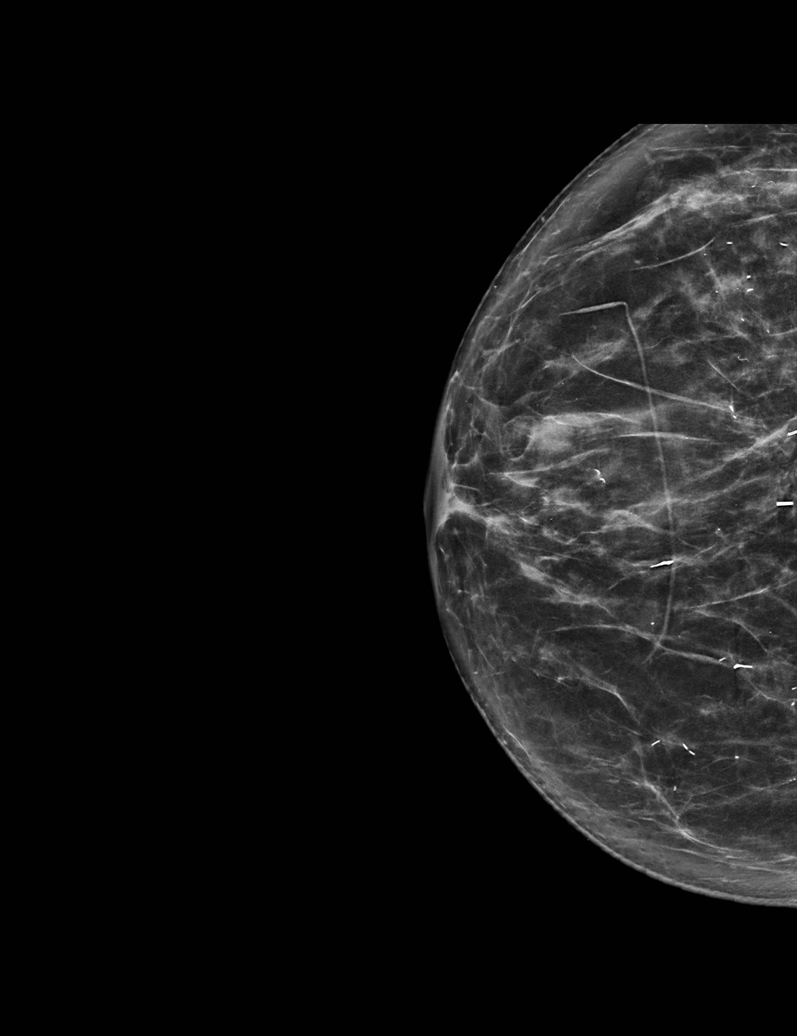

[R MLO tomo · tomo slice 36/71.0]
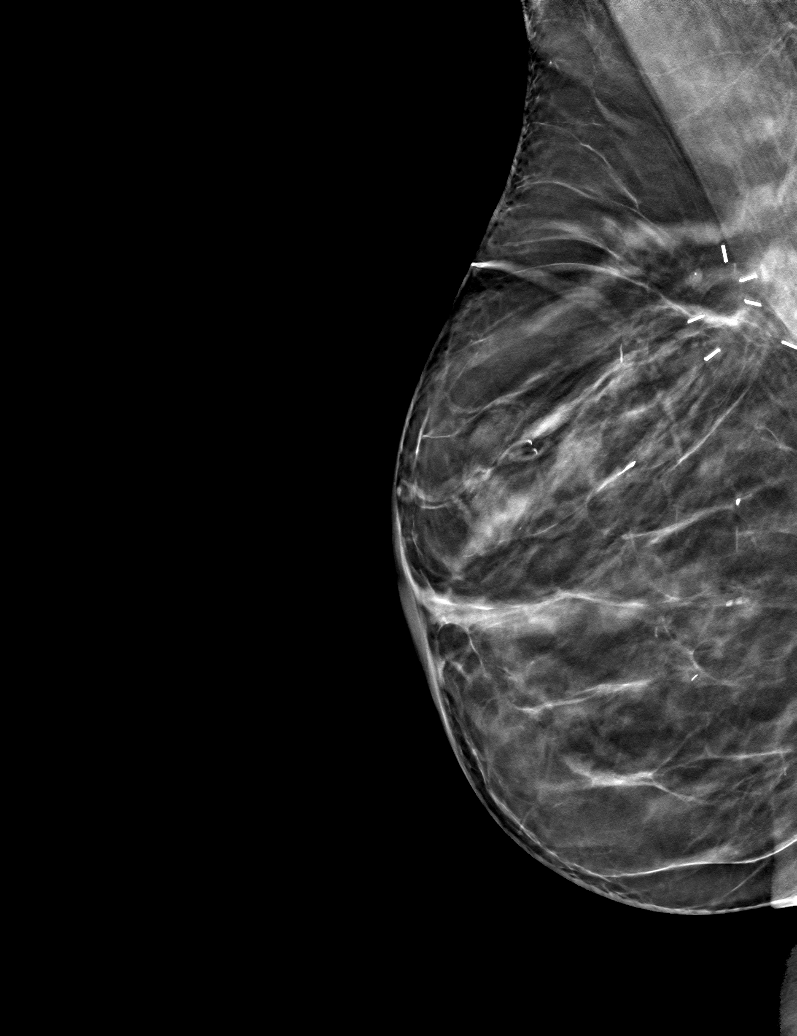

[6 of 30 positions shown; findings below may reference images not displayed]

ACR Breast Density Category c: The breast tissue is heterogeneously
dense, which may obscure small masses.
FINDINGS: There are no findings suspicious for malignancy.
IMPRESSION: No mammographic evidence of malignancy. A result letter of this
screening mammogram will be mailed directly to the patient.

RECOMMENDATION:
Screening mammogram in one year. (Code:Q3-W-BC3)

BI-RADS CATEGORY  1: Negative.

## 2023-05-06 ENCOUNTER — Other Ambulatory Visit: Payer: Self-pay | Admitting: *Deleted

## 2023-05-06 MED ORDER — POTASSIUM CHLORIDE ER 20 MEQ PO TBCR: 20.0000 meq | EXTENDED_RELEASE_TABLET | Freq: Two times a day (BID) | ORAL | 3 refills | Status: DC

## 2023-05-11 ENCOUNTER — Other Ambulatory Visit: Payer: Self-pay | Admitting: Cardiovascular Disease

## 2023-05-14 NOTE — Telephone Encounter (Signed)
Rx request sent to pharmacy.  

## 2023-05-23 ENCOUNTER — Other Ambulatory Visit: Payer: Self-pay | Admitting: Cardiology

## 2023-05-23 DIAGNOSIS — Z23 Encounter for immunization: Secondary | ICD-10-CM | POA: Diagnosis not present

## 2023-06-10 DIAGNOSIS — M545 Low back pain, unspecified: Secondary | ICD-10-CM | POA: Diagnosis not present

## 2023-06-10 DIAGNOSIS — M40209 Unspecified kyphosis, site unspecified: Secondary | ICD-10-CM | POA: Diagnosis not present

## 2023-07-03 DIAGNOSIS — M8580 Other specified disorders of bone density and structure, unspecified site: Secondary | ICD-10-CM | POA: Diagnosis not present

## 2023-07-03 DIAGNOSIS — M4804 Spinal stenosis, thoracic region: Secondary | ICD-10-CM | POA: Diagnosis not present

## 2023-07-03 DIAGNOSIS — M546 Pain in thoracic spine: Secondary | ICD-10-CM | POA: Diagnosis not present

## 2023-07-03 DIAGNOSIS — M47814 Spondylosis without myelopathy or radiculopathy, thoracic region: Secondary | ICD-10-CM | POA: Diagnosis not present

## 2023-07-08 DIAGNOSIS — Z124 Encounter for screening for malignant neoplasm of cervix: Secondary | ICD-10-CM | POA: Diagnosis not present

## 2023-07-08 DIAGNOSIS — Z8619 Personal history of other infectious and parasitic diseases: Secondary | ICD-10-CM | POA: Diagnosis not present

## 2023-07-08 DIAGNOSIS — Z9189 Other specified personal risk factors, not elsewhere classified: Secondary | ICD-10-CM | POA: Diagnosis not present

## 2023-07-08 DIAGNOSIS — R140 Abdominal distension (gaseous): Secondary | ICD-10-CM | POA: Diagnosis not present

## 2023-07-08 DIAGNOSIS — Z8041 Family history of malignant neoplasm of ovary: Secondary | ICD-10-CM | POA: Diagnosis not present

## 2023-07-08 DIAGNOSIS — Z853 Personal history of malignant neoplasm of breast: Secondary | ICD-10-CM | POA: Diagnosis not present

## 2023-07-16 DIAGNOSIS — R140 Abdominal distension (gaseous): Secondary | ICD-10-CM | POA: Diagnosis not present

## 2023-07-19 DIAGNOSIS — M545 Low back pain, unspecified: Secondary | ICD-10-CM | POA: Diagnosis not present

## 2023-07-19 DIAGNOSIS — M546 Pain in thoracic spine: Secondary | ICD-10-CM | POA: Diagnosis not present

## 2023-07-22 DIAGNOSIS — R194 Change in bowel habit: Secondary | ICD-10-CM | POA: Diagnosis not present

## 2023-07-23 DIAGNOSIS — M546 Pain in thoracic spine: Secondary | ICD-10-CM | POA: Diagnosis not present

## 2023-07-23 DIAGNOSIS — M545 Low back pain, unspecified: Secondary | ICD-10-CM | POA: Diagnosis not present

## 2023-07-30 DIAGNOSIS — M546 Pain in thoracic spine: Secondary | ICD-10-CM | POA: Diagnosis not present

## 2023-07-30 DIAGNOSIS — M545 Low back pain, unspecified: Secondary | ICD-10-CM | POA: Diagnosis not present

## 2023-08-06 DIAGNOSIS — Z85828 Personal history of other malignant neoplasm of skin: Secondary | ICD-10-CM | POA: Diagnosis not present

## 2023-08-06 DIAGNOSIS — D2261 Melanocytic nevi of right upper limb, including shoulder: Secondary | ICD-10-CM | POA: Diagnosis not present

## 2023-08-06 DIAGNOSIS — D225 Melanocytic nevi of trunk: Secondary | ICD-10-CM | POA: Diagnosis not present

## 2023-08-06 DIAGNOSIS — L821 Other seborrheic keratosis: Secondary | ICD-10-CM | POA: Diagnosis not present

## 2023-08-06 DIAGNOSIS — D2262 Melanocytic nevi of left upper limb, including shoulder: Secondary | ICD-10-CM | POA: Diagnosis not present

## 2023-08-06 DIAGNOSIS — D2271 Melanocytic nevi of right lower limb, including hip: Secondary | ICD-10-CM | POA: Diagnosis not present

## 2023-08-06 DIAGNOSIS — L309 Dermatitis, unspecified: Secondary | ICD-10-CM | POA: Diagnosis not present

## 2023-08-06 DIAGNOSIS — L905 Scar conditions and fibrosis of skin: Secondary | ICD-10-CM | POA: Diagnosis not present

## 2023-08-13 DIAGNOSIS — M546 Pain in thoracic spine: Secondary | ICD-10-CM | POA: Diagnosis not present

## 2023-08-13 DIAGNOSIS — M545 Low back pain, unspecified: Secondary | ICD-10-CM | POA: Diagnosis not present

## 2023-09-30 ENCOUNTER — Other Ambulatory Visit (HOSPITAL_BASED_OUTPATIENT_CLINIC_OR_DEPARTMENT_OTHER): Payer: Self-pay | Admitting: Family

## 2023-09-30 NOTE — Telephone Encounter (Signed)
*  STAT* If patient is at the pharmacy, call can be transferred to refill team.   1. Which medications need to be refilled? (please list name of each medication and dose if known) Eliquis   2. Would you like to learn more about the convenience, safety, & potential cost savings by using the [facility]?     3. Are you open to using the [facility] (Type [facility].    4. Which pharmacy/location (including street and city if local pharmacy) is medication to be sent to?  [facility] [address], [place]   5. Do they need a 30 day or 90 day supply? 90 days and refills

## 2023-09-30 NOTE — Telephone Encounter (Signed)
Prescription refill request for Eliquis received. Indication: PAF Last office visit: 01/22/23  Jeanie Cooks MD Scr: 0.70 on 09/11/22 Age: 83 Weight: 59.1kg  Based on above findings Eliquis 2.5mg  twice daily is the appropriate dose.  Pt is past due for MD appt and lab work.  Message sent to schedulers.  Refill approved x 1 only.

## 2023-10-15 DIAGNOSIS — M5136 Other intervertebral disc degeneration, lumbar region with discogenic back pain only: Secondary | ICD-10-CM | POA: Diagnosis not present

## 2023-11-06 ENCOUNTER — Other Ambulatory Visit: Payer: Self-pay | Admitting: Cardiovascular Disease

## 2023-11-21 ENCOUNTER — Other Ambulatory Visit: Payer: Self-pay | Admitting: Cardiovascular Disease

## 2023-12-13 ENCOUNTER — Telehealth: Payer: Self-pay | Admitting: Cardiology

## 2023-12-13 NOTE — Telephone Encounter (Signed)
 Patient calling the office for samples of medication:   1.  What medication and dosage are you requesting samples for?apixaban (ELIQUIS) 2.5 MG TABS tablet   2.  Are you currently out of this medication? Have 2 weeks left

## 2023-12-13 NOTE — Telephone Encounter (Signed)
 Okay to give 2 weeks of Eliquis 2.5 mg.  She will need to discuss cost concerns at her follow up in May

## 2023-12-16 MED ORDER — APIXABAN 2.5 MG PO TABS: 2.5000 mg | ORAL_TABLET | Freq: Two times a day (BID) | ORAL | Status: DC

## 2023-12-16 NOTE — Telephone Encounter (Signed)
 Called pt to inform her that we were leaving 2 weeks of samples of Eliquis 2.5 mg tablets at the front desk at [facility] for pt to pick up. I advised the pt that if she has any other problems, questions or concerns, to give our office a call back. Pt verbalized understanding.

## 2023-12-18 DIAGNOSIS — M5136 Other intervertebral disc degeneration, lumbar region with discogenic back pain only: Secondary | ICD-10-CM | POA: Diagnosis not present

## 2023-12-23 DIAGNOSIS — Z0000 Encounter for general adult medical examination without abnormal findings: Secondary | ICD-10-CM | POA: Diagnosis not present

## 2023-12-23 DIAGNOSIS — I272 Pulmonary hypertension, unspecified: Secondary | ICD-10-CM | POA: Diagnosis not present

## 2023-12-23 DIAGNOSIS — D6869 Other thrombophilia: Secondary | ICD-10-CM | POA: Diagnosis not present

## 2023-12-23 DIAGNOSIS — F3341 Major depressive disorder, recurrent, in partial remission: Secondary | ICD-10-CM | POA: Diagnosis not present

## 2023-12-23 DIAGNOSIS — I480 Paroxysmal atrial fibrillation: Secondary | ICD-10-CM | POA: Diagnosis not present

## 2023-12-23 DIAGNOSIS — Z79899 Other long term (current) drug therapy: Secondary | ICD-10-CM | POA: Diagnosis not present

## 2023-12-23 DIAGNOSIS — R7301 Impaired fasting glucose: Secondary | ICD-10-CM | POA: Diagnosis not present

## 2023-12-23 DIAGNOSIS — I10 Essential (primary) hypertension: Secondary | ICD-10-CM | POA: Diagnosis not present

## 2023-12-23 DIAGNOSIS — E559 Vitamin D deficiency, unspecified: Secondary | ICD-10-CM | POA: Diagnosis not present

## 2023-12-23 DIAGNOSIS — I781 Nevus, non-neoplastic: Secondary | ICD-10-CM | POA: Diagnosis not present

## 2023-12-23 DIAGNOSIS — L821 Other seborrheic keratosis: Secondary | ICD-10-CM | POA: Diagnosis not present

## 2023-12-23 DIAGNOSIS — Z853 Personal history of malignant neoplasm of breast: Secondary | ICD-10-CM | POA: Diagnosis not present

## 2023-12-23 LAB — LAB REPORT - SCANNED
A1c: 5.7
EGFR: 88

## 2023-12-24 DIAGNOSIS — I10 Essential (primary) hypertension: Secondary | ICD-10-CM | POA: Diagnosis not present

## 2023-12-24 DIAGNOSIS — I480 Paroxysmal atrial fibrillation: Secondary | ICD-10-CM | POA: Diagnosis not present

## 2023-12-26 ENCOUNTER — Other Ambulatory Visit: Payer: Self-pay | Admitting: Family Medicine

## 2023-12-26 DIAGNOSIS — Z1231 Encounter for screening mammogram for malignant neoplasm of breast: Secondary | ICD-10-CM

## 2024-01-01 ENCOUNTER — Ambulatory Visit: Attending: Student | Admitting: Student

## 2024-01-01 ENCOUNTER — Encounter: Payer: Self-pay | Admitting: Student

## 2024-01-01 ENCOUNTER — Ambulatory Visit
Admission: RE | Admit: 2024-01-01 | Discharge: 2024-01-01 | Disposition: A | Discharge status: Home / Self Care | Source: Ambulatory Visit | Attending: Family Medicine | Admitting: Family Medicine

## 2024-01-01 VITALS — BP 132/68 | HR 47 | Ht 61.0 in | Wt 131.2 lb

## 2024-01-01 DIAGNOSIS — I10 Essential (primary) hypertension: Secondary | ICD-10-CM | POA: Insufficient documentation

## 2024-01-01 DIAGNOSIS — I480 Paroxysmal atrial fibrillation: Secondary | ICD-10-CM | POA: Diagnosis not present

## 2024-01-01 DIAGNOSIS — Z1231 Encounter for screening mammogram for malignant neoplasm of breast: Secondary | ICD-10-CM

## 2024-01-01 HISTORY — DX: Personal history of irradiation: Z92.3

## 2024-01-01 NOTE — Progress Notes (Signed)
  Electrophysiology Office Note:   Date:  01/01/2024  ID:  Aubriana Ravelo, DOB [birth year], MRN [id number]  Primary Cardiologist: Maudine Sos, MD Electrophysiologist: Boyce Byes, MD      History of Present Illness:   Ashlee Flores is a 83 y.o. female with h/o paroxysmal AF and HTN seen today for routine electrophysiology followup.   Since last being seen in our clinic the patient reports doing well. She had an episode of AF vs SVT a few weeks ago in the 150s, but otherwise has been stable. Resting HRs 40-50s and toprol  cut down to 12.5 mg daily. HR varies between 47-102 by apple watch. Currently, she denies chest pain, palpitations, PND, orthopnea, nausea, vomiting, dizziness, syncope, edema, weight gain, or early satiety.  Has mild dyspnea with stairs or inclines.   Review of systems complete and found to be negative unless listed in HPI.   EP Information / Studies Reviewed:    EKG is ordered today. Personal review as below.  EKG Interpretation Date/Time:  Wednesday January 01 2024 09:07:28 EDT Ventricular Rate:  47 PR Interval:  158 QRS Duration:  100 QT Interval:  444 QTC Calculation: 392 R Axis:   25  Text Interpretation: Sinus bradycardia When compared with ECG of 07-Jun-2020 14:02, No significant change was found Confirmed by Pilar Bridge [phone]) on 01/01/2024 9:14:22 AM    Arrhythmia/Device History No specialty comments available.   Physical Exam:   VS:  BP 132/68   Pulse (!) 47   Ht 5\' 1"  (1.549 m)   Wt 131 lb 3.2 oz (59.5 kg)   SpO2 97%   BMI 24.79 kg/m    Wt Readings from Last 3 Encounters:  01/01/24 131 lb 3.2 oz (59.5 kg)  01/22/23 130 lb 3.2 oz (59.1 kg)  09/07/22 128 lb 1.6 oz (58.1 kg)     GEN: No acute distress NECK: No JVD; No carotid bruits CARDIAC: Regular rate and rhythm, no murmurs, rubs, gallops RESPIRATORY:  Clear to auscultation without rales, wheezing or rhonchi  ABDOMEN: Soft, non-tender, non-distended EXTREMITIES:  No edema; No  deformity   ASSESSMENT AND PLAN:    Paroxysmal AF EKG today shows sinus bradycardia with stable intervals Continue flecainide  50 mg BID Continue toprol  12.5 mg daily Diltiazem  prn Continue Eliquis  2.5 mg BID for CHA2DS2/VASc of at least 4.   Sinus bradycardia Asymptomatic, but if she starts to have more AF will need to consider alternate agent.  Discussed Tikosyn and Amiodarone as potentially alternatives today, as well as the possibility of eventual pacing need.   HTN Stable on current regimen    Follow up with EP APP in 6 months  Signed, Tylene Galla, PA-C

## 2024-01-01 NOTE — Patient Instructions (Signed)
 Medication Instructions:  Your physician recommends that you continue on your current medications as directed. Please refer to the Current Medication list given to you today.  *If you need a refill on your cardiac medications before your next appointment, please call your pharmacy*  Lab Work: None ordered If you have labs (blood work) drawn today and your tests are completely normal, you will receive your results only by: MyChart Message (if you have MyChart) OR A paper copy in the mail If you have any lab test that is abnormal or we need to change your treatment, we will call you to review the results.  Follow-Up: At [facility], you and your health needs are our priority.  As part of our continuing mission to provide you with exceptional heart care, our providers are all part of one team.  This team includes your primary Cardiologist (physician) and Advanced Practice Providers or APPs (Physician Assistants and Nurse Practitioners) who all work together to provide you with the care you need, when you need it.  Your next appointment:   6 month(s)  Provider:   You will see one of the following Advanced Practice Providers on your designated Care Team:   Mertha Abrahams, [place] Bambi Lever "Jonelle Neri" Tillery, PA-C Creighton Doffing, NP     1st Floor: - Lobby - Registration  - Pharmacy  - Lab - Cafe  2nd Floor: - PV Lab - Diagnostic Testing (echo, CT, nuclear med)  3rd Floor: - Vacant  4th Floor: - TCTS (cardiothoracic surgery) - AFib Clinic - Structural Heart Clinic - Vascular Surgery  - Vascular Ultrasound  5th Floor: - HeartCare Cardiology (general and EP) - Clinical Pharmacy for coumadin, hypertension, lipid, weight-loss medications, and med management appointments    Valet parking services will be available as well.

## 2024-01-03 ENCOUNTER — Telehealth: Payer: Self-pay | Admitting: Cardiology

## 2024-01-03 NOTE — Telephone Encounter (Signed)
*  STAT* If patient is at the pharmacy, call can be transferred to refill team.   1. Which medications need to be refilled? (please list name of each medication and dose if known)  apixaban  (ELIQUIS ) 2.5 MG TABS tablet  2. Which pharmacy/location (including street and city if local pharmacy) is medication to be sent to? [id number] - [place], [place] - [address] AT [facility] [address] Phone: [phone]  Fax: [phone]     3. Do they need a 30 day or 90 day supply? 90  Pt is out of medication

## 2024-01-05 ENCOUNTER — Other Ambulatory Visit (HOSPITAL_BASED_OUTPATIENT_CLINIC_OR_DEPARTMENT_OTHER): Payer: Self-pay | Admitting: Cardiology

## 2024-01-06 ENCOUNTER — Other Ambulatory Visit: Payer: Self-pay

## 2024-01-06 MED ORDER — APIXABAN 2.5 MG PO TABS: 2.5000 mg | ORAL_TABLET | Freq: Two times a day (BID) | ORAL | 1 refills | Status: DC

## 2024-01-06 NOTE — Telephone Encounter (Signed)
 Prescription refill request for Eliquis  received. Indication:afib Last office visit:4/25 Scr:0.64  4/25 Age: 83 Weight:59.5  kg  Prescription refilled

## 2024-01-08 DIAGNOSIS — I480 Paroxysmal atrial fibrillation: Secondary | ICD-10-CM | POA: Diagnosis not present

## 2024-01-08 DIAGNOSIS — F3340 Major depressive disorder, recurrent, in remission, unspecified: Secondary | ICD-10-CM | POA: Diagnosis not present

## 2024-01-08 DIAGNOSIS — I10 Essential (primary) hypertension: Secondary | ICD-10-CM | POA: Diagnosis not present

## 2024-01-16 ENCOUNTER — Ambulatory Visit: Admitting: Student

## 2024-01-22 DIAGNOSIS — I10 Essential (primary) hypertension: Secondary | ICD-10-CM | POA: Diagnosis not present

## 2024-01-22 DIAGNOSIS — I480 Paroxysmal atrial fibrillation: Secondary | ICD-10-CM | POA: Diagnosis not present

## 2024-01-23 ENCOUNTER — Other Ambulatory Visit: Payer: Self-pay | Admitting: Cardiology

## 2024-02-06 DIAGNOSIS — R001 Bradycardia, unspecified: Secondary | ICD-10-CM | POA: Diagnosis not present

## 2024-02-06 DIAGNOSIS — I480 Paroxysmal atrial fibrillation: Secondary | ICD-10-CM | POA: Diagnosis not present

## 2024-02-06 DIAGNOSIS — I10 Essential (primary) hypertension: Secondary | ICD-10-CM | POA: Diagnosis not present

## 2024-02-08 DIAGNOSIS — I10 Essential (primary) hypertension: Secondary | ICD-10-CM | POA: Diagnosis not present

## 2024-02-08 DIAGNOSIS — I480 Paroxysmal atrial fibrillation: Secondary | ICD-10-CM | POA: Diagnosis not present

## 2024-02-08 DIAGNOSIS — F3340 Major depressive disorder, recurrent, in remission, unspecified: Secondary | ICD-10-CM | POA: Diagnosis not present

## 2024-02-21 ENCOUNTER — Other Ambulatory Visit: Payer: Self-pay | Admitting: Cardiovascular Disease

## 2024-02-21 DIAGNOSIS — I10 Essential (primary) hypertension: Secondary | ICD-10-CM | POA: Diagnosis not present

## 2024-02-21 DIAGNOSIS — I480 Paroxysmal atrial fibrillation: Secondary | ICD-10-CM | POA: Diagnosis not present

## 2024-02-25 ENCOUNTER — Telehealth: Payer: Self-pay | Admitting: Cardiology

## 2024-02-25 NOTE — Telephone Encounter (Signed)
 Patient c/o Palpitations:  STAT if patient reporting lightheadedness, shortness of breath, or chest pain  How long have you had palpitations/irregular HR/ Afib? Patient states she has been in A-fib since 6pm last night. Are you having the symptoms now? no  Are you currently experiencing lightheadedness, SOB or CP? no  Do you have a history of afib (atrial fibrillation) or irregular heart rhythm? Yes, has history of A-fib   Have you checked your BP or HR? (document readings if available): 138/91 HR 124 (she checked it while we were on the phone) last night it went to 135  Are you experiencing any other symptoms? States she is having no other symptoms.

## 2024-02-25 NOTE — Telephone Encounter (Signed)
 Patient states she went into a-fib last night around 6PM. She states her HR last night went up to 135. This morning when she called in her BP was 138/91, HR 124. She denies any SOB, CP, dizziness, palpitations, fatigue.  She is taking her Eliquis , flecainide  50 mg BID, and Toprol  12.5 mg at bedtime as recommended by Dr. Marven Slimmer. She also took PRN diltiazem  30 mg last night before bed, then took another half tablet (15 mg) at 4 AM.    Patient may take diltiazem  1/2-1 tablet every 4 hours needed for elevated HR >100 as long as SBP is >100. Instructed patient to take another dose of diltiazem  and we will send message to Dr. Marven Slimmer to review and advise on next steps.  Advised on ED precautions. Patient states she will go to ED if HR remains >120 after another dose of diltiazem .

## 2024-03-02 ENCOUNTER — Ambulatory Visit (HOSPITAL_COMMUNITY)
Admission: RE | Admit: 2024-03-02 | Discharge: 2024-03-02 | Disposition: A | Discharge status: Home / Self Care | Source: Ambulatory Visit | Attending: Internal Medicine | Admitting: Internal Medicine

## 2024-03-02 VITALS — BP 160/70 | HR 47 | Ht 61.0 in | Wt 128.8 lb

## 2024-03-02 DIAGNOSIS — D6869 Other thrombophilia: Secondary | ICD-10-CM | POA: Diagnosis not present

## 2024-03-02 DIAGNOSIS — Z7901 Long term (current) use of anticoagulants: Secondary | ICD-10-CM | POA: Diagnosis not present

## 2024-03-02 DIAGNOSIS — Z79899 Other long term (current) drug therapy: Secondary | ICD-10-CM | POA: Insufficient documentation

## 2024-03-02 DIAGNOSIS — Z5181 Encounter for therapeutic drug level monitoring: Secondary | ICD-10-CM | POA: Insufficient documentation

## 2024-03-02 DIAGNOSIS — I4891 Unspecified atrial fibrillation: Secondary | ICD-10-CM | POA: Diagnosis not present

## 2024-03-02 DIAGNOSIS — R001 Bradycardia, unspecified: Secondary | ICD-10-CM | POA: Diagnosis not present

## 2024-03-02 DIAGNOSIS — I480 Paroxysmal atrial fibrillation: Secondary | ICD-10-CM | POA: Diagnosis not present

## 2024-03-02 NOTE — Progress Notes (Signed)
 Primary Care Physician: Verena Mems, MD Referring Physician: Scot Ford, PA Cardiologist: Dr. Raford Das Altemose is a 83 y.o. female with a h/o HTN, paroxysmal afib, bradycardia, syncope 04/01/20 at time of syncope, she had taken prn metoprolol  early that night for afib. She is in the afib clinic as f/u from  last ER visit. She injured her neck/back from  the fall in July and is looking at surgery in the near future.  Pt was placed on flecainide  in July. She  could not tolerate 100 mg bid of flecainide  so this was reduced to 50 mg bid. She is not on BB  with flecainide  for prior bradycardia issues. When she in in afib she can have rates 120-130 bpm. She gets very uncomfortable with the higher heart rates, and very anxious. On Eliquis  2.5 mg BID for a CHA2DS2VASc score of 4.  On evaluation today, she is currently in NSR. Patient contacted clinic on 02/25/24 noting she was in Afib on 6/16. The episode lasted for about 17 hours; it scared her due to elevated HR and did she take diltiazem  PRN. She has not missed any doses of flecainide  50 mg BID or Toprol  12.5 mg or Eliquis  2.5 mg BID.   Today, she denies symptoms of chest pain, shortness of breath, orthopnea, PND, lower extremity edema, dizziness, presyncope, syncope, or neurologic sequela. The patient is tolerating medications without difficulties and is otherwise without complaint today.   Past Medical History:  Diagnosis Date   Arthritis    NECK   Breast cancer (HCC)    DCIS breast cancer at 50   Diastolic dysfunction 10/12/2016   Dysrhythmia    PAF   Essential hypertension 06/04/2016   Family history of breast cancer    Family history of ovarian cancer    Palpitations 06/04/2016   Paroxysmal atrial fibrillation (HCC) 08/22/2016   Personal history of radiation therapy    Past Surgical History:  Procedure Laterality Date   ANTERIOR CERVICAL DECOMP/DISCECTOMY FUSION N/A 07/22/2020   Procedure: Anterior Cervical Discectomy  Fusion Cervical Four-Five, Cervical Five- Six, Cervical Six-Seven;  Surgeon: Louis Shove, MD;  Location: [facility] OR;  Service: Neurosurgery;  Laterality: N/A;  Anterior Cervical Discectomy Fusion Cervical Four-Five, Cervical Five- Six, Cervical Six-Seven   BREAST EXCISIONAL BIOPSY Right    BREAST LUMPECTOMY Right 2001   DCIS   BREAST SURGERY Right    LUMPECTOMY   DILATION AND CURETTAGE OF UTERUS     EYE SURGERY Left    CATARACT   SALPINGOOPHORECTOMY     preventative, due to FH of ovarian cancer    Current Outpatient Medications  Medication Sig Dispense Refill   acetaminophen  (TYLENOL ) 500 MG tablet Take 500-1,000 mg by mouth every 6 (six) hours as needed for mild pain.  (Patient taking differently: Take 500-1,000 mg by mouth as needed.)     apixaban  (ELIQUIS ) 2.5 MG TABS tablet Take 1 tablet (2.5 mg total) by mouth 2 (two) times daily. 180 tablet 1   Calcium  Carb-Cholecalciferol  (CALCIUM  600/VITAMIN D3 PO) Take 1 tablet by mouth daily.     Cholecalciferol  50 MCG (2000 UT) TABS 1 tablet Orally Once a day for 30 day(s)     diltiazem  (CARDIZEM ) 30 MG tablet CARDIZEM  30MG  -- TAKE 1/2 TO 1 TABLET EVERY 4 HOURS AS NEEDED FOR AFIB HEART RATE >100 AS LONG AS TOP BLOOD PRESSURE >100. 45 tablet 1   flecainide  (TAMBOCOR ) 50 MG tablet TAKE 1 TABLET(50 MG) BY MOUTH TWICE DAILY. AVOID ANY MISSED  DOSES 60 tablet 1   hydrochlorothiazide  (HYDRODIURIL ) 25 MG tablet Take 1 tablet (25 mg total) by mouth daily. 90 tablet 1   lisinopril  (ZESTRIL ) 20 MG tablet Take 20 mg by mouth daily. (Patient taking differently: Take 20 mg by mouth daily. Not sure if she is taking 20 mg once daily or 10 mg once daily)     metoprolol  succinate (TOPROL -XL) 25 MG 24 hr tablet Take 1 tablet (25 mg total) by mouth daily. (Patient taking differently: Take 12.5 mg by mouth daily.) 90 tablet 3   Potassium Chloride  ER 20 MEQ TBCR Take 1 tablet (20 mEq total) by mouth in the morning and at bedtime. 180 tablet 3   triamcinolone cream (KENALOG)  0.1 % SMARTSIG:sparingly Topical Twice Daily (Patient taking differently: Apply 1 Application topically as needed.)     Zinc  50 MG TABS Take 1 tablet by mouth every morning.     lisinopril  (PRINIVIL ,ZESTRIL ) 10 MG tablet Take 10 mg by mouth daily.  (Patient not taking: Reported on 03/02/2024)     pantoprazole  (PROTONIX ) 40 MG tablet TAKE 1 TABLET BY MOUTH EVERY DAY 90 tablet 3   No current facility-administered medications for this encounter.    Allergies  Allergen Reactions   Penicillins Anaphylaxis   ROS- All systems are reviewed and negative except as per the HPI above  Physical Exam: Vitals:   03/02/24 0951  BP: (!) 160/70  Pulse: (!) 47  Weight: 58.4 kg  Height: 5' 1 (1.549 m)    Wt Readings from Last 3 Encounters:  03/02/24 58.4 kg  01/01/24 59.5 kg  01/22/23 59.1 kg    Labs: Lab Results  Component Value Date   NA 137 09/11/2022   K 4.2 09/11/2022   CL 97 09/11/2022   CO2 24 09/11/2022   GLUCOSE 110 (H) 09/11/2022   BUN 12 09/11/2022   CREATININE 0.70 09/11/2022   CALCIUM  10.0 09/11/2022   MG 1.9 09/11/2022   No results found for: INR Lab Results  Component Value Date   CHOL 189 08/31/2019   HDL 84 08/31/2019   LDLCALC 92 08/31/2019   TRIG 73 08/31/2019   GEN- The patient is well appearing, alert and oriented x 3 today.   Neck - no JVD or carotid bruit noted Lungs- Clear to ausculation bilaterally, normal work of breathing Heart- Regular bradycardic rate and rhythm, no murmurs, rubs or gallops, PMI not laterally displaced Extremities- no clubbing, cyanosis, or edema Skin - no rash or ecchymosis noted   EKG- Vent. rate 47 BPM PR interval 172 ms QRS duration 88 ms QT/QTcB 436/385 ms P-R-T axes 51 74 78 Sinus bradycardia Septal infarct , age undetermined Abnormal ECG When compared with ECG of 01-Jan-2024 09:07, Septal infarct is now Prese  Echo 03/30/20- 1. Left ventricular ejection fraction, by estimation, is 60 to 65%. The  left ventricle  has normal function. The left ventricle has no regional  wall motion abnormalities. Left ventricular diastolic parameters are  consistent with Grade II diastolic  dysfunction (pseudonormalization).   2. Right ventricular systolic function is normal. The right ventricular  size is normal. There is mildly elevated pulmonary artery systolic  pressure. The estimated right ventricular systolic pressure is 42.9 mmHg.   3. Left atrial size was mildly dilated.   4. The mitral valve is normal in structure. Trivial mitral valve  regurgitation. No evidence of mitral stenosis.   5. Tricuspid valve regurgitation is mild to moderate.   6. The aortic valve is tricuspid. Aortic valve  regurgitation is mild.  Mild aortic valve sclerosis is present, with no evidence of aortic valve  stenosis.   7. The inferior vena cava is dilated in size with <50% respiratory  variability, suggesting right atrial pressure of 15 mmHg.   CHA2DS2-VASc Score = 4  The patient's score is based upon: CHF History: 0 HTN History: 1 Diabetes History: 0 Stroke History: 0 Vascular Disease History: 0 Age Score: 2 Gender Score: 1       ASSESSMENT AND PLAN: Paroxysmal Atrial Fibrillation (ICD10:  I48.0) The patient's CHA2DS2-VASc score is 4, indicating a 4.8% annual risk of stroke.   History of intolerant to higher doses of flecainide  per note review.  She is currently in NSR. We had a long discussion about medication treatments and ablation in detail. We discussed potential options such as  Tikosyn, and amiodarone. We talked about the monitoring required for these medications, hospital admission for Tikosyn, and potential adverse effects. We also discussed pulse field ablation as an option in the form of intervention. After discussion, patient is not interested in making any changes to her current regimen. She feels her burden is low enough overall she doesn't wish to do anything different. She will contact us  if she notes  increasing burden. We did also briefly discuss if starting amiodarone would discontinue Toprol .  High risk medication monitoring (ICD10: J342684) Patient requires ongoing monitoring for anti-arrhythmic medication which has the potential to cause life threatening arrhythmias or AV block. ECG intervals are stable. Continue flecainide  50 mg BID.   Secondary Hypercoagulable State (ICD10:  D68.69) The patient is at significant risk for stroke/thromboembolism based upon her CHA2DS2-VASc Score of 4.  Continue Apixaban  (Eliquis ).  No missed doses.   Dosage of Eliquis  2.5 mg BID is correct based on age and weight < 60 kg.     Follow up Afib clinic prn.   Dorn Heinrich, PA-C Afib Clinic [facility] [address] [place], [place] [address] [phone]

## 2024-03-09 DIAGNOSIS — F3340 Major depressive disorder, recurrent, in remission, unspecified: Secondary | ICD-10-CM | POA: Diagnosis not present

## 2024-03-09 DIAGNOSIS — I480 Paroxysmal atrial fibrillation: Secondary | ICD-10-CM | POA: Diagnosis not present

## 2024-03-09 DIAGNOSIS — I10 Essential (primary) hypertension: Secondary | ICD-10-CM | POA: Diagnosis not present

## 2024-03-16 NOTE — Progress Notes (Signed)
  Cardiology Office Note:  .   Date:  03/23/2024  ID:  [name], DOB 04/28/1941, MRN [id number] PCP: Verena Mems, MD  [facility] Providers Cardiologist:  Annabella Scarce, MD Electrophysiologist:  OLE ONEIDA HOLTS, MD    History of Present Illness: Ashlee Flores is a 83 y.o. female with history of PAF and HTN. She was seen in Afib clinic 03/02/24 after an episode of Afib lasting 17 hrs. . She has not missed any doses of flecainide  50 mg BID or Toprol  12.5 mg or Eliquis  2.5 mg BID. They discussed ablation or other meds but she wanted to hold off on changes unless her symptoms changed.  Patient comes in for f/u. She had more Afib 03/18/24. BP goes up and episodes lasted 6 hrs. She feels terrible like she's going to die. Had frequent urination and a bowel movement. Took 2 diltiazem  and was afraid to take more. HR low at baseline 44-low 50's. Having a lot of dental work and may need implants. Also has some anxiety that may contribute to it. She wants to travel and not be afraid of afib.  ROS:    Studies Reviewed: SABRA         Prior CV Studies:      Risk Assessment/Calculations:    CHA2DS2-VASc Score = 4   This indicates a 4.8% annual risk of stroke. The patient's score is based upon: CHF History: 0 HTN History: 1 Diabetes History: 0 Stroke History: 0 Vascular Disease History: 0 Age Score: 2 Gender Score: 1            Physical Exam:   VS:  BP 132/60   Pulse (!) 50   Ht 5' 1 (1.549 m)   Wt 124 lb 9.6 oz (56.5 kg)   LMP  (LMP Unknown)   SpO2 97%   BMI 23.54 kg/m    Orhtostatics: No data found. Wt Readings from Last 3 Encounters:  03/23/24 124 lb 9.6 oz (56.5 kg)  03/02/24 128 lb 12.8 oz (58.4 kg)  01/01/24 131 lb 3.2 oz (59.5 kg)    GEN: Well nourished, well developed in no acute distress NECK: No JVD; No carotid bruits CARDIAC:  RRR, 1/6 systolic murmur LSB RESPIRATORY:  Clear to auscultation without rales, wheezing or rhonchi  ABDOMEN: Soft,  non-tender, non-distended EXTREMITIES:  No edema; No deformity   ASSESSMENT AND PLAN: .    PAF followed by the Afib clinic and Dr. HOLTS. She's having more frequent episodes lasting up to 6 hrs. Feels terrible. She's on flecainide  and prn diltiazem . Will get her in with Dr. HOLTS to decide next course of treatment. Baseline HR 44-55. Has apple watch with EKG monitoring and very aware of when she's in Afib.  HTN-goes up when in Afib but otherwise normal. No changes.        Dispo: f/u with Dr. HOLTS next available.  Signed, Olivia Pavy, PA-C

## 2024-03-22 DIAGNOSIS — I480 Paroxysmal atrial fibrillation: Secondary | ICD-10-CM | POA: Diagnosis not present

## 2024-03-22 DIAGNOSIS — I10 Essential (primary) hypertension: Secondary | ICD-10-CM | POA: Diagnosis not present

## 2024-03-23 ENCOUNTER — Ambulatory Visit: Attending: Physician Assistant | Admitting: Physician Assistant

## 2024-03-23 ENCOUNTER — Encounter: Payer: Self-pay | Admitting: Physician Assistant

## 2024-03-23 VITALS — BP 132/60 | HR 50 | Ht 61.0 in | Wt 124.6 lb

## 2024-03-23 DIAGNOSIS — I10 Essential (primary) hypertension: Secondary | ICD-10-CM | POA: Diagnosis not present

## 2024-03-23 DIAGNOSIS — I480 Paroxysmal atrial fibrillation: Secondary | ICD-10-CM | POA: Insufficient documentation

## 2024-03-23 NOTE — Patient Instructions (Signed)
 Medication Instructions:  Your physician recommends that you continue on your current medications as directed. Please refer to the Current Medication list given to you today.  *If you need a refill on your cardiac medications before your next appointment, please call your pharmacy*  Lab Work: None ordered  If you have labs (blood work) drawn today and your tests are completely normal, you will receive your results only by: MyChart Message (if you have MyChart) OR A paper copy in the mail If you have any lab test that is abnormal or we need to change your treatment, we will call you to review the results.  Testing/Procedures: None ordered   Follow-Up: At [facility], you and your health needs are our priority.  As part of our continuing mission to provide you with exceptional heart care, our providers are all part of one team.  This team includes your primary Cardiologist (physician) and Advanced Practice Providers or APPs (Physician Assistants and Nurse Practitioners) who all work together to provide you with the care you need, when you need it.  Your next appointment:   1st available   Provider:   You may see OLE ONEIDA HOLTS, MD or one of the following Advanced Practice Providers on your designated Care Team:   Charlies Arthur, PA-C Michael Andy Tillery, PA-C Suzann Riddle, NP Daphne Barrack, NP    We recommend signing up for the patient portal called MyChart.  Sign up information is provided on this After Visit Summary.  MyChart is used to connect with patients for Virtual Visits (Telemedicine).  Patients are able to view lab/test results, encounter notes, upcoming appointments, etc.  Non-urgent messages can be sent to your provider as well.   To learn more about what you can do with MyChart, go to [email or web address].   Other Instructions

## 2024-04-07 ENCOUNTER — Telehealth: Payer: Self-pay | Admitting: *Deleted

## 2024-04-07 NOTE — Telephone Encounter (Signed)
 Call placed to pt regarding blood pressure readings.  She mentioned it does go up when in A-fib, that it was different.  Pt has been advised to continue to monitor her blood pressure.  Bring the log and her medications with her to her appointment with Dr. Cindie 04/15/24.

## 2024-04-07 NOTE — Telephone Encounter (Signed)
-----   Message from Olivia Pavy sent at 04/07/2024 12:17 PM EDT ----- BP continues to fluctuate. Are they still going up when she is in Afib? If so she already has an appt with Dr. Cindie 04/16/24 and can discuss with him. Please bring BP readings and meds with her to appt. thanks ----- Message ----- From: Memory Nest, RMA Sent: 04/07/2024  11:21 AM EDT To: Olivia CHRISTELLA Pavy, PA-C

## 2024-04-09 DIAGNOSIS — F3340 Major depressive disorder, recurrent, in remission, unspecified: Secondary | ICD-10-CM | POA: Diagnosis not present

## 2024-04-09 DIAGNOSIS — I10 Essential (primary) hypertension: Secondary | ICD-10-CM | POA: Diagnosis not present

## 2024-04-09 DIAGNOSIS — I480 Paroxysmal atrial fibrillation: Secondary | ICD-10-CM | POA: Diagnosis not present

## 2024-04-16 ENCOUNTER — Ambulatory Visit: Attending: Cardiology | Admitting: Cardiology

## 2024-04-16 ENCOUNTER — Encounter: Payer: Self-pay | Admitting: Cardiology

## 2024-04-16 VITALS — BP 144/70 | HR 60 | Ht 61.0 in | Wt 130.6 lb

## 2024-04-16 DIAGNOSIS — I10 Essential (primary) hypertension: Secondary | ICD-10-CM | POA: Insufficient documentation

## 2024-04-16 DIAGNOSIS — I480 Paroxysmal atrial fibrillation: Secondary | ICD-10-CM | POA: Insufficient documentation

## 2024-04-16 DIAGNOSIS — Z79899 Other long term (current) drug therapy: Secondary | ICD-10-CM | POA: Diagnosis not present

## 2024-04-16 MED ORDER — METOPROLOL SUCCINATE ER 25 MG PO TB24: 12.5000 mg | ORAL_TABLET | Freq: Every day | ORAL | 3 refills | Status: AC

## 2024-04-16 MED ORDER — LISINOPRIL 40 MG PO TABS: 40.0000 mg | ORAL_TABLET | Freq: Every day | ORAL | 3 refills | Status: AC

## 2024-04-16 NOTE — Progress Notes (Signed)
  Electrophysiology Office Follow up Visit Note:    Date:  04/16/2024   ID:  Ashlee Flores, DOB 20-Oct-1940, MRN [id number]  PCP:  Verena Mems, MD  [facility] Cardiologist:  Annabella Scarce, MD  [facility] Electrophysiologist:  OLE ONEIDA HOLTS, MD    Interval History:     Ashlee Flores is a 83 y.o. female who presents for a follow up visit.   The patient last saw Norleen in the A-fib clinic on March 02, 2024.  She has been on flecainide  in the past.  At the appointment with Norleen she described ongoing issues with atrial fibrillation.  They discussed alternative antiarrhythmic drugs and catheter ablation.  She is doing well.  She is with her husband.  She reports rare episodes of atrial fibrillation.  These cause anxiety.  She takes as needed diltiazem  which helps her symptoms.  She is interested in going back to the gym.      Past medical, surgical, social and family history were reviewed.  ROS:   Please see the history of present illness.    All other systems reviewed and are negative.  EKGs/Labs/Other Studies Reviewed:    The following studies were reviewed today:  February 18, 2022 ZIO monitor personally reviewed Less than 1% burden of atrial fibrillation, longest episode 2 hours and 16 minutes   EKG Interpretation Date/Time:  Thursday April 16 2024 14:11:08 EDT Ventricular Rate:  60 PR Interval:  136 QRS Duration:  116 QT Interval:  436 QTC Calculation: 436 R Axis:   68  Text Interpretation: Normal sinus rhythm Normal ECG Confirmed by HOLTS OLE [phone]) on 04/16/2024 2:12:44 PM    Physical Exam:    VS:  BP (!) 144/70 (BP Location: Right Arm, Patient Position: Sitting, Cuff Size: Normal)   Pulse 60   Ht 5' 1 (1.549 m)   Wt 130 lb 9.6 oz (59.2 kg)   LMP  (LMP Unknown)   SpO2 98%   BMI 24.68 kg/m     Wt Readings from Last 3 Encounters:  04/16/24 130 lb 9.6 oz (59.2 kg)  03/23/24 124 lb 9.6 oz (56.5 kg)  03/02/24 128 lb 12.8 oz (58.4 kg)      GEN: no distress CARD: RRR, No MRG RESP: No IWOB. CTAB.      ASSESSMENT:    1. Paroxysmal atrial fibrillation (HCC)   2. Primary hypertension   3. Encounter for long-term (current) use of high-risk medication    PLAN:    In order of problems listed above:  #Paroxysmal atrial fibrillation #High risk drug monitoring-flecainide  Overall good control of her arrhythmia using flecainide  PR and QRS durations acceptable for ongoing flecainide  use Continue flecainide  and metoprolol  Continue Eliquis  for stroke prophylaxis  #Hypertension Above goal today.  Recommend checking blood pressures 1-2 times per week at home and recording the values.  Recommend bringing these recordings to the primary care physician. Increase lisinopril  to 40mg  daily PCP follow up  Follow up 6 mo with APP   Signed, OLE HOLTS, MD, [facility], [facility] 04/16/2024 2:30 PM    Electrophysiology [facility]

## 2024-04-16 NOTE — Patient Instructions (Addendum)
 Medication Instructions:  Your physician has recommended you make the following change in your medication:  1) INCREASE lisinopril  to 40 mg once daily   *If you need a refill on your cardiac medications before your next appointment, please call your pharmacy*  Follow-Up: At [facility], you and your health needs are our priority.  As part of our continuing mission to provide you with exceptional heart care, our providers are all part of one team.  This team includes your primary Cardiologist (physician) and Advanced Practice Providers or APPs (Physician Assistants and Nurse Practitioners) who all work together to provide you with the care you need, when you need it.  Your next appointment:   6 month  Provider:   You will see one of the following Advanced Practice Providers on your designated Care Team:   Charlies Arthur, PA-C Michael Andy Tillery, PA-C Suzann Riddle, NP Daphne Barrack, NP

## 2024-04-18 ENCOUNTER — Other Ambulatory Visit: Payer: Self-pay | Admitting: Cardiology

## 2024-04-21 DIAGNOSIS — I480 Paroxysmal atrial fibrillation: Secondary | ICD-10-CM | POA: Diagnosis not present

## 2024-04-21 DIAGNOSIS — I10 Essential (primary) hypertension: Secondary | ICD-10-CM | POA: Diagnosis not present

## 2024-04-23 ENCOUNTER — Telehealth: Payer: Self-pay | Admitting: Cardiovascular Disease

## 2024-04-23 MED ORDER — DILTIAZEM HCL 30 MG PO TABS: ORAL_TABLET | ORAL | 1 refills | Status: AC

## 2024-04-23 NOTE — Telephone Encounter (Signed)
*  STAT* If patient is at the pharmacy, call can be transferred to refill team.   1. Which medications need to be refilled? (please list name of each medication and dose if known) diltiazem  (CARDIZEM ) 30 MG tablet   2. Which pharmacy/location (including street and city if local pharmacy) is medication to be sent to?  [id number] - [place], [place] - [address]    3. Do they need a 30 day or 90 day supply? 90

## 2024-04-23 NOTE — Telephone Encounter (Signed)
 Refill sent.

## 2024-05-10 DIAGNOSIS — I480 Paroxysmal atrial fibrillation: Secondary | ICD-10-CM | POA: Diagnosis not present

## 2024-05-10 DIAGNOSIS — I10 Essential (primary) hypertension: Secondary | ICD-10-CM | POA: Diagnosis not present

## 2024-05-10 DIAGNOSIS — F3340 Major depressive disorder, recurrent, in remission, unspecified: Secondary | ICD-10-CM | POA: Diagnosis not present

## 2024-05-21 DIAGNOSIS — I10 Essential (primary) hypertension: Secondary | ICD-10-CM | POA: Diagnosis not present

## 2024-05-21 DIAGNOSIS — I480 Paroxysmal atrial fibrillation: Secondary | ICD-10-CM | POA: Diagnosis not present

## 2024-06-09 DIAGNOSIS — I480 Paroxysmal atrial fibrillation: Secondary | ICD-10-CM | POA: Diagnosis not present

## 2024-06-09 DIAGNOSIS — F3340 Major depressive disorder, recurrent, in remission, unspecified: Secondary | ICD-10-CM | POA: Diagnosis not present

## 2024-06-09 DIAGNOSIS — I10 Essential (primary) hypertension: Secondary | ICD-10-CM | POA: Diagnosis not present

## 2024-06-10 DIAGNOSIS — I10 Essential (primary) hypertension: Secondary | ICD-10-CM | POA: Diagnosis not present

## 2024-06-10 DIAGNOSIS — I480 Paroxysmal atrial fibrillation: Secondary | ICD-10-CM | POA: Diagnosis not present

## 2024-06-17 ENCOUNTER — Telehealth: Payer: Self-pay | Admitting: Cardiovascular Disease

## 2024-06-17 NOTE — Telephone Encounter (Signed)
 S/w the DDS office, confirmed procedure: 2 teeth surgical extraction. Implant is possibly planned for same visit.

## 2024-06-17 NOTE — Telephone Encounter (Signed)
 Please ask dental office about approximate number of teeth to be extracted as this makes a difference concerning pharmacy recommendations or low risk clearance to continue medications.

## 2024-06-17 NOTE — Telephone Encounter (Signed)
   Patient Name: Ashlee Flores  DOB: Mar 25, 1941 MRN: [id number]  Primary Cardiologist: Annabella Scarce, MD  Chart reviewed as part of pre-operative protocol coverage.   IF SIMPLE EXTRACTION/CLEANINGS: Simple dental extractions (i.e. 1-2 teeth) are considered low risk procedures per guidelines and generally do not require any specific cardiac clearance. It is also generally accepted that for simple extractions and dental cleanings, there is no need to interrupt blood thinner therapy.  SBE prophylaxis is not required for the patient from a cardiac standpoint.  I will route this recommendation to the requesting party via Epic fax function and remove from pre-op pool.  Please call with questions.  Lamarr Satterfield, NP 06/17/2024, 3:33 PM

## 2024-06-17 NOTE — Telephone Encounter (Signed)
   Pre-operative Risk Assessment    Patient Name: Ashlee Flores  DOB: 1941/08/20 MRN: [id number]   Date of last office visit: 04/16/24 Date of next office visit: TBD  Request for Surgical Clearance    Procedure:  Dental Extraction - Amount of Teeth to be Pulled:  TBD   Date of Surgery:  Clearance 06/22/24                                Surgeon:  Lovett Gamble Surgeon's Group or Practice Name:  [facility]  Phone number:  [phone] Fax number:  [phone]   Type of Clearance Requested:   - Medical  - Pharmacy:  Hold Apixaban  (Eliquis ) TBD   Type of Anesthesia:  Local    Additional requests/questions:    [name]   06/17/2024, 2:32 PM

## 2024-06-20 DIAGNOSIS — I10 Essential (primary) hypertension: Secondary | ICD-10-CM | POA: Diagnosis not present

## 2024-06-20 DIAGNOSIS — I480 Paroxysmal atrial fibrillation: Secondary | ICD-10-CM | POA: Diagnosis not present

## 2024-06-25 ENCOUNTER — Other Ambulatory Visit: Payer: Self-pay | Admitting: Cardiovascular Disease

## 2024-07-10 DIAGNOSIS — F3340 Major depressive disorder, recurrent, in remission, unspecified: Secondary | ICD-10-CM | POA: Diagnosis not present

## 2024-07-10 DIAGNOSIS — I480 Paroxysmal atrial fibrillation: Secondary | ICD-10-CM | POA: Diagnosis not present

## 2024-07-10 DIAGNOSIS — I10 Essential (primary) hypertension: Secondary | ICD-10-CM | POA: Diagnosis not present

## 2024-07-20 DIAGNOSIS — I480 Paroxysmal atrial fibrillation: Secondary | ICD-10-CM | POA: Diagnosis not present

## 2024-07-20 DIAGNOSIS — I10 Essential (primary) hypertension: Secondary | ICD-10-CM | POA: Diagnosis not present

## 2024-08-09 DIAGNOSIS — F3340 Major depressive disorder, recurrent, in remission, unspecified: Secondary | ICD-10-CM | POA: Diagnosis not present

## 2024-08-09 DIAGNOSIS — I480 Paroxysmal atrial fibrillation: Secondary | ICD-10-CM | POA: Diagnosis not present

## 2024-08-09 DIAGNOSIS — I10 Essential (primary) hypertension: Secondary | ICD-10-CM | POA: Diagnosis not present

## 2024-09-02 ENCOUNTER — Telehealth: Payer: Self-pay | Admitting: Cardiovascular Disease

## 2024-09-02 NOTE — Telephone Encounter (Signed)
 If heart rate >110 bpm for 10 minutes would take PRN Diltiazem . Okay to continue present antihypertensive regimen.   Jeraldin Fesler S Amenah Tucci, NP

## 2024-09-02 NOTE — Telephone Encounter (Signed)
 Spoke with patient who stated she is not currently in Afib per Apple watch, no signs of Afib but elevated HR of 115 Had episode of Afib during night Monday night, took Diltiazem  and converted Yesterday HR elevated all day and still elevated today, above 100 both days  Stated she feels fine, doesn't even feel like heart beating fast  She has not had her BP medications this morning, was unsure what to do with the lower blood pressure  Takes Lisinopril  40 mg and hydrochlorothiazide  25 mg every morning and Toprol  25 mg 1/2 tablet in the evening.   Blood pressure 141/85 with no HTN medications so she wasn't sure what she should take   Will forward to Caitlin W for review

## 2024-09-02 NOTE — Telephone Encounter (Signed)
 Advised patient, verbalized understanding   Will call back Friday if no improvement   Message sent to scheduling team to get follow up with Dr Cindie scheduled

## 2024-09-02 NOTE — Telephone Encounter (Signed)
 Pt c/o BP issue: STAT if pt c/o blurred vision, one-sided weakness or slurred speech.  STAT if BP is GREATER than 180/120 TODAY.  STAT if BP is LESS than 90/60 and SYMPTOMATIC TODAY  1. What is your BP concern? hypotension  2. Have you taken any BP medication today? no   3. What are your last 5 BP readings?133/90 HR 118, 118/86 HR 118   4. Are you having any other symptoms (ex. Dizziness, headache, blurred vision, passed out)? No other symptoms

## 2024-09-02 NOTE — Telephone Encounter (Signed)
 Heart rate elevated, suggest inquire if she took her PRN diltiazem  or if she is experiencing palpitations. Elevated heart rate could contribute to lower BP.  If no lightheadedness, dizziness - no need to change antihypertensive regimen.  If persistent hypotensive readings which are symptomatic with lightheadedness, dizziness - reduce hydrochlorothiazide  from 25mg  to 12.5mg  daily and report back BP in 1 week.  Holden Maniscalco S Raywood Wailes, NP

## 2024-09-25 NOTE — Telephone Encounter (Signed)
 Another message sent to EP to get scheduled

## 2024-10-01 NOTE — Telephone Encounter (Signed)
 Spoke w/ patient - she is scheduled on 2/19 w/ EP APP for overdue 6 month follow up.   This is the first message EP has received on this patient.

## 2024-10-08 ENCOUNTER — Telehealth: Payer: Self-pay | Admitting: Student

## 2024-10-08 MED ORDER — APIXABAN 2.5 MG PO TABS: 2.5000 mg | ORAL_TABLET | Freq: Two times a day (BID) | ORAL | 1 refills | Status: AC

## 2024-10-08 NOTE — Telephone Encounter (Signed)
 Refills has been sent to the pharmacy.

## 2024-10-08 NOTE — Telephone Encounter (Signed)
" °*  STAT* If patient is at the pharmacy, call can be transferred to refill team.   1. Which medications need to be refilled? (please list name of each medication and dose if known) apixaban  (ELIQUIS ) 2.5 MG TABS tablet   4. Which pharmacy/location (including street and city if local pharmacy) is medication to be sent to?  [id number] - [place], [place] - [address]     5. Do they need a 30 day or 90 day supply? 90    Pt scheduled 10/29/24 w/ Lesia, PA  "

## 2024-10-12 ENCOUNTER — Telehealth: Payer: Self-pay | Admitting: Student

## 2024-10-12 NOTE — Telephone Encounter (Signed)
 Pt c/o medication issue:  1. Name of Medication:   lisinopril  (ZESTRIL ) 40 MG tablet    2. How are you currently taking this medication (dosage and times per day)?   3. Are you having a reaction (difficulty breathing--STAT)?   4. What is your medication issue?   Patient says her prescription should be for 20 MG, not 40 MG. Please advise.

## 2024-10-12 NOTE — Telephone Encounter (Signed)
 Spoke with the patient who states that she did not increase her lisinopril  to 40 mg per day as advised at her last visit at our office. She has continued to take 20 mg daily. She is now out of her tablets and only has the 40 mg which she is unable to cut in half. She states that she monitors her BP at home and it gets sent to [facility] weekly. She states that she feels like her BP has been fine. She gets occasional readings as high as 150/80. She is needing a refill on lisinopril  20 mg tablets. I advised her to contact her PCP's office who is getting her weekly BP reports and discuss this with them. Patient verbalized understanding and will reach out.

## 2024-10-29 ENCOUNTER — Ambulatory Visit: Admitting: Student
